# Patient Record
Sex: Female | Born: 1935 | Race: White | Hispanic: No | Smoking: Never smoker
Health system: Southern US, Community
[De-identification: ages and names within clinical notes are randomized; demographics above are authoritative.]

## PROBLEM LIST (undated history)

## (undated) DIAGNOSIS — N6009 Solitary cyst of unspecified breast: Secondary | ICD-10-CM

## (undated) DIAGNOSIS — M797 Fibromyalgia: Secondary | ICD-10-CM

## (undated) DIAGNOSIS — I10 Essential (primary) hypertension: Secondary | ICD-10-CM

## (undated) DIAGNOSIS — C50911 Malignant neoplasm of unspecified site of right female breast: Secondary | ICD-10-CM

## (undated) DIAGNOSIS — E039 Hypothyroidism, unspecified: Secondary | ICD-10-CM

## (undated) DIAGNOSIS — M8580 Other specified disorders of bone density and structure, unspecified site: Secondary | ICD-10-CM

## (undated) DIAGNOSIS — E049 Nontoxic goiter, unspecified: Secondary | ICD-10-CM

## (undated) HISTORY — DX: Fibromyalgia: M79.7

## (undated) HISTORY — PX: BREAST EXCISIONAL BIOPSY: SUR124

## (undated) HISTORY — PX: CATARACT EXTRACTION: SUR2

## (undated) HISTORY — DX: Nontoxic goiter, unspecified: E04.9

## (undated) HISTORY — PX: ROTATOR CUFF REPAIR: SHX139

## (undated) HISTORY — DX: Hypothyroidism, unspecified: E03.9

## (undated) HISTORY — DX: Solitary cyst of unspecified breast: N60.09

## (undated) HISTORY — PX: TUBAL LIGATION: SHX77

## (undated) HISTORY — DX: Other specified disorders of bone density and structure, unspecified site: M85.80

---

## 1998-11-12 ENCOUNTER — Other Ambulatory Visit: Admission: RE | Admit: 1998-11-12 | Discharge: 1998-11-12 | Payer: Self-pay | Admitting: Internal Medicine

## 1999-01-25 ENCOUNTER — Encounter (INDEPENDENT_AMBULATORY_CARE_PROVIDER_SITE_OTHER): Payer: Self-pay | Admitting: *Deleted

## 1999-01-25 ENCOUNTER — Ambulatory Visit (HOSPITAL_COMMUNITY): Admission: RE | Admit: 1999-01-25 | Discharge: 1999-01-25 | Payer: Self-pay | Admitting: Gastroenterology

## 2000-05-11 ENCOUNTER — Other Ambulatory Visit: Admission: RE | Admit: 2000-05-11 | Discharge: 2000-05-11 | Payer: Self-pay | Admitting: Internal Medicine

## 2001-06-11 ENCOUNTER — Other Ambulatory Visit: Admission: RE | Admit: 2001-06-11 | Discharge: 2001-06-11 | Payer: Self-pay | Admitting: Internal Medicine

## 2002-08-06 ENCOUNTER — Other Ambulatory Visit: Admission: RE | Admit: 2002-08-06 | Discharge: 2002-08-06 | Payer: Self-pay | Admitting: Internal Medicine

## 2003-04-04 ENCOUNTER — Emergency Department (HOSPITAL_COMMUNITY): Admission: AD | Admit: 2003-04-04 | Discharge: 2003-04-04 | Payer: Self-pay | Admitting: Family Medicine

## 2004-03-09 ENCOUNTER — Ambulatory Visit: Payer: Self-pay | Admitting: Internal Medicine

## 2004-03-24 ENCOUNTER — Ambulatory Visit: Payer: Self-pay | Admitting: Internal Medicine

## 2004-04-06 ENCOUNTER — Ambulatory Visit: Payer: Self-pay | Admitting: Internal Medicine

## 2004-06-07 ENCOUNTER — Ambulatory Visit (HOSPITAL_COMMUNITY): Admission: RE | Admit: 2004-06-07 | Discharge: 2004-06-07 | Payer: Self-pay | Admitting: Gastroenterology

## 2004-09-26 ENCOUNTER — Ambulatory Visit: Payer: Self-pay | Admitting: Internal Medicine

## 2004-11-30 ENCOUNTER — Ambulatory Visit: Payer: Self-pay | Admitting: Internal Medicine

## 2005-01-11 ENCOUNTER — Ambulatory Visit: Payer: Self-pay | Admitting: Endocrinology

## 2005-01-18 ENCOUNTER — Ambulatory Visit (HOSPITAL_COMMUNITY): Admission: RE | Admit: 2005-01-18 | Discharge: 2005-01-18 | Payer: Self-pay | Admitting: Endocrinology

## 2005-01-25 ENCOUNTER — Ambulatory Visit: Payer: Self-pay | Admitting: Endocrinology

## 2005-02-06 ENCOUNTER — Encounter (INDEPENDENT_AMBULATORY_CARE_PROVIDER_SITE_OTHER): Payer: Self-pay | Admitting: Specialist

## 2005-02-06 ENCOUNTER — Other Ambulatory Visit: Admission: RE | Admit: 2005-02-06 | Discharge: 2005-02-06 | Payer: Self-pay | Admitting: Interventional Radiology

## 2005-02-06 ENCOUNTER — Encounter: Admission: RE | Admit: 2005-02-06 | Discharge: 2005-02-06 | Payer: Self-pay | Admitting: Endocrinology

## 2005-02-20 HISTORY — PX: THYROID SURGERY: SHX805

## 2005-05-01 ENCOUNTER — Encounter (INDEPENDENT_AMBULATORY_CARE_PROVIDER_SITE_OTHER): Payer: Self-pay | Admitting: *Deleted

## 2005-05-01 ENCOUNTER — Ambulatory Visit (HOSPITAL_COMMUNITY): Admission: RE | Admit: 2005-05-01 | Discharge: 2005-05-02 | Payer: Self-pay | Admitting: Surgery

## 2005-05-08 ENCOUNTER — Ambulatory Visit: Payer: Self-pay | Admitting: Endocrinology

## 2005-05-29 ENCOUNTER — Ambulatory Visit: Payer: Self-pay | Admitting: Endocrinology

## 2005-07-20 ENCOUNTER — Ambulatory Visit: Payer: Self-pay | Admitting: Internal Medicine

## 2005-07-27 ENCOUNTER — Ambulatory Visit: Payer: Self-pay | Admitting: Internal Medicine

## 2005-08-11 ENCOUNTER — Encounter: Admission: RE | Admit: 2005-08-11 | Discharge: 2005-08-31 | Payer: Self-pay | Admitting: Internal Medicine

## 2005-10-31 ENCOUNTER — Ambulatory Visit: Payer: Self-pay | Admitting: Internal Medicine

## 2005-11-07 ENCOUNTER — Ambulatory Visit: Payer: Self-pay | Admitting: Internal Medicine

## 2005-11-23 ENCOUNTER — Ambulatory Visit: Payer: Self-pay | Admitting: Internal Medicine

## 2006-01-08 LAB — CONVERTED CEMR LAB: Pap Smear: NORMAL

## 2006-02-20 HISTORY — PX: ENDOMETRIAL BIOPSY: SHX622

## 2006-11-29 ENCOUNTER — Encounter: Payer: Self-pay | Admitting: *Deleted

## 2006-11-29 DIAGNOSIS — M949 Disorder of cartilage, unspecified: Secondary | ICD-10-CM

## 2006-11-29 DIAGNOSIS — M899 Disorder of bone, unspecified: Secondary | ICD-10-CM | POA: Insufficient documentation

## 2006-11-29 DIAGNOSIS — E042 Nontoxic multinodular goiter: Secondary | ICD-10-CM | POA: Insufficient documentation

## 2006-11-29 DIAGNOSIS — M797 Fibromyalgia: Secondary | ICD-10-CM | POA: Insufficient documentation

## 2007-02-12 ENCOUNTER — Emergency Department (HOSPITAL_COMMUNITY): Admission: EM | Admit: 2007-02-12 | Discharge: 2007-02-12 | Payer: Self-pay | Admitting: Family Medicine

## 2007-03-12 ENCOUNTER — Ambulatory Visit: Payer: Self-pay | Admitting: Internal Medicine

## 2007-03-12 DIAGNOSIS — E039 Hypothyroidism, unspecified: Secondary | ICD-10-CM | POA: Insufficient documentation

## 2007-03-12 DIAGNOSIS — S139XXA Sprain of joints and ligaments of unspecified parts of neck, initial encounter: Secondary | ICD-10-CM | POA: Insufficient documentation

## 2007-03-12 DIAGNOSIS — R05 Cough: Secondary | ICD-10-CM

## 2007-03-12 DIAGNOSIS — R059 Cough, unspecified: Secondary | ICD-10-CM | POA: Insufficient documentation

## 2007-03-15 LAB — CONVERTED CEMR LAB
ALT: 21 units/L (ref 0–35)
AST: 20 units/L (ref 0–37)
Albumin: 3.7 g/dL (ref 3.5–5.2)
Alkaline Phosphatase: 55 units/L (ref 39–117)
BUN: 12 mg/dL (ref 6–23)
Basophils Absolute: 0 10*3/uL (ref 0.0–0.1)
Basophils Relative: 0.6 % (ref 0.0–1.0)
Bilirubin Urine: NEGATIVE
Bilirubin, Direct: 0.1 mg/dL (ref 0.0–0.3)
CO2: 31 meq/L (ref 19–32)
Calcium: 9.5 mg/dL (ref 8.4–10.5)
Chloride: 105 meq/L (ref 96–112)
Cholesterol: 161 mg/dL (ref 0–200)
Creatinine, Ser: 0.7 mg/dL (ref 0.4–1.2)
Eosinophils Absolute: 0.2 10*3/uL (ref 0.0–0.6)
Eosinophils Relative: 4.3 % (ref 0.0–5.0)
GFR calc Af Amer: 106 mL/min
GFR calc non Af Amer: 88 mL/min
Glucose, Bld: 97 mg/dL (ref 70–99)
HCT: 40.5 % (ref 36.0–46.0)
HDL: 76.2 mg/dL (ref >39.0)
Hemoglobin, Urine: NEGATIVE
Hemoglobin: 14 g/dL (ref 12.0–15.0)
Ketones, ur: NEGATIVE mg/dL
LDL Cholesterol: 70 mg/dL (ref 0–99)
Leukocytes, UA: NEGATIVE
Lymphocytes Relative: 21.4 % (ref 12.0–46.0)
MCHC: 34.5 g/dL (ref 30.0–36.0)
MCV: 90.4 fL (ref 78.0–100.0)
Monocytes Absolute: 0.2 10*3/uL (ref 0.2–0.7)
Monocytes Relative: 6.8 % (ref 3.0–11.0)
Neutro Abs: 2.4 10*3/uL (ref 1.4–7.7)
Neutrophils Relative %: 66.9 % (ref 43.0–77.0)
Nitrite: NEGATIVE
Platelets: 166 10*3/uL (ref 150–400)
Potassium: 4.3 meq/L (ref 3.5–5.1)
RBC: 4.49 M/uL (ref 3.87–5.11)
RDW: 12.1 % (ref 11.5–14.6)
Sodium: 143 meq/L (ref 135–145)
Specific Gravity, Urine: 1.015 (ref 1.000–1.03)
TSH: 4.42 microintl units/mL (ref 0.35–5.50)
Total Bilirubin: 0.8 mg/dL (ref 0.3–1.2)
Total CHOL/HDL Ratio: 2.1
Total Protein, Urine: NEGATIVE mg/dL
Total Protein: 6.6 g/dL (ref 6.0–8.3)
Triglycerides: 72 mg/dL (ref 0–149)
Urine Glucose: NEGATIVE mg/dL
Urobilinogen, UA: 0.2 (ref 0.0–1.0)
VLDL: 14 mg/dL (ref 0–40)
WBC: 3.5 10*3/uL — ABNORMAL LOW (ref 4.5–10.5)
pH: 7 (ref 5.0–8.0)

## 2007-06-17 ENCOUNTER — Telehealth: Payer: Self-pay | Admitting: Internal Medicine

## 2007-07-03 ENCOUNTER — Encounter: Admission: RE | Admit: 2007-07-03 | Discharge: 2007-09-09 | Payer: Self-pay | Admitting: Internal Medicine

## 2007-07-08 ENCOUNTER — Telehealth: Payer: Self-pay | Admitting: Internal Medicine

## 2008-01-15 ENCOUNTER — Telehealth: Payer: Self-pay | Admitting: Internal Medicine

## 2008-03-26 ENCOUNTER — Ambulatory Visit: Payer: Self-pay | Admitting: Internal Medicine

## 2008-03-31 LAB — CONVERTED CEMR LAB
ALT: 19 units/L (ref 0–35)
AST: 20 units/L (ref 0–37)
Albumin: 3.8 g/dL (ref 3.5–5.2)
Alkaline Phosphatase: 61 units/L (ref 39–117)
BUN: 13 mg/dL (ref 6–23)
Basophils Absolute: 0 10*3/uL (ref 0.0–0.1)
Basophils Relative: 0.4 % (ref 0.0–3.0)
Bilirubin Urine: NEGATIVE
Bilirubin, Direct: 0.1 mg/dL (ref 0.0–0.3)
CO2: 31 meq/L (ref 19–32)
Calcium: 9.4 mg/dL (ref 8.4–10.5)
Chloride: 108 meq/L (ref 96–112)
Cholesterol: 138 mg/dL (ref 0–200)
Creatinine, Ser: 0.6 mg/dL (ref 0.4–1.2)
Eosinophils Absolute: 0.2 10*3/uL (ref 0.0–0.7)
Eosinophils Relative: 5.3 % — ABNORMAL HIGH (ref 0.0–5.0)
GFR calc Af Amer: 127 mL/min
GFR calc non Af Amer: 105 mL/min
Glucose, Bld: 95 mg/dL (ref 70–99)
HCT: 41 % (ref 36.0–46.0)
HDL: 64.4 mg/dL (ref >39.0)
Hemoglobin, Urine: NEGATIVE
Hemoglobin: 13.9 g/dL (ref 12.0–15.0)
Ketones, ur: NEGATIVE mg/dL
LDL Cholesterol: 60 mg/dL (ref 0–99)
Leukocytes, UA: NEGATIVE
Lymphocytes Relative: 25 % (ref 12.0–46.0)
MCHC: 33.9 g/dL (ref 30.0–36.0)
MCV: 91.5 fL (ref 78.0–100.0)
Monocytes Absolute: 0.3 10*3/uL (ref 0.1–1.0)
Monocytes Relative: 7.4 % (ref 3.0–12.0)
Neutro Abs: 2.1 10*3/uL (ref 1.4–7.7)
Neutrophils Relative %: 61.9 % (ref 43.0–77.0)
Nitrite: NEGATIVE
Platelets: 142 10*3/uL — ABNORMAL LOW (ref 150–400)
Potassium: 3.9 meq/L (ref 3.5–5.1)
RBC: 4.48 M/uL (ref 3.87–5.11)
RDW: 12.2 % (ref 11.5–14.6)
Sed Rate: 11 mm/hr (ref 0–22)
Sodium: 144 meq/L (ref 135–145)
Specific Gravity, Urine: 1.01 (ref 1.000–1.03)
TSH: 3.55 microintl units/mL (ref 0.35–5.50)
Total Bilirubin: 0.6 mg/dL (ref 0.3–1.2)
Total CHOL/HDL Ratio: 2.1
Total Protein, Urine: NEGATIVE mg/dL
Total Protein: 6.6 g/dL (ref 6.0–8.3)
Triglycerides: 66 mg/dL (ref 0–149)
Urine Glucose: NEGATIVE mg/dL
Urobilinogen, UA: 0.2 (ref 0.0–1.0)
VLDL: 13 mg/dL (ref 0–40)
WBC: 3.5 10*3/uL — ABNORMAL LOW (ref 4.5–10.5)
pH: 7 (ref 5.0–8.0)

## 2008-04-02 ENCOUNTER — Encounter: Payer: Self-pay | Admitting: Internal Medicine

## 2008-04-02 LAB — CONVERTED CEMR LAB: Vit D, 1,25-Dihydroxy: 23 — ABNORMAL LOW (ref 30–89)

## 2008-04-14 ENCOUNTER — Encounter: Admission: RE | Admit: 2008-04-14 | Discharge: 2008-04-14 | Payer: Self-pay | Admitting: Internal Medicine

## 2008-07-27 ENCOUNTER — Telehealth: Payer: Self-pay | Admitting: Internal Medicine

## 2009-02-15 ENCOUNTER — Telehealth: Payer: Self-pay | Admitting: Internal Medicine

## 2009-04-19 ENCOUNTER — Telehealth: Payer: Self-pay | Admitting: Internal Medicine

## 2009-04-26 ENCOUNTER — Ambulatory Visit: Payer: Self-pay | Admitting: Internal Medicine

## 2009-04-26 DIAGNOSIS — R634 Abnormal weight loss: Secondary | ICD-10-CM | POA: Insufficient documentation

## 2009-04-29 ENCOUNTER — Ambulatory Visit: Payer: Self-pay | Admitting: Internal Medicine

## 2009-08-18 ENCOUNTER — Encounter: Admission: RE | Admit: 2009-08-18 | Discharge: 2009-08-18 | Payer: Self-pay | Admitting: Internal Medicine

## 2009-08-18 LAB — HM MAMMOGRAPHY

## 2010-03-20 LAB — CONVERTED CEMR LAB
ALT: 27 units/L (ref 0–35)
AST: 22 units/L (ref 0–37)
Albumin: 4 g/dL (ref 3.5–5.2)
Alkaline Phosphatase: 62 units/L (ref 39–117)
BUN: 9 mg/dL (ref 6–23)
Basophils Absolute: 0 10*3/uL (ref 0.0–0.1)
Basophils Relative: 0.7 % (ref 0.0–3.0)
Bilirubin Urine: NEGATIVE
Bilirubin, Direct: 0.2 mg/dL (ref 0.0–0.3)
CO2: 32 meq/L (ref 19–32)
Calcium: 9.5 mg/dL (ref 8.4–10.5)
Chloride: 107 meq/L (ref 96–112)
Cholesterol: 170 mg/dL (ref 0–200)
Creatinine, Ser: 0.7 mg/dL (ref 0.4–1.2)
Eosinophils Absolute: 0.1 10*3/uL (ref 0.0–0.7)
Eosinophils Relative: 3.6 % (ref 0.0–5.0)
GFR calc non Af Amer: 87.18 mL/min (ref >60)
Glucose, Bld: 93 mg/dL (ref 70–99)
HCT: 42.2 % (ref 36.0–46.0)
HDL: 90.9 mg/dL (ref >39.00)
Hemoglobin, Urine: NEGATIVE
Hemoglobin: 13.8 g/dL (ref 12.0–15.0)
Ketones, ur: NEGATIVE mg/dL
LDL Cholesterol: 72 mg/dL (ref 0–99)
Leukocytes, UA: NEGATIVE
Lymphocytes Relative: 23.3 % (ref 12.0–46.0)
Lymphs Abs: 0.8 10*3/uL (ref 0.7–4.0)
MCHC: 32.8 g/dL (ref 30.0–36.0)
MCV: 92.5 fL (ref 78.0–100.0)
Monocytes Absolute: 0.3 10*3/uL (ref 0.1–1.0)
Monocytes Relative: 7.5 % (ref 3.0–12.0)
Neutro Abs: 2.2 10*3/uL (ref 1.4–7.7)
Neutrophils Relative %: 64.9 % (ref 43.0–77.0)
Nitrite: NEGATIVE
Platelets: 154 10*3/uL (ref 150.0–400.0)
Potassium: 4.3 meq/L (ref 3.5–5.1)
RBC: 4.56 M/uL (ref 3.87–5.11)
RDW: 12 % (ref 11.5–14.6)
Sodium: 143 meq/L (ref 135–145)
Specific Gravity, Urine: 1.01 (ref 1.000–1.030)
TSH: 3.17 microintl units/mL (ref 0.35–5.50)
Total Bilirubin: 0.7 mg/dL (ref 0.3–1.2)
Total CHOL/HDL Ratio: 2
Total Protein, Urine: NEGATIVE mg/dL
Total Protein: 6.9 g/dL (ref 6.0–8.3)
Triglycerides: 38 mg/dL (ref 0.0–149.0)
Urine Glucose: NEGATIVE mg/dL
Urobilinogen, UA: 0.2 (ref 0.0–1.0)
VLDL: 7.6 mg/dL (ref 0.0–40.0)
WBC: 3.4 10*3/uL — ABNORMAL LOW (ref 4.5–10.5)
pH: 7 (ref 5.0–8.0)

## 2010-03-22 NOTE — Progress Notes (Signed)
Summary: Synthroid  Phone Note Refill Request Message from:  Fax from Pharmacy on April 19, 2009 2:28 PM  Refills Requested: Medication #1:  SYNTHROID 75 MCG  TABS Take 1 tablet by mouth once a day Next Appointment Scheduled: 04-26-09 Initial call taken by: Lucious Groves,  April 19, 2009 2:28 PM    Prescriptions: SYNTHROID 75 MCG  TABS (LEVOTHYROXINE SODIUM) Take 1 tablet by mouth once a day  #30 x 3   Entered by:   Lucious Groves   Authorized by:   Tresa Garter MD   Signed by:   Lucious Groves on 04/19/2009   Method used:   Electronically to        [facility] [id number]       [address]       [place], [place]  [address]       Ph: [phone] or [phone]       Fax: [phone]   RxID:   [id number]

## 2010-03-22 NOTE — Assessment & Plan Note (Signed)
Summary: SHINGLE SHOT/PN/PER PLOT NURSE  Nurse Visit   Allergies: 1)  ! Morphine  Immunizations Administered:  Zostavax # 1:    Vaccine Type: Zostavax    Site: left deltoid    Mfr: Merck    Dose: 0.5 ml    Route: [place]    Given by: Tora Perches    Exp. Date: 01/18/2011    Lot #: [id number]    VIS given: 12/02/04 given April 29, 2009.  Orders Added: 1)  Zoster (Shingles) Vaccine Live [90736] 2)  Admin 1st Vaccine [phone]

## 2010-03-22 NOTE — Assessment & Plan Note (Signed)
Summary: YEARLY FU/ LABS AFTER/ MEDICARE/NWS   Vital Signs:  Patient profile:   76 year old female Height:      62 inches Weight:      125 pounds BMI:     22.95 Temp:     98.5 degrees F oral Pulse rate:   85 / minute BP sitting:   132 / 80  (left arm)  Vitals Entered By: Tora Perches (April 26, 2009 8:41 AM) CC: cpx Is Patient Diabetic? No Comments pt state she  has not had a pap in 3 years,  feels she should have one/vg   CC:  cpx.  History of Present Illness: The patient presents for a wellness examination  Lost wt  after a stomach virus in Dec - all family was sick  Preventive Screening-Counseling & Management  Alcohol-Tobacco     Smoking Status: never  Allergies: 1)  ! 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  The patient denies anorexia, fever, weight gain, vision loss, decreased hearing, hoarseness, chest pain, syncope, dyspnea on exertion, peripheral edema, prolonged cough, headaches, hemoptysis, abdominal pain, melena, hematochezia, severe indigestion/heartburn, hematuria, incontinence, genital sores, muscle weakness, suspicious skin lesions, transient blindness, difficulty walking, depression, unusual weight change, abnormal bleeding, enlarged lymph nodes, angioedema, and breast masses.    Physical Exam  General:  Well-developed,well-nourished,in no acute distress; alert,appropriate and cooperative throughout examination Head:  Normocephalic and atraumatic without obvious  abnormalities. No apparent alopecia or balding. Eyes:  No corneal or conjunctival inflammation noted. EOMI. Perrla. l. Ears:  External ear exam shows no significant lesions or deformities.  Otoscopic examination reveals clear canals, tympanic membranes are intact bilaterally without bulging, retraction, inflammation or discharge. Hearing is grossly normal bilaterally. Nose:  External nasal examination shows no deformity or inflammation. Nasal mucosa are pink and moist without lesions or exudates. Mouth:  Oral mucosa and oropharynx without lesions or exudates.  Teeth in good repair. Lungs:  Normal respiratory effort, chest expands symmetrically. Lungs are clear to auscultation, no crackles or wheezes. Heart:  Normal rate and regular rhythm. S1 and S2 normal without gallop, murmur, click, rub or other extra sounds. Abdomen:  Bowel sounds positive,abdomen soft and non-tender without masses, organomegaly or hernias noted. Msk:  No deformity or scoliosis noted of thoracic or lumbar spine.   Extremities:  No clubbing, cyanosis, edema, or deformity noted with normal full range of motion of all joints.   Neurologic:  No cranial nerve deficits noted. Station and gait are normal. Plantar reflexes are down-going bilaterally. DTRs are symmetrical throughout. Sensory, motor and coordinative functions appear intact. Skin:  Intact without suspicious lesions or rashes Psych:  Cognition and judgment appear intact. Alert and cooperative with normal attention span and concentration. No apparent delusions, illusions, hallucinations   Impression & Recommendations:  Problem # 1:  WELL ADULT EXAM (ICD-V70.0) Assessment New Health and age related issues were discussed. Available screening tests and vaccinations were discussed as well. Healthy life style including good diet and execise was discussed.  Orders: EKG w/  Interpretation (93000) TLB-BMP (Basic Metabolic Panel-BMET) (80048-METABOL) TLB-CBC Platelet -  w/Differential (85025-CBCD) TLB-Hepatic/Liver Function Pnl (80076-HEPATIC) TLB-Lipid Panel (80061-LIPID) TLB-TSH (Thyroid Stimulating Hormone) (84443-TSH) TLB-Udip ONLY (81003-UDIP)  Problem # 2:  WEIGHT LOSS (ICD-783.21) Assessment: New Poss related too an acute GI ilness. Will watch  Problem # 3:  HYPOTHYROIDISM (ICD-244.9) Assessment: Comment Only  Her updated medication list for this problem includes:    Synthroid 75 Mcg Tabs (Levothyroxine sodium) .[name]... Take 1 tablet by mouth once a day  Problem # 4:  FIBROMYALGIA (ICD-729.1)  Complete Medication List: 1)  Synthroid 75 Mcg Tabs (Levothyroxine sodium) .... Take 1 tablet by mouth once a day 2)  Ativan 1 Mg Tabs (Lorazepam) .... Take 1 tablet by mouth two times a day as needed 3)  Vitamin D3 1000 Unit Tabs (Cholecalciferol) .[name].. 1 qd 4)  Zostavax 16109 Unt/0.9ml Solr (Zoster vaccine live) .... As directed, to be administered by pharmacist or at dr's office  Patient Instructions: 1)  Please schedule a follow-up appointment in 3 months. Prescriptions: ATIVAN 1 MG  TABS (LORAZEPAM) Take 1 tablet by mouth two times a day as needed  #60 x 6   Entered and Authorized by:   Tresa Garter MD   Signed by:   Tresa Garter MD on 04/26/2009   Method used:   Print then Give to Patient   RxID:   [id number] SYNTHROID 75 MCG  TABS (LEVOTHYROXINE SODIUM) Take 1 tablet by mouth once a day  #90 x 3   Entered and Authorized by:   Tresa Garter MD   Signed by:   Tresa Garter MD on 04/26/2009   Method used:   Print then Give to Patient   RxID:   [id number]

## 2010-05-10 ENCOUNTER — Other Ambulatory Visit: Payer: Self-pay | Admitting: *Deleted

## 2010-05-10 MED ORDER — LEVOTHYROXINE SODIUM 75 MCG PO TABS: 75.0000 ug | ORAL_TABLET | Freq: Every day | ORAL | Status: DC

## 2010-05-12 ENCOUNTER — Other Ambulatory Visit (INDEPENDENT_AMBULATORY_CARE_PROVIDER_SITE_OTHER): Payer: Medicare Other | Admitting: Internal Medicine

## 2010-05-12 ENCOUNTER — Other Ambulatory Visit (INDEPENDENT_AMBULATORY_CARE_PROVIDER_SITE_OTHER): Payer: Medicare Other

## 2010-05-12 ENCOUNTER — Ambulatory Visit (INDEPENDENT_AMBULATORY_CARE_PROVIDER_SITE_OTHER): Payer: Medicare Other | Admitting: Internal Medicine

## 2010-05-12 ENCOUNTER — Encounter: Payer: Self-pay | Admitting: Internal Medicine

## 2010-05-12 ENCOUNTER — Telehealth: Payer: Self-pay | Admitting: Internal Medicine

## 2010-05-12 DIAGNOSIS — M8580 Other specified disorders of bone density and structure, unspecified site: Secondary | ICD-10-CM

## 2010-05-12 DIAGNOSIS — IMO0001 Reserved for inherently not codable concepts without codable children: Secondary | ICD-10-CM

## 2010-05-12 DIAGNOSIS — M25522 Pain in left elbow: Secondary | ICD-10-CM

## 2010-05-12 DIAGNOSIS — G4700 Insomnia, unspecified: Secondary | ICD-10-CM

## 2010-05-12 DIAGNOSIS — E039 Hypothyroidism, unspecified: Secondary | ICD-10-CM

## 2010-05-12 DIAGNOSIS — M797 Fibromyalgia: Secondary | ICD-10-CM

## 2010-05-12 DIAGNOSIS — Z0000 Encounter for general adult medical examination without abnormal findings: Secondary | ICD-10-CM

## 2010-05-12 DIAGNOSIS — M899 Disorder of bone, unspecified: Secondary | ICD-10-CM

## 2010-05-12 DIAGNOSIS — M25529 Pain in unspecified elbow: Secondary | ICD-10-CM

## 2010-05-12 LAB — CBC WITH DIFFERENTIAL/PLATELET
Basophils Absolute: 0 10*3/uL (ref 0.0–0.1)
Basophils Relative: 0.3 % (ref 0.0–3.0)
Eosinophils Absolute: 0.1 10*3/uL (ref 0.0–0.7)
Eosinophils Relative: 3.2 % (ref 0.0–5.0)
HCT: 40.8 % (ref 36.0–46.0)
Hemoglobin: 14.1 g/dL (ref 12.0–15.0)
Lymphocytes Relative: 24.4 % (ref 12.0–46.0)
Lymphs Abs: 0.9 10*3/uL (ref 0.7–4.0)
MCHC: 34.5 g/dL (ref 30.0–36.0)
MCV: 90.6 fl (ref 78.0–100.0)
Monocytes Absolute: 0.3 10*3/uL (ref 0.1–1.0)
Monocytes Relative: 7.8 % (ref 3.0–12.0)
Neutro Abs: 2.3 10*3/uL (ref 1.4–7.7)
Neutrophils Relative %: 64.3 % (ref 43.0–77.0)
Platelets: 146 10*3/uL — ABNORMAL LOW (ref 150.0–400.0)
RBC: 4.5 Mil/uL (ref 3.87–5.11)
RDW: 13.2 % (ref 11.5–14.6)
WBC: 3.6 10*3/uL — ABNORMAL LOW (ref 4.5–10.5)

## 2010-05-12 LAB — HEPATIC FUNCTION PANEL
ALT: 23 U/L (ref 0–35)
AST: 21 U/L (ref 0–37)
Albumin: 4 g/dL (ref 3.5–5.2)
Alkaline Phosphatase: 59 U/L (ref 39–117)
Bilirubin, Direct: 0.1 mg/dL (ref 0.0–0.3)
Total Bilirubin: 0.7 mg/dL (ref 0.3–1.2)
Total Protein: 6.7 g/dL (ref 6.0–8.3)

## 2010-05-12 LAB — BASIC METABOLIC PANEL
BUN: 14 mg/dL (ref 6–23)
CO2: 31 mEq/L (ref 19–32)
Calcium: 9.3 mg/dL (ref 8.4–10.5)
Chloride: 103 mEq/L (ref 96–112)
Creatinine, Ser: 0.6 mg/dL (ref 0.4–1.2)
GFR: 98.17 mL/min (ref >60.00)
Glucose, Bld: 87 mg/dL (ref 70–99)
Potassium: 4.4 mEq/L (ref 3.5–5.1)
Sodium: 139 mEq/L (ref 135–145)

## 2010-05-12 LAB — TSH: TSH: 2.65 u[IU]/mL (ref 0.35–5.50)

## 2010-05-12 LAB — LIPID PANEL
Cholesterol: 175 mg/dL (ref 0–200)
HDL: 86.3 mg/dL (ref >39.00)
LDL Cholesterol: 76 mg/dL (ref 0–99)
Total CHOL/HDL Ratio: 2
Triglycerides: 62 mg/dL (ref 0.0–149.0)
VLDL: 12.4 mg/dL (ref 0.0–40.0)

## 2010-05-12 LAB — URINALYSIS
Bilirubin Urine: NEGATIVE
Hgb urine dipstick: NEGATIVE
Ketones, ur: NEGATIVE
Leukocytes, UA: NEGATIVE
Nitrite: NEGATIVE
Specific Gravity, Urine: <=1.005 (ref 1.000–1.030)
Total Protein, Urine: NEGATIVE
Urine Glucose: NEGATIVE
Urobilinogen, UA: 0.2 (ref 0.0–1.0)
pH: 7 (ref 5.0–8.0)

## 2010-05-12 MED ORDER — LEVOTHYROXINE SODIUM 75 MCG PO TABS: 75.0000 ug | ORAL_TABLET | Freq: Every day | ORAL | Status: DC

## 2010-05-12 MED ORDER — IBUPROFEN 600 MG PO TABS: ORAL_TABLET | ORAL | Status: AC

## 2010-05-12 MED ORDER — LORAZEPAM 1 MG PO TABS: 1.0000 mg | ORAL_TABLET | Freq: Two times a day (BID) | ORAL | Status: DC | PRN

## 2010-05-12 MED ORDER — IBUPROFEN 600 MG PO TABS: ORAL_TABLET | ORAL | Status: DC

## 2010-05-12 NOTE — Progress Notes (Signed)
Subjective:    Patient ID: Lindsay Maldonado, female    DOB: 1934-02-27, 76 y.o.   MRN: [id number]  HPI  The patient is here for annual Medicare wellness examination and management of other chronic and acute problems.  The risk factors are reflected in the social history. The roster all physicians providing medical care to patient - please, see in the Snapshot section of the chart.  Activities of daily living:  The patient is able 2 prepare food and peds date getting dressed using the toilet morning around from place to place without assistance.     Home safety :    The patient has smoke detectors and wears seatbelts. No firearms at home. No excess sun exposure.      Diet and exercise:  The patient  does exercise.  The need to exercise regularly was discussed. Dietary issues discussed. Healthy diet discussed.      Depression screen :  Over the past 2 weeks, the patient has not felt down, depressed or hopeless. Over the past 2 weeks, the patient hasn't felt little interest or pleasure in doing things.      Past surgical history and problem list:  The following portions of the patient's history were reviewed and updated as appropriate:  allergies, current medications, past family history, past medical history,  past surgical history, past social history  and problem list.  Vision, hearing, body mass index were assessed and reviewed. Cognitive impairment assessment revealed cognition, memory and judgment appear normal.   During the course of the visit the patient was educated and counseled about appropriate screening and preventive services including : fall prevention , diabetes screening, nutrition counseling, vaccination.    C/o L elbow pain x weeks worse with lifting C/o FMS and difficulties sleeping  Review of Systems  Constitutional: Negative.  Negative for fever, chills, diaphoresis, activity change, appetite change, fatigue and unexpected weight change.  HENT: Negative  for hearing loss, ear pain, nosebleeds, congestion, sore throat, facial swelling, rhinorrhea, sneezing, mouth sores, trouble swallowing, neck pain, neck stiffness, postnasal drip, sinus pressure and tinnitus.   Eyes: Negative for pain, discharge, redness, itching and visual disturbance.  Respiratory: Negative for cough, chest tightness, shortness of breath, wheezing and stridor.   Cardiovascular: Negative for chest pain, palpitations and leg swelling.  Gastrointestinal: Negative for nausea, diarrhea, constipation, blood in stool, abdominal distention, anal bleeding and rectal pain.  Genitourinary: Negative for dysuria, urgency, frequency, hematuria, flank pain, vaginal bleeding, vaginal discharge, difficulty urinating, genital sores and pelvic pain.       Menopausal  Musculoskeletal: Positive for myalgias. Negative for back pain, joint swelling, arthralgias and gait problem.  Skin: Negative.  Negative for rash.  Neurological: Negative for dizziness, tremors, seizures, syncope, speech difficulty, weakness, numbness and headaches.  Hematological: Negative for adenopathy. Does not bruise/bleed easily.  Psychiatric/Behavioral: Negative for suicidal ideas, behavioral problems, sleep disturbance, dysphoric mood and decreased concentration. The patient is not nervous/anxious.        Objective:   Physical Exam  Constitutional: She appears well-developed and well-nourished. No distress.  HENT:  Head: Normocephalic.  Right Ear: External ear normal.  Left Ear: External ear normal.  Nose: Nose normal.  Mouth/Throat: Oropharynx is clear and moist.  Eyes: Conjunctivae are normal. Pupils are equal, round, and reactive to light. Right eye exhibits no discharge. Left eye exhibits no discharge.  Neck: Normal range of motion. Neck supple. No JVD present. No tracheal deviation present. No thyromegaly present.  Cardiovascular: Normal rate, regular rhythm  and normal heart sounds.   Pulmonary/Chest: No stridor.  No respiratory distress. She has no wheezes.  Abdominal: Soft. Bowel sounds are normal. She exhibits no distension and no mass. There is no tenderness. There is no rebound and no guarding.  Musculoskeletal: She exhibits tenderness. She exhibits no edema.       L lat epicondyl is tender  Lymphadenopathy:    She has no cervical adenopathy.  Neurological: She displays normal reflexes. No cranial nerve deficit. She exhibits normal muscle tone. Coordination normal.  Skin: No rash noted. No erythema.  Psychiatric: She has a normal mood and affect. Her behavior is normal. Judgment and thought content normal.          Assessment & Plan:    Elbow pain, left Tennis elbow - new. Treatment options discussed Ibuprofen - see Rx. Will inject if not better. Hypothyroidism On meds  Fibromyalgia She refused to try Flexeril or Norrtriptyline at night due to" hangover" type of side effects in the past  Medicare annual wellness visit, subsequent We have discussed and reviewed health maintenance relevant issues

## 2010-05-12 NOTE — Assessment & Plan Note (Signed)
We can try Flexeril or Norrtriptyline at night

## 2010-05-12 NOTE — Assessment & Plan Note (Signed)
We have discussed and reviewed health maintenance relevant issues

## 2010-05-12 NOTE — Telephone Encounter (Deleted)
A for an

## 2010-05-12 NOTE — Assessment & Plan Note (Signed)
Tennis elbow - new. Treatment options discussed

## 2010-05-12 NOTE — Assessment & Plan Note (Signed)
On meds

## 2010-05-12 NOTE — Telephone Encounter (Signed)
Labs reviewed. See note

## 2010-05-13 ENCOUNTER — Telehealth: Payer: Self-pay | Admitting: Internal Medicine

## 2010-05-13 LAB — VITAMIN D 25 HYDROXY (VIT D DEFICIENCY, FRACTURES): Vit D, 25-Hydroxy: 50 ng/mL (ref 30–89)

## 2010-05-13 NOTE — Telephone Encounter (Signed)
The lab work looks good please female the patient a copy of her lab work                              thank you

## 2010-05-13 NOTE — Telephone Encounter (Signed)
Please, let her know labs were nl

## 2010-05-16 NOTE — Telephone Encounter (Signed)
I meant "mail", sorry.Marland KitchenMarland Kitchen

## 2010-05-17 NOTE — Telephone Encounter (Signed)
Copies mailed

## 2010-07-08 NOTE — Op Note (Signed)
[place]. [facility]  Patient:    Lindsay Maldonado                     MRN: [id number] Proc. Date: 01/25/99 Adm. Date:  [id number] Attending:  Nelda Marseille CC:         Petra Kuba, M.D.             Lenon Curt Chilton Si, MD                           Operative Report  PROCEDURE:  Colonoscopy.  INDICATIONS:  Chronic constipation due for colonic screening.  Consent was signed after risks, benefits, medicines and options thoroughly discussed in the office.  MEDICINES USED:  Demerol 40, versed 5.  PROCEDURE:  Rectal inspection is pertinent for small external hemorrhoids. Digital exam was negative.  Pediatric video colonoscope was inserted and fairly easily advanced around the colon to the cecum.  This did require some abdominal pressure but no position changes.  The cecum was identified by the appendiceal orifice in the ileocecal valve.  On slow withdrawal through the colon no abnormalities were seen.  The colon was slightly tortuous particularly in the sigmoid but no polypoid lesions, masses, diverticula or other abnormalities were seen.  Once back in the rectum the scope was retroflexed and pertinent for some internal hemorrhoids.  Scope was straightened and the ______ withdrawn and the scope removed.  The patient tolerated the procedure well.  There was no obvious immediate complication.  ENDOSCOPIC DIAGNOSES: 1. Small internal and external hemorrhoids. 2. Otherwise normal exam to the cecum.  PLAN:  Daily rectals and guaiacs per Dr. Chilton Si.  Continue workup with an esophagogastroduodenoscopy.  Repeat screening in 5-10 years unless symptoms or recommendations change. DD:  01/25/99 TD:  01/26/99 Job: [id number]

## 2010-07-08 NOTE — Op Note (Signed)
NAMECHARLCIE, Lindsay Maldonado             ACCOUNT NO.:  [id number]   MEDICAL RECORD NO.:  [id number]          PATIENT TYPE:  OIB   LOCATION:  1005                         FACILITY[facility]  [facility]   PHYSICIAN:  Lindsay Maldonado, M.D.DATE OF BIRTH:  Sep 16, 1934   DATE OF PROCEDURE:  05/01/2005  DATE OF DISCHARGE:  05/02/2005                                 OPERATIVE REPORT   OFFICE MEDICAL RECORD NUMBER:  [id number].   PREOPERATIVE DIAGNOSIS:  Bilateral thyroid nodules, both showing follicular  lesions on FNA.   OPERATIONS:  Total thyroidectomy.   SURGEON:  Dr. Jamey Ripa   ASSISTANT:  Dr. Abbey Chatters   ANESTHESIA:  General endotracheal.   CLINICAL HISTORY:  Lindsay Maldonado has multiple thyroid nodules, some of which  have calcified, and biopsies have shown follicular lesions.  It was elected  to proceed to total thyroidectomy after a lengthy discussion with the  patient and consultations with her endocrinologist, Dr. Everardo Maldonado.   The patient confirmed the plans for surgery.   She was taken to the operating room and after satisfactory general  endotracheal anesthesia had been obtained, the neck was prepped and draped  as a sterile field.  A transverse incision was outlined using 2-0 silk.  The  cervical incision was made, deepened through the platysma.  Sub-platysma  flaps were raised in the usual fashion.  A self-retaining retractor was  placed.   Prethyroid fascia was opened in the midline.  The right lobe of the thyroid  was exposed with some blunt dissection.  The middle vein was identified,  clipped, and divided.  A couple of tiny vessels coming into the inferior  pole were divided using clips.   I then mobilized the superior pole.  I was able to get just around it right  as the vessels were coming in, divided that staying very close so we did not  catch any extra tissue here.  This was Maldonado ligated in continuity 2-0 silk  and clipped.   With the thyroid rotated medially, I was able to dissect  along the capsule.  I identified what appeared to be both parathyroids and retracted them  laterally.  I stayed right on the capsule and divided small vessels with  clips.  As we were able to dissect in the area of the recurrent laryngeal  nerve, I identified it, and we traced it coming in in its normal location.  It was carefully avoided and appeared intact once we had finished this  portion of the thyroidectomy.   Once it was identified, I was able to divide the remaining small attachments  to the thyroid gland, again using clips to control bleeding.  We then used  the cautery to divide the thyroid off the trachea until I got across the  isthmus.   I placed a little pack in that area of the neck and approached the left  side.  The nodules on the left side were larger, but the thyroidectomy was  approached in the same fashion as on the right.  Again, the vessels of the  superior pole were ligated individually with clips here.  The  nerve was  identified and preserved.  I was not completely clear that I saw 2  parathyroids on this side, but we did see at least what we thought was the  inferior and retracted it laterally.  There is no evidence of any  parathyroid tissue on any of the thyroid that we removed.   Once this was done, we irrigated and made sure everything was dry.  I left a  little FloSeal and Surgicel, both right around the area of the nerve, as  there was some oozing on both sides, but we had gotten that stopped before  applying the FloSeal, but I felt this would be just extra security.   Once everything appeared to be dry, the incision was closed using some 4-0  Vicryl to close the midline fascia, 4-0 Vicryl on Scarpa's, and some staples  and Steri-Strips on the skin.   The patient tolerated the procedure well.  There no operative complications.  Maldonado counts were correct.      Lindsay Maldonado, M.D.  Electronically Signed     CJS/MEDQ  D:  05/01/2005  T:   05/02/2005  Job:  [id number]   cc:   Lindsay Maldonado, M.D. [facility]  [address]  [place]  [place] [address]   Lindsay Maldonado, M.D. [facility]  [address]  [place]  [place] [address]

## 2010-07-08 NOTE — Op Note (Signed)
Lindsay Maldonado, Lindsay Maldonado             ACCOUNT NO.:  [id number]   MEDICAL RECORD NO.:  [id number]          PATIENT TYPE:  AMB   LOCATION:  ENDO                         FACILITY:  [facility]   PHYSICIAN:  Petra Kuba, M.D.    DATE OF BIRTH:  14-Sep-1934   DATE OF PROCEDURE:  06/07/2004  DATE OF DISCHARGE:                                 OPERATIVE REPORT   PROCEDURE PERFORMED:  Colonoscopy.   INDICATIONS:  Screening. Consent was signed after risks, benefits, methods,  options thoroughly discussed in the office in the past and with my nurse.   MEDICINES USED:  Demerol 60, Versed 6.   PROCEDURE:  Rectal inspection is pertinent for external hemorrhoids, small.  Digital exam was negative. Video pediatric adjustable colonoscope was  inserted and fairly easily advanced around the colon to the cecum. This did  require some abdominal pressure but no position changes.  Other than a rare  early left-sided diverticula, no abnormalities were seen.  The cecum was  identified by the appendiceal orifice and ileocecal valve.  If fact, the  scope was inserted a short ways into the terminal ileum which was normal.  Photodocumentation was obtained. Scope was slowly withdrawn. Prep was  adequate. There is some liquid stool that required washing and suction.  On  slow withdrawal through the colon other than the rare early left-sided  diverticula seen on insertion no other abnormalities were seen, specifically  no polyps, tumors or masses. Once back in the rectum, anorectal pull-through  and retroflexion confirmed some small hemorrhoids. Scope was straightened  readvanced a short ways up the left side of the colon. Air was suctioned,  scope removed. The patient tolerated the procedure well. There was no  obvious immediate complication.   ENDOSCOPIC DIAGNOSIS:  1.  Internal-external hemorrhoids.  2.  Rare early left-sided diverticula.  3.  Otherwise within normal limits to the terminal ileum.   PLAN:  Will  recheck colon screening in five to 10 years. Happy to see back  p.r.n.  Otherwise return care to Dr. Posey Rea for the customary health care  maintenance to include yearly rectals and guaiacs.      MEM/MEDQ  D:  06/07/2004  T:  06/07/2004  Job:  [id number]   cc:   Georgina Quint. Plotnikov, M.D. [facility]

## 2010-07-08 NOTE — Procedures (Signed)
[place]. [facility]  Patient:    Lindsay Maldonado                     MRN: [id number] Proc. Date: 01/25/99 Adm. Date:  [id number] Attending:  Nelda Marseille Dictator:   Petra Kuba, M.D. CC:         Lenon Curt. Chilton Si, MD             Doris Cheadle. Lyman Bishop, M.D.                           Procedure Report  PROCEDURE:  Esophagogastroduodenoscopy with biopsy.  INDICATIONS:  The patient with atypical cough rule out reflux as an etiology.  The consent was signed after risks, benefits, methods no acute distress options  were discussed before any premeds given.  No additional medications for this procedure were given since it followed her colonoscopy.  PROCEDURE:  The video endoscope was inserted by direct vision.  The esophagus was fairly unremarkable.  Possibly there was some very minimal reflux change and the distal esophagus was widely patent GE junction.  A small hiatal hernia was noted. The scope was passed through the stomach and then through normal antrum, normal  pylorus and normal duodenal bulb and around the second portion of the duodenum.  The scope was slowly withdrawn back to the bulb and a good look down ruled out ny abnormalities.  The scope was withdrawn back to the stomach and retroflexed. High in the cardia, the hiatal hernia was confirmed.  The fundus, angularis, lesser nd greater curve were evaluated on retroflexed and then straight visualization without additional findings.  The air was suctioned.  The scope was slowly withdrawn back to 20 cm.  Again essentially normal esophagus with maybe some mild reflux change was obtained.  Some distal esophageal biopsies were obtained to rule out any microscopic reflux.  The scope was then slowly withdrawn.  The patient tolerated the procedure well.  There was no immediate complications.  ENDOSCOPIC DIAGNOSIS: 1. Small hiatal hernia. 2. Normal esophagus except for some mild reflux  changes status post    biopsy. 3. Otherwise normal esophagogastroduodenoscopy.  PLAN: Trial of Prilosec 40 mg.  We will see her back in six weeks to see if that is helpful.  Possibly even add Reglan or increase back to her regimen.  Possibly will need 24-hour pH to confirm the diagnosis.  Have her call me sooner or p.r.n. DD:  01/25/99 TD:  01/26/99 Job: [id number]

## 2010-07-08 NOTE — Assessment & Plan Note (Signed)
[facility]                             PRIMARY CARE OFFICE NOTE   NAME:Lindsay Maldonado, Josten                    MRN:          [id number]  DATE:11/07/2005                            DOB:          Nov 20, 1934    HISTORY OF PRESENT ILLNESS:  The patient is a 76 year old female who  presents for wellness examination.   Past medical history, family history, social history as per August 06, 2002,  note.  She is status post thyroidectomy in March 2007.   Current medications reviewed.    Allergies reviewed.   REVIEW OF SYSTEMS:  Last Pap smear in 2006.  No weight loss or shortness of  breath.  No chest pain.  No weight gain.  The rest is negative.  Occasional  abdominal cramps.   PHYSICAL EXAMINATION:  VITAL SIGNS:  Blood pressure 121/75, pulse 66,  temperature 98.8, weight 130 pounds.  GENERAL:  No acute distress.  Looks well.  HEENT:  Moist mucosa.  NECK:  Supple.  No thyromegaly or bruits.  LUNGS:  Clear.  No wheezes or rales.  HEART:  S1, S2.  No murmur or gallops.  ABDOMEN:  Soft, nontender, no organomegaly, no masses felt.  EXTREMITIES:  Lower extremities without edema.  NEUROLOGICAL:  She is alert, oriented and cooperative.  Denies being  depressed.  SKIN:  Clear.   LABORATORY DATA:  On October 31, 2005, CBC normal, cholesterol 140, LDL  69, C-met normal, TSH/free T4 normal, urinalysis normal.   ASSESSMENT/PLAN:  1. Normal wellness examination.  Age/health related issues, discussed.      Health lifestyle discussed.  She has had colonoscopy in 2006.  She had      an EKG chest x-ray prior to her recent thyroid surgery.  Mammogram is a      year late.  She is to take calcium and vitamin D, repeat exam in 12      months.  2. Hypothyroidism, postop, on replacement.  3. Osteopenia, on OsCal.  Will check bone density scan.  4. Occasional abdominal discomfort.  Will get pelvic ultrasound.  5. Zostavax vaccine discussed.      ______________________________  Georgina Quint Plotnikov, MD      AVP/MedQ  DD:  11/16/2005  DT:  11/18/2005  Job #:  [id number]

## 2010-08-18 ENCOUNTER — Other Ambulatory Visit: Payer: Self-pay | Admitting: Internal Medicine

## 2010-08-18 DIAGNOSIS — Z1231 Encounter for screening mammogram for malignant neoplasm of breast: Secondary | ICD-10-CM

## 2010-08-30 ENCOUNTER — Ambulatory Visit
Admission: RE | Admit: 2010-08-30 | Discharge: 2010-08-30 | Disposition: A | Discharge status: Home / Self Care | Payer: Medicare Other | Source: Ambulatory Visit | Attending: Internal Medicine | Admitting: Internal Medicine

## 2010-08-30 DIAGNOSIS — Z1231 Encounter for screening mammogram for malignant neoplasm of breast: Secondary | ICD-10-CM

## 2010-11-07 ENCOUNTER — Other Ambulatory Visit: Payer: Self-pay | Admitting: *Deleted

## 2010-11-07 DIAGNOSIS — E039 Hypothyroidism, unspecified: Secondary | ICD-10-CM

## 2010-11-07 MED ORDER — LEVOTHYROXINE SODIUM 75 MCG PO TABS: 75.0000 ug | ORAL_TABLET | Freq: Every day | ORAL | Status: DC

## 2011-07-31 ENCOUNTER — Other Ambulatory Visit: Payer: Self-pay | Admitting: Internal Medicine

## 2011-07-31 DIAGNOSIS — Z1231 Encounter for screening mammogram for malignant neoplasm of breast: Secondary | ICD-10-CM

## 2011-08-31 ENCOUNTER — Ambulatory Visit
Admission: RE | Admit: 2011-08-31 | Discharge: 2011-08-31 | Disposition: A | Discharge status: Home / Self Care | Payer: Medicare Other | Source: Ambulatory Visit | Attending: Internal Medicine | Admitting: Internal Medicine

## 2011-08-31 DIAGNOSIS — Z1231 Encounter for screening mammogram for malignant neoplasm of breast: Secondary | ICD-10-CM

## 2011-09-04 ENCOUNTER — Encounter: Payer: Self-pay | Admitting: Internal Medicine

## 2011-09-04 ENCOUNTER — Ambulatory Visit (INDEPENDENT_AMBULATORY_CARE_PROVIDER_SITE_OTHER): Payer: Medicare Other | Admitting: Internal Medicine

## 2011-09-04 ENCOUNTER — Other Ambulatory Visit (INDEPENDENT_AMBULATORY_CARE_PROVIDER_SITE_OTHER): Payer: Medicare Other

## 2011-09-04 VITALS — BP 148/90 | HR 68 | Temp 97.8°F | Resp 16 | Ht 63.0 in | Wt 129.0 lb

## 2011-09-04 DIAGNOSIS — G4700 Insomnia, unspecified: Secondary | ICD-10-CM

## 2011-09-04 DIAGNOSIS — IMO0001 Reserved for inherently not codable concepts without codable children: Secondary | ICD-10-CM

## 2011-09-04 DIAGNOSIS — Z0000 Encounter for general adult medical examination without abnormal findings: Secondary | ICD-10-CM

## 2011-09-04 DIAGNOSIS — R030 Elevated blood-pressure reading, without diagnosis of hypertension: Secondary | ICD-10-CM

## 2011-09-04 DIAGNOSIS — Z136 Encounter for screening for cardiovascular disorders: Secondary | ICD-10-CM

## 2011-09-04 DIAGNOSIS — I10 Essential (primary) hypertension: Secondary | ICD-10-CM | POA: Insufficient documentation

## 2011-09-04 DIAGNOSIS — R634 Abnormal weight loss: Secondary | ICD-10-CM

## 2011-09-04 DIAGNOSIS — E039 Hypothyroidism, unspecified: Secondary | ICD-10-CM

## 2011-09-04 LAB — BASIC METABOLIC PANEL
Chloride: 106 mEq/L (ref 96–112)
Creatinine, Ser: 0.6 mg/dL (ref 0.4–1.2)
GFR: 97.82 mL/min (ref >60.00)
Potassium: 4.3 mEq/L (ref 3.5–5.1)

## 2011-09-04 LAB — CBC WITH DIFFERENTIAL/PLATELET
Basophils Absolute: 0 10*3/uL (ref 0.0–0.1)
Eosinophils Absolute: 0.2 10*3/uL (ref 0.0–0.7)
Lymphocytes Relative: 23.8 % (ref 12.0–46.0)
MCHC: 33.2 g/dL (ref 30.0–36.0)
Monocytes Absolute: 0.3 10*3/uL (ref 0.1–1.0)
Neutro Abs: 2.5 10*3/uL (ref 1.4–7.7)
Neutrophils Relative %: 64.6 % (ref 43.0–77.0)
RDW: 13.1 % (ref 11.5–14.6)

## 2011-09-04 LAB — LIPID PANEL
Cholesterol: 166 mg/dL (ref 0–200)
LDL Cholesterol: 69 mg/dL (ref 0–99)
Triglycerides: 44 mg/dL (ref 0.0–149.0)

## 2011-09-04 LAB — URINALYSIS
Bilirubin Urine: NEGATIVE
Hgb urine dipstick: NEGATIVE
Ketones, ur: NEGATIVE
Total Protein, Urine: NEGATIVE
Urine Glucose: NEGATIVE
Urobilinogen, UA: 0.2 (ref 0.0–1.0)

## 2011-09-04 LAB — HEPATIC FUNCTION PANEL
ALT: 24 U/L (ref 0–35)
AST: 22 U/L (ref 0–37)
Albumin: 3.9 g/dL (ref 3.5–5.2)
Alkaline Phosphatase: 61 U/L (ref 39–117)
Total Protein: 6.9 g/dL (ref 6.0–8.3)

## 2011-09-04 MED ORDER — LEVOTHYROXINE SODIUM 75 MCG PO TABS: 75.0000 ug | ORAL_TABLET | Freq: Every day | ORAL | Status: DC

## 2011-09-04 MED ORDER — LORAZEPAM 1 MG PO TABS: 1.0000 mg | ORAL_TABLET | Freq: Two times a day (BID) | ORAL | Status: DC | PRN

## 2011-09-04 NOTE — Assessment & Plan Note (Signed)
Mild, nl BP at home

## 2011-09-04 NOTE — Assessment & Plan Note (Signed)
Wt Readings from Last 3 Encounters:  09/04/11 129 lb (58.514 kg)  05/12/10 126 lb (57.153 kg)  04/26/09 125 lb (56.7 kg)

## 2011-09-04 NOTE — Assessment & Plan Note (Signed)
Continue with current prescription therapy as reflected on the Med list.  

## 2011-09-04 NOTE — Progress Notes (Signed)
Subjective:    Patient ID: Lindsay Maldonado, female    DOB: [birth year], 77 y.o.   MRN: [id number]  HPI  The patient is here for annual Medicare wellness examination and management of other chronic and acute problems.  The risk factors are reflected in the social history. The roster all physicians providing medical care to patient - please, see in the Snapshot section of the chart.  Activities of daily living:  The patient is able 2 prepare food and peds date getting dressed using the toilet morning around from place to place without assistance.     Home safety :    The patient has smoke detectors and wears seatbelts. No firearms at home. No excess sun exposure.      Diet and exercise:  The patient  does exercise.  The need to exercise regularly was discussed. Dietary issues discussed. Healthy diet discussed.      Depression screen :  Over the past 2 weeks, the patient has not felt down, depressed or hopeless. Over the past 2 weeks, the patient hasn't felt little interest or pleasure in doing things.      Past surgical history and problem list:  The following portions of the patient's history were reviewed and updated as appropriate:  allergies, current medications, past family history, past medical history,  past surgical history, past social history  and problem list.  Vision, hearing, body mass index were assessed and reviewed. Cognitive impairment assessment revealed cognition, memory and judgment appear normal.   During the course of the visit the patient was educated and counseled about appropriate screening and preventive services including : fall prevention , diabetes screening, nutrition counseling, vaccination.    C/o L shoulder pain x mo worse at night 4/10 or less C/o FMS and difficulties sleeping  BP Readings from Last 3 Encounters:  09/04/11 148/90  05/12/10 140/90  04/26/09 132/80   Wt Readings from Last 3 Encounters:  09/04/11 129 lb (58.514 kg)    05/12/10 126 lb (57.153 kg)  04/26/09 125 lb (56.7 kg)      Review of Systems  Constitutional: Negative.  Negative for fever, chills, diaphoresis, activity change, appetite change, fatigue and unexpected weight change.  HENT: Negative for hearing loss, ear pain, nosebleeds, congestion, sore throat, facial swelling, rhinorrhea, sneezing, mouth sores, trouble swallowing, neck pain, neck stiffness, postnasal drip, sinus pressure and tinnitus.   Eyes: Negative for pain, discharge, redness, itching and visual disturbance.  Respiratory: Negative for cough, chest tightness, shortness of breath, wheezing and stridor.   Cardiovascular: Negative for chest pain, palpitations and leg swelling.  Gastrointestinal: Negative for nausea, diarrhea, constipation, blood in stool, abdominal distention, anal bleeding and rectal pain.  Genitourinary: Negative for dysuria, urgency, frequency, hematuria, flank pain, vaginal bleeding, vaginal discharge, difficulty urinating, genital sores and pelvic pain.       Menopausal  Musculoskeletal: Positive for myalgias. Negative for back pain, joint swelling, arthralgias and gait problem.  Skin: Negative.  Negative for rash.  Neurological: Negative for dizziness, tremors, seizures, syncope, speech difficulty, weakness, numbness and headaches.  Hematological: Negative for adenopathy. Does not bruise/bleed easily.  Psychiatric/Behavioral: Negative for suicidal ideas, behavioral problems, disturbed wake/sleep cycle, dysphoric mood and decreased concentration. The patient is not nervous/anxious.        Objective:   Physical Exam  Constitutional: She appears well-developed and well-nourished. No distress.  HENT:  Head: Normocephalic.  Right Ear: External ear normal.  Left Ear: External ear normal.  Nose: Nose normal.  Mouth/Throat:  Oropharynx is clear and moist.  Eyes: Conjunctivae are normal. Pupils are equal, round, and reactive to light. Right eye exhibits no  discharge. Left eye exhibits no discharge.  Neck: Normal range of motion. Neck supple. No JVD present. No tracheal deviation present. No thyromegaly present.  Cardiovascular: Normal rate, regular rhythm and normal heart sounds.   Pulmonary/Chest: No stridor. No respiratory distress. She has no wheezes.  Abdominal: Soft. Bowel sounds are normal. She exhibits no distension and no mass. There is no tenderness. There is no rebound and no guarding.  Musculoskeletal: She exhibits tenderness. She exhibits no edema.       L lat epicondyl is tender  Lymphadenopathy:    She has no cervical adenopathy.  Neurological: She displays normal reflexes. No cranial nerve deficit. She exhibits normal muscle tone. Coordination normal.  Skin: No rash noted. No erythema.  Psychiatric: She has a normal mood and affect. Her behavior is normal. Judgment and thought content normal.       Lab Results  Component Value Date   WBC 3.6* 05/12/2010   HGB 14.1 05/12/2010   HCT 40.8 05/12/2010   PLT 146.0* 05/12/2010   GLUCOSE 87 05/12/2010   CHOL 175 05/12/2010   TRIG 62.0 05/12/2010   HDL 86.30 05/12/2010   LDLCALC 76 05/12/2010   ALT 23 05/12/2010   AST 21 05/12/2010   NA 139 05/12/2010   K 4.4 05/12/2010   CL 103 05/12/2010   CREATININE 0.6 05/12/2010   BUN 14 05/12/2010   CO2 31 05/12/2010   TSH 2.65 05/12/2010      Assessment & Plan:

## 2011-09-04 NOTE — Assessment & Plan Note (Signed)

## 2012-07-25 ENCOUNTER — Other Ambulatory Visit: Payer: Self-pay

## 2012-07-25 DIAGNOSIS — Z1231 Encounter for screening mammogram for malignant neoplasm of breast: Secondary | ICD-10-CM

## 2012-08-01 ENCOUNTER — Telehealth: Payer: Self-pay | Admitting: *Deleted

## 2012-08-01 DIAGNOSIS — Z0000 Encounter for general adult medical examination without abnormal findings: Secondary | ICD-10-CM

## 2012-08-01 NOTE — Telephone Encounter (Signed)
CPE labs entered.  

## 2012-08-01 NOTE — Telephone Encounter (Signed)
Message copied by Merrilyn Puma on Thu Aug 01, 2012  1:52 PM ------      Message from: Etheleen Sia      Created: Thu Jul 25, 2012  1:04 PM      Regarding: LABS       PHYSICAL LABS IN July - SHE WANTS TO ADD A VIT D LAB ------

## 2012-09-05 ENCOUNTER — Ambulatory Visit
Admission: RE | Admit: 2012-09-05 | Discharge: 2012-09-05 | Disposition: A | Discharge status: Home / Self Care | Payer: Medicare Other | Source: Ambulatory Visit

## 2012-09-05 ENCOUNTER — Encounter: Payer: Self-pay | Admitting: Internal Medicine

## 2012-09-05 ENCOUNTER — Ambulatory Visit (INDEPENDENT_AMBULATORY_CARE_PROVIDER_SITE_OTHER): Payer: Medicare Other | Admitting: Internal Medicine

## 2012-09-05 ENCOUNTER — Ambulatory Visit (INDEPENDENT_AMBULATORY_CARE_PROVIDER_SITE_OTHER)
Admission: RE | Admit: 2012-09-05 | Discharge: 2012-09-05 | Disposition: A | Discharge status: Home / Self Care | Payer: Medicare Other | Source: Ambulatory Visit | Attending: Internal Medicine | Admitting: Internal Medicine

## 2012-09-05 ENCOUNTER — Other Ambulatory Visit (INDEPENDENT_AMBULATORY_CARE_PROVIDER_SITE_OTHER): Payer: Medicare Other

## 2012-09-05 VITALS — BP 140/80 | HR 72 | Temp 98.1°F | Resp 16 | Ht 62.0 in | Wt 126.0 lb

## 2012-09-05 DIAGNOSIS — IMO0001 Reserved for inherently not codable concepts without codable children: Secondary | ICD-10-CM

## 2012-09-05 DIAGNOSIS — R030 Elevated blood-pressure reading, without diagnosis of hypertension: Secondary | ICD-10-CM

## 2012-09-05 DIAGNOSIS — L570 Actinic keratosis: Secondary | ICD-10-CM

## 2012-09-05 DIAGNOSIS — Z0000 Encounter for general adult medical examination without abnormal findings: Secondary | ICD-10-CM

## 2012-09-05 DIAGNOSIS — R059 Cough, unspecified: Secondary | ICD-10-CM

## 2012-09-05 DIAGNOSIS — E785 Hyperlipidemia, unspecified: Secondary | ICD-10-CM

## 2012-09-05 DIAGNOSIS — E559 Vitamin D deficiency, unspecified: Secondary | ICD-10-CM

## 2012-09-05 DIAGNOSIS — R071 Chest pain on breathing: Secondary | ICD-10-CM

## 2012-09-05 DIAGNOSIS — R05 Cough: Secondary | ICD-10-CM

## 2012-09-05 DIAGNOSIS — E039 Hypothyroidism, unspecified: Secondary | ICD-10-CM

## 2012-09-05 DIAGNOSIS — G4700 Insomnia, unspecified: Secondary | ICD-10-CM

## 2012-09-05 DIAGNOSIS — R0789 Other chest pain: Secondary | ICD-10-CM | POA: Insufficient documentation

## 2012-09-05 DIAGNOSIS — Z1231 Encounter for screening mammogram for malignant neoplasm of breast: Secondary | ICD-10-CM

## 2012-09-05 LAB — BASIC METABOLIC PANEL
Chloride: 104 mEq/L (ref 96–112)
Creatinine, Ser: 0.7 mg/dL (ref 0.4–1.2)
Potassium: 4.1 mEq/L (ref 3.5–5.1)

## 2012-09-05 LAB — CBC WITH DIFFERENTIAL/PLATELET
Basophils Relative: 0.3 % (ref 0.0–3.0)
Eosinophils Relative: 2.5 % (ref 0.0–5.0)
MCV: 90.1 fl (ref 78.0–100.0)
Monocytes Absolute: 0.3 10*3/uL (ref 0.1–1.0)
Monocytes Relative: 7.3 % (ref 3.0–12.0)
Neutrophils Relative %: 67.3 % (ref 43.0–77.0)
RBC: 4.78 Mil/uL (ref 3.87–5.11)
WBC: 4.4 10*3/uL — ABNORMAL LOW (ref 4.5–10.5)

## 2012-09-05 LAB — HEPATIC FUNCTION PANEL
ALT: 18 U/L (ref 0–35)
AST: 17 U/L (ref 0–37)
Alkaline Phosphatase: 60 U/L (ref 39–117)
Bilirubin, Direct: 0.1 mg/dL (ref 0.0–0.3)
Total Protein: 6.8 g/dL (ref 6.0–8.3)

## 2012-09-05 LAB — URINALYSIS
Bilirubin Urine: NEGATIVE
Ketones, ur: NEGATIVE
Specific Gravity, Urine: 1.015 (ref 1.000–1.030)
Urine Glucose: NEGATIVE
Urobilinogen, UA: 0.2 (ref 0.0–1.0)

## 2012-09-05 LAB — TSH: TSH: 1.88 u[IU]/mL (ref 0.35–5.50)

## 2012-09-05 MED ORDER — LEVOTHYROXINE SODIUM 75 MCG PO TABS: 75.0000 ug | ORAL_TABLET | Freq: Every day | ORAL | Status: DC

## 2012-09-05 MED ORDER — LORAZEPAM 1 MG PO TABS: 1.0000 mg | ORAL_TABLET | Freq: Two times a day (BID) | ORAL | Status: DC | PRN

## 2012-09-05 NOTE — Assessment & Plan Note (Signed)

## 2012-09-05 NOTE — Patient Instructions (Signed)
   Postprocedure instructions :     Keep the wounds clean. You can wash them with liquid soap and water. Pat dry with gauze or a Kleenex tissue  Before applying antibiotic ointment and a Band-Aid.   You need to report immediately  if  any signs of infection develop.    

## 2012-09-05 NOTE — Assessment & Plan Note (Signed)
Prn meds, stretching

## 2012-09-05 NOTE — Assessment & Plan Note (Signed)
EKG, CXR, mammo Breast exam was ok

## 2012-09-05 NOTE — Progress Notes (Signed)
Subjective:    HPI  The patient is here for annual Medicare wellness examination and management of other chronic and acute problems.  The risk factors are reflected in the social history. The roster all physicians providing medical care to patient - please, see in the Snapshot section of the chart.  Activities of daily living:  The patient is able 2 prepare food and peds date getting dressed using the toilet morning around from place to place without assistance.     Home safety :    The patient has smoke detectors and wears seatbelts. No firearms at home. No excess sun exposure.      Diet and exercise:  The patient  does exercise.  The need to exercise regularly was discussed. Dietary issues discussed. Healthy diet discussed.      Depression screen :  Over the past 2 weeks, the patient has not felt down, depressed or hopeless. Over the past 2 weeks, the patient hasn't felt little interest or pleasure in doing things.      Past surgical history and problem list:  The following portions of the patient's history were reviewed and updated as appropriate:  allergies, current medications, past family history, past medical history,  past surgical history, past social history  and problem list.  Vision, hearing, body mass index were assessed and reviewed. Cognitive impairment assessment revealed cognition, memory and judgment appear normal.   During the course of the visit the patient was educated and counseled about appropriate screening and preventive services including : fall prevention , diabetes screening, nutrition counseling, vaccination.    C/o L shoulder pain x mo worse at night 4/10 or less. Not better. C/o L CP at times It is not worse w/activity  C/o FMS and difficulties sleeping  C/o skin lesions C/o cough sometimes   BP Readings from Last 3 Encounters:  09/05/12 140/80  09/04/11 148/90  05/12/10 140/90   Wt Readings from Last 3 Encounters:  09/05/12 126  lb (57.153 kg)  09/04/11 129 lb (58.514 kg)  05/12/10 126 lb (57.153 kg)      Review of Systems  Constitutional: Negative.  Negative for fever, chills, diaphoresis, activity change, appetite change, fatigue and unexpected weight change.  HENT: Negative for hearing loss, ear pain, nosebleeds, congestion, sore throat, facial swelling, rhinorrhea, sneezing, mouth sores, trouble swallowing, neck pain, neck stiffness, postnasal drip, sinus pressure and tinnitus.   Eyes: Negative for pain, discharge, redness, itching and visual disturbance.  Respiratory: Negative for cough, chest tightness, shortness of breath, wheezing and stridor.   Cardiovascular: Negative for chest pain, palpitations and leg swelling.  Gastrointestinal: Negative for nausea, diarrhea, constipation, blood in stool, abdominal distention, anal bleeding and rectal pain.  Genitourinary: Negative for dysuria, urgency, frequency, hematuria, flank pain, vaginal bleeding, vaginal discharge, difficulty urinating, genital sores and pelvic pain.       Menopausal  Musculoskeletal: Positive for myalgias. Negative for back pain, joint swelling, arthralgias and gait problem.  Skin: Negative.  Negative for rash.  Neurological: Negative for dizziness, tremors, seizures, syncope, speech difficulty, weakness, numbness and headaches.  Hematological: Negative for adenopathy. Does not bruise/bleed easily.  Psychiatric/Behavioral: Negative for suicidal ideas, behavioral problems, sleep disturbance, dysphoric mood and decreased concentration. The patient is not nervous/anxious.        Objective:   Physical Exam  Constitutional: She appears well-developed and well-nourished. No distress.  HENT:  Head: Normocephalic.  Right Ear: External ear normal.  Left Ear: External ear normal.  Nose: Nose normal.  Mouth/Throat: Oropharynx is clear and moist.  Eyes: Conjunctivae are normal. Pupils are equal, round, and reactive to light. Right eye exhibits no  discharge. Left eye exhibits no discharge.  Neck: Normal range of motion. Neck supple. No JVD present. No tracheal deviation present. No thyromegaly present.  Cardiovascular: Normal rate, regular rhythm and normal heart sounds.   Pulmonary/Chest: No stridor. No respiratory distress. She has no wheezes.  Abdominal: Soft. Bowel sounds are normal. She exhibits no distension and no mass. There is no tenderness. There is no rebound and no guarding.  Musculoskeletal: She exhibits tenderness. She exhibits no edema.  L lat epicondyl is tender  Lymphadenopathy:    She has no cervical adenopathy.  Neurological: She displays normal reflexes. No cranial nerve deficit. She exhibits normal muscle tone. Coordination normal.  Skin: No rash noted. No erythema.  Psychiatric: She has a normal mood and affect. Her behavior is normal. Judgment and thought content normal.   B breasts are NT w/mild fibrocystic changes  AKs x 2 on L forearm and 1 on face    Lab Results  Component Value Date   WBC 3.9* 09/04/2011   HGB 13.8 09/04/2011   HCT 41.5 09/04/2011   PLT 152.0 09/04/2011   GLUCOSE 94 09/04/2011   CHOL 166 09/04/2011   TRIG 44.0 09/04/2011   HDL 88.10 09/04/2011   LDLCALC 69 09/04/2011   ALT 24 09/04/2011   AST 22 09/04/2011   NA 142 09/04/2011   K 4.3 09/04/2011   CL 106 09/04/2011   CREATININE 0.6 09/04/2011   BUN 14 09/04/2011   CO2 30 09/04/2011   TSH 3.40 09/04/2011    Procedure Note :     Procedure : Cryosurgery   Indication:Actinic keratosis(es)   Risks including unsuccessful procedure , bleeding, infection, bruising, scar, a need for a repeat  procedure and others were explained to the patient in detail as well as the benefits. Informed consent was obtained verbally.   x 2 on L forearm and 1 on face was/were treated with liquid nitrogen on a Q-tip in a usual fasion . Band-Aid was applied and antibiotic ointment was given for a later use.   Tolerated well. Complications none.        Assessment & Plan:

## 2012-09-05 NOTE — Assessment & Plan Note (Signed)
cryo 

## 2012-09-05 NOTE — Assessment & Plan Note (Signed)
CXR

## 2012-09-05 NOTE — Assessment & Plan Note (Signed)
Mild, nl BP at home  BP Readings from Last 3 Encounters:  09/05/12 140/80  09/04/11 148/90  05/12/10 140/90

## 2012-09-07 ENCOUNTER — Encounter: Payer: Self-pay | Admitting: Internal Medicine

## 2012-09-10 ENCOUNTER — Other Ambulatory Visit: Payer: Self-pay | Admitting: *Deleted

## 2012-09-10 ENCOUNTER — Other Ambulatory Visit (INDEPENDENT_AMBULATORY_CARE_PROVIDER_SITE_OTHER): Payer: Medicare Other

## 2012-09-10 DIAGNOSIS — E559 Vitamin D deficiency, unspecified: Secondary | ICD-10-CM

## 2012-09-10 DIAGNOSIS — E785 Hyperlipidemia, unspecified: Secondary | ICD-10-CM

## 2012-09-10 LAB — LIPID PANEL
LDL Cholesterol: 82 mg/dL (ref 0–99)
Total CHOL/HDL Ratio: 2
Triglycerides: 96 mg/dL (ref 0.0–149.0)

## 2012-09-11 ENCOUNTER — Other Ambulatory Visit: Payer: Self-pay | Admitting: Internal Medicine

## 2012-12-26 ENCOUNTER — Other Ambulatory Visit: Payer: Self-pay

## 2013-08-06 ENCOUNTER — Other Ambulatory Visit: Payer: Self-pay

## 2013-08-06 DIAGNOSIS — Z1231 Encounter for screening mammogram for malignant neoplasm of breast: Secondary | ICD-10-CM

## 2013-09-25 ENCOUNTER — Other Ambulatory Visit (INDEPENDENT_AMBULATORY_CARE_PROVIDER_SITE_OTHER): Payer: Medicare Other

## 2013-09-25 ENCOUNTER — Ambulatory Visit
Admission: RE | Admit: 2013-09-25 | Discharge: 2013-09-25 | Disposition: A | Discharge status: Home / Self Care | Payer: Medicare Other | Source: Ambulatory Visit

## 2013-09-25 ENCOUNTER — Encounter: Payer: Self-pay | Admitting: Internal Medicine

## 2013-09-25 ENCOUNTER — Ambulatory Visit (INDEPENDENT_AMBULATORY_CARE_PROVIDER_SITE_OTHER): Payer: Medicare Other | Admitting: Internal Medicine

## 2013-09-25 VITALS — BP 137/80 | HR 74 | Temp 98.0°F | Ht 63.0 in | Wt 129.0 lb

## 2013-09-25 DIAGNOSIS — IMO0001 Reserved for inherently not codable concepts without codable children: Secondary | ICD-10-CM

## 2013-09-25 DIAGNOSIS — R634 Abnormal weight loss: Secondary | ICD-10-CM

## 2013-09-25 DIAGNOSIS — E785 Hyperlipidemia, unspecified: Secondary | ICD-10-CM

## 2013-09-25 DIAGNOSIS — Z0000 Encounter for general adult medical examination without abnormal findings: Secondary | ICD-10-CM

## 2013-09-25 DIAGNOSIS — E039 Hypothyroidism, unspecified: Secondary | ICD-10-CM

## 2013-09-25 DIAGNOSIS — Z1231 Encounter for screening mammogram for malignant neoplasm of breast: Secondary | ICD-10-CM

## 2013-09-25 DIAGNOSIS — Z23 Encounter for immunization: Secondary | ICD-10-CM

## 2013-09-25 LAB — TSH: TSH: 3.01 u[IU]/mL (ref 0.35–4.50)

## 2013-09-25 LAB — URINALYSIS
Bilirubin Urine: NEGATIVE
HGB URINE DIPSTICK: NEGATIVE
Ketones, ur: NEGATIVE
LEUKOCYTES UA: NEGATIVE
NITRITE: NEGATIVE
Specific Gravity, Urine: <=1.005 — AB (ref 1.000–1.030)
Total Protein, Urine: NEGATIVE
Urine Glucose: NEGATIVE
Urobilinogen, UA: 0.2 (ref 0.0–1.0)
pH: 7.5 (ref 5.0–8.0)

## 2013-09-25 LAB — CBC WITH DIFFERENTIAL/PLATELET
Basophils Absolute: 0 10*3/uL (ref 0.0–0.1)
Basophils Relative: 0.3 % (ref 0.0–3.0)
EOS ABS: 0.1 10*3/uL (ref 0.0–0.7)
EOS PCT: 3.4 % (ref 0.0–5.0)
HEMATOCRIT: 41.3 % (ref 36.0–46.0)
Hemoglobin: 14 g/dL (ref 12.0–15.0)
LYMPHS ABS: 1 10*3/uL (ref 0.7–4.0)
Lymphocytes Relative: 24 % (ref 12.0–46.0)
MCHC: 34 g/dL (ref 30.0–36.0)
MCV: 87.7 fl (ref 78.0–100.0)
Monocytes Absolute: 0.3 10*3/uL (ref 0.1–1.0)
Monocytes Relative: 7 % (ref 3.0–12.0)
Neutro Abs: 2.7 10*3/uL (ref 1.4–7.7)
Neutrophils Relative %: 65.3 % (ref 43.0–77.0)
PLATELETS: 165 10*3/uL (ref 150.0–400.0)
RBC: 4.71 Mil/uL (ref 3.87–5.11)
RDW: 13 % (ref 11.5–15.5)
WBC: 4.2 10*3/uL (ref 4.0–10.5)

## 2013-09-25 LAB — LIPID PANEL
Cholesterol: 177 mg/dL (ref 0–200)
HDL: 75.2 mg/dL (ref >39.00)
LDL Cholesterol: 90 mg/dL (ref 0–99)
NonHDL: 101.8
TRIGLYCERIDES: 58 mg/dL (ref 0.0–149.0)
Total CHOL/HDL Ratio: 2
VLDL: 11.6 mg/dL (ref 0.0–40.0)

## 2013-09-25 LAB — BASIC METABOLIC PANEL
BUN: 11 mg/dL (ref 6–23)
CHLORIDE: 104 meq/L (ref 96–112)
CO2: 28 meq/L (ref 19–32)
CREATININE: 0.7 mg/dL (ref 0.4–1.2)
Calcium: 9.5 mg/dL (ref 8.4–10.5)
GFR: 92.2 mL/min (ref >60.00)
GLUCOSE: 86 mg/dL (ref 70–99)
Potassium: 3.9 mEq/L (ref 3.5–5.1)
Sodium: 140 mEq/L (ref 135–145)

## 2013-09-25 LAB — HEPATIC FUNCTION PANEL
ALT: 19 U/L (ref 0–35)
AST: 20 U/L (ref 0–37)
Albumin: 4 g/dL (ref 3.5–5.2)
Alkaline Phosphatase: 60 U/L (ref 39–117)
Bilirubin, Direct: 0.1 mg/dL (ref 0.0–0.3)
TOTAL PROTEIN: 6.9 g/dL (ref 6.0–8.3)
Total Bilirubin: 0.5 mg/dL (ref 0.2–1.2)

## 2013-09-25 LAB — SEDIMENTATION RATE: Sed Rate: 10 mm/hr (ref 0–22)

## 2013-09-25 MED ORDER — LEVOTHYROXINE SODIUM 75 MCG PO TABS: 75.0000 ug | ORAL_TABLET | Freq: Every day | ORAL | Status: DC

## 2013-09-25 NOTE — Addendum Note (Signed)
Addended by: Cresenciano Lick on: 09/25/2013 03:03 PM   Modules accepted: Orders

## 2013-09-25 NOTE — Progress Notes (Signed)
Pre visit review using our clinic review tool, if applicable. No additional management support is needed unless otherwise documented below in the visit note. 

## 2013-09-25 NOTE — Assessment & Plan Note (Signed)
ESR

## 2013-09-25 NOTE — Assessment & Plan Note (Signed)
Here for medicare wellness/physical  Diet: heart healthy  Physical activity: not sedentary  Depression/mood screen: negative  Hearing: intact to whispered voice  Visual acuity: grossly normal, performs annual eye exam  ADLs: capable  Fall risk: none  Home safety: good  Cognitive evaluation: intact to orientation, naming, recall and repetition  EOL planning: adv directives, full code/ I agree  I have personally reviewed and have noted  1. The patient's medical and social history  2. Their use of alcohol, tobacco or illicit drugs  3. Their current medications and supplements  4. The patient's functional ability including ADL's, fall risks, home safety risks and hearing or visual impairment.  5. Diet and physical activities  6. Evidence for depression or mood disorders    Today patient counseled on age appropriate routine health concerns for screening and prevention, each reviewed and up to date or declined. Immunizations reviewed and up to date or declined. Labs ordered and reviewed. Risk factors for depression reviewed and negative. Hearing function and visual acuity are intact. ADLs screened and addressed as needed. Functional ability and level of safety reviewed and appropriate. Education, counseling and referrals performed based on assessed risks today. Patient provided with a copy of personalized plan for preventive services.  Cologuard discussed

## 2013-09-25 NOTE — Progress Notes (Signed)
Subjective:    HPI  The patient is here for annual Medicare wellness examination and management of other chronic and acute problems.   The risk factors are reflected in the social history. The roster all physicians providing medical care to patient - please, see in the Snapshot section of the chart.  Activities of daily living:  The patient is able 2 prepare food and peds date getting dressed using the toilet morning around from place to place without assistance.     Home safety :    The patient has smoke detectors and wears seatbelts. No firearms at home. No excess sun exposure.      Diet and exercise:  The patient  does exercise.  The need to exercise regularly was discussed. Dietary issues discussed. Healthy diet discussed.      Depression screen :  Over the past 2 weeks, the patient has not felt down, depressed or hopeless. Over the past 2 weeks, the patient hasn't felt little interest or pleasure in doing things.      Past surgical history and problem list:  The following portions of the patient's history were reviewed and updated as appropriate:  allergies, current medications, past family history, past medical history,  past surgical history, past social history  and problem list.  Vision, hearing, body mass index were assessed and reviewed. Cognitive impairment assessment revealed cognition, memory and judgment appear normal.   During the course of the visit the patient was educated and counseled about appropriate screening and preventive services including : fall prevention , diabetes screening, nutrition counseling, vaccination.   F/u FMS and difficulties sleeping  C/o skin lesions - chronic    BP Readings from Last 3 Encounters:  09/25/13 137/80  09/05/12 140/80  09/04/11 148/90   Wt Readings from Last 3 Encounters:  09/25/13 129 lb (58.514 kg)  09/05/12 126 lb (57.153 kg)  09/04/11 129 lb (58.514 kg)      Review of Systems  Constitutional:  Negative.  Negative for fever, chills, diaphoresis, activity change, appetite change, fatigue and unexpected weight change.  HENT: Negative for congestion, ear pain, facial swelling, hearing loss, mouth sores, nosebleeds, postnasal drip, rhinorrhea, sinus pressure, sneezing, sore throat, tinnitus and trouble swallowing.   Eyes: Negative for pain, discharge, redness, itching and visual disturbance.  Respiratory: Negative for cough, chest tightness, shortness of breath, wheezing and stridor.   Cardiovascular: Negative for chest pain, palpitations and leg swelling.  Gastrointestinal: Negative for nausea, diarrhea, constipation, blood in stool, abdominal distention, anal bleeding and rectal pain.  Genitourinary: Negative for dysuria, urgency, frequency, hematuria, flank pain, vaginal bleeding, vaginal discharge, difficulty urinating, genital sores and pelvic pain.       Menopausal  Musculoskeletal: Positive for myalgias. Negative for arthralgias, back pain, gait problem, joint swelling, neck pain and neck stiffness.  Skin: Negative.  Negative for rash.  Neurological: Negative for dizziness, tremors, seizures, syncope, speech difficulty, weakness, numbness and headaches.  Hematological: Negative for adenopathy. Does not bruise/bleed easily.  Psychiatric/Behavioral: Negative for suicidal ideas, behavioral problems, sleep disturbance, dysphoric mood and decreased concentration. The patient is not nervous/anxious.        Objective:   Physical Exam  Constitutional: She appears well-developed and well-nourished. No distress.  HENT:  Head: Normocephalic.  Right Ear: External ear normal.  Left Ear: External ear normal.  Nose: Nose normal.  Mouth/Throat: Oropharynx is clear and moist.  Eyes: Conjunctivae are normal. Pupils are equal, round, and reactive to light. Right eye exhibits no discharge. Left  eye exhibits no discharge.  Neck: Normal range of motion. Neck supple. No JVD present. No tracheal  deviation present. No thyromegaly present.  Cardiovascular: Normal rate, regular rhythm and normal heart sounds.   Pulmonary/Chest: No stridor. No respiratory distress. She has no wheezes.  Abdominal: Soft. Bowel sounds are normal. She exhibits no distension and no mass. There is no tenderness. There is no rebound and no guarding.  Musculoskeletal: She exhibits tenderness. She exhibits no edema.  L lat epicondyl is tender  Lymphadenopathy:    She has no cervical adenopathy.  Neurological: She displays normal reflexes. No cranial nerve deficit. She exhibits normal muscle tone. Coordination normal.  Skin: No rash noted. No erythema.  Psychiatric: She has a normal mood and affect. Her behavior is normal. Judgment and thought content normal.  SKs B breasts are NT w/mild fibrocystic changes      Lab Results  Component Value Date   WBC 4.4* 09/05/2012   HGB 14.4 09/05/2012   HCT 43.1 09/05/2012   PLT 163.0 09/05/2012   GLUCOSE 88 09/05/2012   CHOL 175 09/10/2012   TRIG 96.0 09/10/2012   HDL 73.50 09/10/2012   LDLCALC 82 09/10/2012   ALT 18 09/05/2012   AST 17 09/05/2012   NA 141 09/05/2012   K 4.1 09/05/2012   CL 104 09/05/2012   CREATININE 0.7 09/05/2012   BUN 12 09/05/2012   CO2 30 09/05/2012   TSH 1.88 09/05/2012          Assessment & Plan:

## 2013-09-25 NOTE — Patient Instructions (Signed)
Preventive Care for Adults A healthy lifestyle and preventive care can promote health and wellness. Preventive health guidelines for women include the following key practices.  A routine yearly physical is a good way to check with your health care provider about your health and preventive screening. It is a chance to share any concerns and updates on your health and to receive a thorough exam.  Visit your dentist for a routine exam and preventive care every 6 months. Brush your teeth twice a day and floss once a day. Good oral hygiene prevents tooth decay and gum disease.  The frequency of eye exams is based on your age, health, family medical history, use of contact lenses, and other factors. Follow your health care provider's recommendations for frequency of eye exams.  Eat a healthy diet. Foods like vegetables, fruits, whole grains, low-fat dairy products, and lean protein foods contain the nutrients you need without too many calories. Decrease your intake of foods high in solid fats, added sugars, and salt. Eat the right amount of calories for you.Get information about a proper diet from your health care provider, if necessary.  Regular physical exercise is one of the most important things you can do for your health. Most adults should get at least 150 minutes of moderate-intensity exercise (any activity that increases your heart rate and causes you to sweat) each week. In addition, most adults need muscle-strengthening exercises on 2 or more days a week.  Maintain a healthy weight. The body mass index (BMI) is a screening tool to identify possible weight problems. It provides an estimate of body fat based on height and weight. Your health care provider can find your BMI and can help you achieve or maintain a healthy weight.For adults 20 years and older:  A BMI below 18.5 is considered underweight.  A BMI of 18.5 to 24.9 is normal.  A BMI of 25 to 29.9 is considered overweight.  A BMI of  30 and above is considered obese.  Maintain normal blood lipids and cholesterol levels by exercising and minimizing your intake of saturated fat. Eat a balanced diet with plenty of fruit and vegetables. Blood tests for lipids and cholesterol should begin at age 64 and be repeated every 5 years. If your lipid or cholesterol levels are high, you are over 50, or you are at high risk for heart disease, you may need your cholesterol levels checked more frequently.Ongoing high lipid and cholesterol levels should be treated with medicines if diet and exercise are not working.  If you smoke, find out from your health care provider how to quit. If you do not use tobacco, do not start.  Lung cancer screening is recommended for adults aged 53-80 years who are at high risk for developing lung cancer because of a history of smoking. A yearly low-dose CT scan of the lungs is recommended for people who have at least a 30-pack-year history of smoking and are a current smoker or have quit within the past 15 years. A pack year of smoking is smoking an average of 1 pack of cigarettes a day for 1 year (for example: 1 pack a day for 30 years or 2 packs a day for 15 years). Yearly screening should continue until the smoker has stopped smoking for at least 15 years. Yearly screening should be stopped for people who develop a health problem that would prevent them from having lung cancer treatment.  If you are pregnant, do not drink alcohol. If you are breastfeeding,  be very cautious about drinking alcohol. If you are not pregnant and choose to drink alcohol, do not have more than 1 drink per day. One drink is considered to be 12 ounces (355 mL) of beer, 5 ounces (148 mL) of wine, or 1.5 ounces (44 mL) of liquor.  Avoid use of street drugs. Do not share needles with anyone. Ask for help if you need support or instructions about stopping the use of drugs.  High blood pressure causes heart disease and increases the risk of  stroke. Your blood pressure should be checked at least every 1 to 2 years. Ongoing high blood pressure should be treated with medicines if weight loss and exercise do not work.  If you are 75-52 years old, ask your health care provider if you should take aspirin to prevent strokes.  Diabetes screening involves taking a blood sample to check your fasting blood sugar level. This should be done once every 3 years, after age 15, if you are within normal weight and without risk factors for diabetes. Testing should be considered at a younger age or be carried out more frequently if you are overweight and have at least 1 risk factor for diabetes.  Breast cancer screening is essential preventive care for women. You should practice "breast self-awareness." This means understanding the normal appearance and feel of your breasts and may include breast self-examination. Any changes detected, no matter how small, should be reported to a health care provider. Women in their 58s and 30s should have a clinical breast exam (CBE) by a health care provider as part of a regular health exam every 1 to 3 years. After age 16, women should have a CBE every year. Starting at age 53, women should consider having a mammogram (breast X-ray test) every year. Women who have a family history of breast cancer should talk to their health care provider about genetic screening. Women at a high risk of breast cancer should talk to their health care providers about having an MRI and a mammogram every year.  Breast cancer gene (BRCA)-related cancer risk assessment is recommended for women who have family members with BRCA-related cancers. BRCA-related cancers include breast, ovarian, tubal, and peritoneal cancers. Having family members with these cancers may be associated with an increased risk for harmful changes (mutations) in the breast cancer genes BRCA1 and BRCA2. Results of the assessment will determine the need for genetic counseling and  BRCA1 and BRCA2 testing.  Routine pelvic exams to screen for cancer are no longer recommended for nonpregnant women who are considered low risk for cancer of the pelvic organs (ovaries, uterus, and vagina) and who do not have symptoms. Ask your health care provider if a screening pelvic exam is right for you.  If you have had past treatment for cervical cancer or a condition that could lead to cancer, you need Pap tests and screening for cancer for at least 20 years after your treatment. If Pap tests have been discontinued, your risk factors (such as having a new sexual partner) need to be reassessed to determine if screening should be resumed. Some women have medical problems that increase the chance of getting cervical cancer. In these cases, your health care provider may recommend more frequent screening and Pap tests.  The HPV test is an additional test that may be used for cervical cancer screening. The HPV test looks for the virus that can cause the cell changes on the cervix. The cells collected during the Pap test can be  tested for HPV. The HPV test could be used to screen women aged 12 years and older, and should be used in women of any age who have unclear Pap test results. After the age of 53, women should have HPV testing at the same frequency as a Pap test.  Colorectal cancer can be detected and often prevented. Most routine colorectal cancer screening begins at the age of 4 years and continues through age 29 years. However, your health care provider may recommend screening at an earlier age if you have risk factors for colon cancer. On a yearly basis, your health care provider may provide home test kits to check for hidden blood in the stool. Use of a small camera at the end of a tube, to directly examine the colon (sigmoidoscopy or colonoscopy), can detect the earliest forms of colorectal cancer. Talk to your health care provider about this at age 12, when routine screening begins. Direct  exam of the colon should be repeated every 5-10 years through age 16 years, unless early forms of pre-cancerous polyps or small growths are found.  People who are at an increased risk for hepatitis B should be screened for this virus. You are considered at high risk for hepatitis B if:  You were born in a country where hepatitis B occurs often. Talk with your health care provider about which countries are considered high risk.  Your parents were born in a high-risk country and you have not received a shot to protect against hepatitis B (hepatitis B vaccine).  You have HIV or AIDS.  You use needles to inject street drugs.  You live with, or have sex with, someone who has hepatitis B.  You get hemodialysis treatment.  You take certain medicines for conditions like cancer, organ transplantation, and autoimmune conditions.  Hepatitis C blood testing is recommended for all people born from [birth year] through 1965 and any individual with known risks for hepatitis C.  Practice safe sex. Use condoms and avoid high-risk sexual practices to reduce the spread of sexually transmitted infections (STIs). STIs include gonorrhea, chlamydia, syphilis, trichomonas, herpes, HPV, and human immunodeficiency virus (HIV). Herpes, HIV, and HPV are viral illnesses that have no cure. They can result in disability, cancer, and death.  You should be screened for sexually transmitted illnesses (STIs) including gonorrhea and chlamydia if:  You are sexually active and are younger than 24 years.  You are older than 24 years and your health care provider tells you that you are at risk for this type of infection.  Your sexual activity has changed since you were last screened and you are at an increased risk for chlamydia or gonorrhea. Ask your health care provider if you are at risk.  If you are at risk of being infected with HIV, it is recommended that you take a prescription medicine daily to prevent HIV infection. This is  called preexposure prophylaxis (PrEP). You are considered at risk if:  You are a heterosexual woman, are sexually active, and are at increased risk for HIV infection.  You take drugs by injection.  You are sexually active with a partner who has HIV.  Talk with your health care provider about whether you are at high risk of being infected with HIV. If you choose to begin PrEP, you should first be tested for HIV. You should then be tested every 3 months for as long as you are taking PrEP.  Osteoporosis is a disease in which the bones lose minerals and strength  with aging. This can result in serious bone fractures or breaks. The risk of osteoporosis can be identified using a bone density scan. Women ages 68 years and over and women at risk for fractures or osteoporosis should discuss screening with their health care providers. Ask your health care provider whether you should take a calcium supplement or vitamin D to reduce the rate of osteoporosis.  Menopause can be associated with physical symptoms and risks. Hormone replacement therapy is available to decrease symptoms and risks. You should talk to your health care provider about whether hormone replacement therapy is right for you.  Use sunscreen. Apply sunscreen liberally and repeatedly throughout the day. You should seek shade when your shadow is shorter than you. Protect yourself by wearing long sleeves, pants, a wide-brimmed hat, and sunglasses year round, whenever you are outdoors.  Once a month, do a whole body skin exam, using a mirror to look at the skin on your back. Tell your health care provider of new moles, moles that have irregular borders, moles that are larger than a pencil eraser, or moles that have changed in shape or color.  Stay current with required vaccines (immunizations).  Influenza vaccine. All adults should be immunized every year.  Tetanus, diphtheria, and acellular pertussis (Td, Tdap) vaccine. Pregnant women should  receive 1 dose of Tdap vaccine during each pregnancy. The dose should be obtained regardless of the length of time since the last dose. Immunization is preferred during the 27th-36th week of gestation. An adult who has not previously received Tdap or who does not know her vaccine status should receive 1 dose of Tdap. This initial dose should be followed by tetanus and diphtheria toxoids (Td) booster doses every 10 years. Adults with an unknown or incomplete history of completing a 3-dose immunization series with Td-containing vaccines should begin or complete a primary immunization series including a Tdap dose. Adults should receive a Td booster every 10 years.  Varicella vaccine. An adult without evidence of immunity to varicella should receive 2 doses or a second dose if she has previously received 1 dose. Pregnant females who do not have evidence of immunity should receive the first dose after pregnancy. This first dose should be obtained before leaving the health care facility. The second dose should be obtained 4-8 weeks after the first dose.  Human papillomavirus (HPV) vaccine. Females aged 13-26 years who have not received the vaccine previously should obtain the 3-dose series. The vaccine is not recommended for use in pregnant females. However, pregnancy testing is not needed before receiving a dose. If a female is found to be pregnant after receiving a dose, no treatment is needed. In that case, the remaining doses should be delayed until after the pregnancy. Immunization is recommended for any person with an immunocompromised condition through the age of 43 years if she did not get any or all doses earlier. During the 3-dose series, the second dose should be obtained 4-8 weeks after the first dose. The third dose should be obtained 24 weeks after the first dose and 16 weeks after the second dose.  Zoster vaccine. One dose is recommended for adults aged 32 years or older unless certain conditions are  present.  Measles, mumps, and rubella (MMR) vaccine. Adults born before [birth year] generally are considered immune to measles and mumps. Adults born in [birth year] or later should have 1 or more doses of MMR vaccine unless there is a contraindication to the vaccine or there is laboratory evidence of immunity to  each of the three diseases. A routine second dose of MMR vaccine should be obtained at least 28 days after the first dose for students attending postsecondary schools, health care workers, or international travelers. People who received inactivated measles vaccine or an unknown type of measles vaccine during 1963-1967 should receive 2 doses of MMR vaccine. People who received inactivated mumps vaccine or an unknown type of mumps vaccine before 1979 and are at high risk for mumps infection should consider immunization with 2 doses of MMR vaccine. For females of childbearing age, rubella immunity should be determined. If there is no evidence of immunity, females who are not pregnant should be vaccinated. If there is no evidence of immunity, females who are pregnant should delay immunization until after pregnancy. Unvaccinated health care workers born before [birth year] who lack laboratory evidence of measles, mumps, or rubella immunity or laboratory confirmation of disease should consider measles and mumps immunization with 2 doses of MMR vaccine or rubella immunization with 1 dose of MMR vaccine.  Pneumococcal 13-valent conjugate (PCV13) vaccine. When indicated, a person who is uncertain of her immunization history and has no record of immunization should receive the PCV13 vaccine. An adult aged 79 years or older who has certain medical conditions and has not been previously immunized should receive 1 dose of PCV13 vaccine. This PCV13 should be followed with a dose of pneumococcal polysaccharide (PPSV23) vaccine. The PPSV23 vaccine dose should be obtained at least 8 weeks after the dose of PCV13 vaccine. An adult aged 42  years or older who has certain medical conditions and previously received 1 or more doses of PPSV23 vaccine should receive 1 dose of PCV13. The PCV13 vaccine dose should be obtained 1 or more years after the last PPSV23 vaccine dose.  Pneumococcal polysaccharide (PPSV23) vaccine. When PCV13 is also indicated, PCV13 should be obtained first. All adults aged 11 years and older should be immunized. An adult younger than age 83 years who has certain medical conditions should be immunized. Any person who resides in a nursing home or long-term care facility should be immunized. An adult smoker should be immunized. People with an immunocompromised condition and certain other conditions should receive both PCV13 and PPSV23 vaccines. People with human immunodeficiency virus (HIV) infection should be immunized as soon as possible after diagnosis. Immunization during chemotherapy or radiation therapy should be avoided. Routine use of PPSV23 vaccine is not recommended for American Indians, [place] Natives, or people younger than 65 years unless there are medical conditions that require PPSV23 vaccine. When indicated, people who have unknown immunization and have no record of immunization should receive PPSV23 vaccine. One-time revaccination 5 years after the first dose of PPSV23 is recommended for people aged 19-64 years who have chronic kidney failure, nephrotic syndrome, asplenia, or immunocompromised conditions. People who received 1-2 doses of PPSV23 before age 36 years should receive another dose of PPSV23 vaccine at age 14 years or later if at least 5 years have passed since the previous dose. Doses of PPSV23 are not needed for people immunized with PPSV23 at or after age 11 years.  Meningococcal vaccine. Adults with asplenia or persistent complement component deficiencies should receive 2 doses of quadrivalent meningococcal conjugate (MenACWY-D) vaccine. The doses should be obtained at least 2 months apart.  Microbiologists working with certain meningococcal bacteria, [employer] recruits, people at risk during an outbreak, and people who travel to or live in countries with a high rate of meningitis should be immunized. A first-year college student up through age  21 years who is living in a residence hall should receive a dose if she did not receive a dose on or after her 16th birthday. Adults who have certain high-risk conditions should receive one or more doses of vaccine.  Hepatitis A vaccine. Adults who wish to be protected from this disease, have certain high-risk conditions, work with hepatitis A-infected animals, work in hepatitis A research labs, or travel to or work in countries with a high rate of hepatitis A should be immunized. Adults who were previously unvaccinated and who anticipate close contact with an international adoptee during the first 60 days after arrival in the [place] from a country with a high rate of hepatitis A should be immunized.  Hepatitis B vaccine. Adults who wish to be protected from this disease, have certain high-risk conditions, may be exposed to blood or other infectious body fluids, are household contacts or sex partners of hepatitis B positive people, are clients or workers in certain care facilities, or travel to or work in countries with a high rate of hepatitis B should be immunized.  Haemophilus influenzae type b (Hib) vaccine. A previously unvaccinated person with asplenia or sickle cell disease or having a scheduled splenectomy should receive 1 dose of Hib vaccine. Regardless of previous immunization, a recipient of a hematopoietic stem cell transplant should receive a 3-dose series 6-12 months after her successful transplant. Hib vaccine is not recommended for adults with HIV infection. Preventive Services / Frequency Ages 68 to 60 years  Blood pressure check.** / Every 1 to 2 years.  Lipid and cholesterol check.** / Every 5 years beginning at age  38.  Clinical breast exam.** / Every 3 years for women in their 24s and 42s.  BRCA-related cancer risk assessment.** / For women who have family members with a BRCA-related cancer (breast, ovarian, tubal, or peritoneal cancers).  Pap test.** / Every 2 years from ages 24 through 74. Every 3 years starting at age 70 through age 67 or 51 with a history of 3 consecutive normal Pap tests.  HPV screening.** / Every 3 years from ages 68 through ages 45 to 53 with a history of 3 consecutive normal Pap tests.  Hepatitis C blood test.** / For any individual with known risks for hepatitis C.  Skin self-exam. / Monthly.  Influenza vaccine. / Every year.  Tetanus, diphtheria, and acellular pertussis (Tdap, Td) vaccine.** / Consult your health care provider. Pregnant women should receive 1 dose of Tdap vaccine during each pregnancy. 1 dose of Td every 10 years.  Varicella vaccine.** / Consult your health care provider. Pregnant females who do not have evidence of immunity should receive the first dose after pregnancy.  HPV vaccine. / 3 doses over 6 months, if 31 and younger. The vaccine is not recommended for use in pregnant females. However, pregnancy testing is not needed before receiving a dose.  Measles, mumps, rubella (MMR) vaccine.** / You need at least 1 dose of MMR if you were born in 1957 or later. You may also need a 2nd dose. For females of childbearing age, rubella immunity should be determined. If there is no evidence of immunity, females who are not pregnant should be vaccinated. If there is no evidence of immunity, females who are pregnant should delay immunization until after pregnancy.  Pneumococcal 13-valent conjugate (PCV13) vaccine.** / Consult your health care provider.  Pneumococcal polysaccharide (PPSV23) vaccine.** / 1 to 2 doses if you smoke cigarettes or if you have certain conditions.  Meningococcal vaccine.** /  1 dose if you are age 19 to 21 years and a first-year college  student living in a residence hall, or have one of several medical conditions, you need to get vaccinated against meningococcal disease. You may also need additional booster doses.  Hepatitis A vaccine.** / Consult your health care provider.  Hepatitis B vaccine.** / Consult your health care provider.  Haemophilus influenzae type b (Hib) vaccine.** / Consult your health care provider. Ages 40 to 64 years  Blood pressure check.** / Every 1 to 2 years.  Lipid and cholesterol check.** / Every 5 years beginning at age 20 years.  Lung cancer screening. / Every year if you are aged 55-80 years and have a 30-pack-year history of smoking and currently smoke or have quit within the past 15 years. Yearly screening is stopped once you have quit smoking for at least 15 years or develop a health problem that would prevent you from having lung cancer treatment.  Clinical breast exam.** / Every year after age 40 years.  BRCA-related cancer risk assessment.** / For women who have family members with a BRCA-related cancer (breast, ovarian, tubal, or peritoneal cancers).  Mammogram.** / Every year beginning at age 40 years and continuing for as long as you are in good health. Consult with your health care provider.  Pap test.** / Every 3 years starting at age 30 years through age 65 or 70 years with a history of 3 consecutive normal Pap tests.  HPV screening.** / Every 3 years from ages 30 years through ages 65 to 70 years with a history of 3 consecutive normal Pap tests.  Fecal occult blood test (FOBT) of stool. / Every year beginning at age 50 years and continuing until age 75 years. You may not need to do this test if you get a colonoscopy every 10 years.  Flexible sigmoidoscopy or colonoscopy.** / Every 5 years for a flexible sigmoidoscopy or every 10 years for a colonoscopy beginning at age 50 years and continuing until age 75 years.  Hepatitis C blood test.** / For all people born from 1945 through  1965 and any individual with known risks for hepatitis C.  Skin self-exam. / Monthly.  Influenza vaccine. / Every year.  Tetanus, diphtheria, and acellular pertussis (Tdap/Td) vaccine.** / Consult your health care provider. Pregnant women should receive 1 dose of Tdap vaccine during each pregnancy. 1 dose of Td every 10 years.  Varicella vaccine.** / Consult your health care provider. Pregnant females who do not have evidence of immunity should receive the first dose after pregnancy.  Zoster vaccine.** / 1 dose for adults aged 60 years or older.  Measles, mumps, rubella (MMR) vaccine.** / You need at least 1 dose of MMR if you were born in 1957 or later. You may also need a 2nd dose. For females of childbearing age, rubella immunity should be determined. If there is no evidence of immunity, females who are not pregnant should be vaccinated. If there is no evidence of immunity, females who are pregnant should delay immunization until after pregnancy.  Pneumococcal 13-valent conjugate (PCV13) vaccine.** / Consult your health care provider.  Pneumococcal polysaccharide (PPSV23) vaccine.** / 1 to 2 doses if you smoke cigarettes or if you have certain conditions.  Meningococcal vaccine.** / Consult your health care provider.  Hepatitis A vaccine.** / Consult your health care provider.  Hepatitis B vaccine.** / Consult your health care provider.  Haemophilus influenzae type b (Hib) vaccine.** / Consult your health care provider. Ages 65   years and over  Blood pressure check.** / Every 1 to 2 years.  Lipid and cholesterol check.** / Every 5 years beginning at age 62 years.  Lung cancer screening. / Every year if you are aged 21-80 years and have a 30-pack-year history of smoking and currently smoke or have quit within the past 15 years. Yearly screening is stopped once you have quit smoking for at least 15 years or develop a health problem that would prevent you from having lung cancer  treatment.  Clinical breast exam.** / Every year after age 56 years.  BRCA-related cancer risk assessment.** / For women who have family members with a BRCA-related cancer (breast, ovarian, tubal, or peritoneal cancers).  Mammogram.** / Every year beginning at age 27 years and continuing for as long as you are in good health. Consult with your health care provider.  Pap test.** / Every 3 years starting at age 51 years through age 48 or 54 years with 3 consecutive normal Pap tests. Testing can be stopped between 65 and 70 years with 3 consecutive normal Pap tests and no abnormal Pap or HPV tests in the past 10 years.  HPV screening.** / Every 3 years from ages 61 years through ages 58 or 106 years with a history of 3 consecutive normal Pap tests. Testing can be stopped between 65 and 70 years with 3 consecutive normal Pap tests and no abnormal Pap or HPV tests in the past 10 years.  Fecal occult blood test (FOBT) of stool. / Every year beginning at age 31 years and continuing until age 59 years. You may not need to do this test if you get a colonoscopy every 10 years.  Flexible sigmoidoscopy or colonoscopy.** / Every 5 years for a flexible sigmoidoscopy or every 10 years for a colonoscopy beginning at age 65 years and continuing until age 65 years.  Hepatitis C blood test.** / For all people born from [birth year] through 1963 and any individual with known risks for hepatitis C.  Osteoporosis screening.** / A one-time screening for women ages 55 years and over and women at risk for fractures or osteoporosis.  Skin self-exam. / Monthly.  Influenza vaccine. / Every year.  Tetanus, diphtheria, and acellular pertussis (Tdap/Td) vaccine.** / 1 dose of Td every 10 years.  Varicella vaccine.** / Consult your health care provider.  Zoster vaccine.** / 1 dose for adults aged 59 years or older.  Pneumococcal 13-valent conjugate (PCV13) vaccine.** / Consult your health care provider.  Pneumococcal  polysaccharide (PPSV23) vaccine.** / 1 dose for all adults aged 73 years and older.  Meningococcal vaccine.** / Consult your health care provider.  Hepatitis A vaccine.** / Consult your health care provider.  Hepatitis B vaccine.** / Consult your health care provider.  Haemophilus influenzae type b (Hib) vaccine.** / Consult your health care provider. ** Family history and personal history of risk and conditions may change your health care provider's recommendations. Document Released: 04/04/2001 Document Revised: 06/23/2013 Document Reviewed: 07/04/2010 [facility] Patient Information 2015 [place], [place]. This information is not intended to replace advice given to you by your health care provider. Make sure you discuss any questions you have with your health care provider.

## 2013-09-25 NOTE — Assessment & Plan Note (Signed)
Labs

## 2013-09-25 NOTE — Assessment & Plan Note (Signed)
Wt Readings from Last 3 Encounters:  09/25/13 129 lb (58.514 kg)  09/05/12 126 lb (57.153 kg)  09/04/11 129 lb (58.514 kg)

## 2013-12-05 ENCOUNTER — Other Ambulatory Visit: Payer: Self-pay

## 2014-05-07 ENCOUNTER — Telehealth: Payer: Self-pay

## 2014-05-07 NOTE — Telephone Encounter (Signed)
Pt declines flu vaccine

## 2014-06-30 IMAGING — CR DG CHEST 2V
2 series · 2 of 2 positions shown · non-contrast
Comparison: [DATE]

CLINICAL DATA: Chest wall pain

CHEST - 2 VIEW

[view not recorded (1 of 2)]
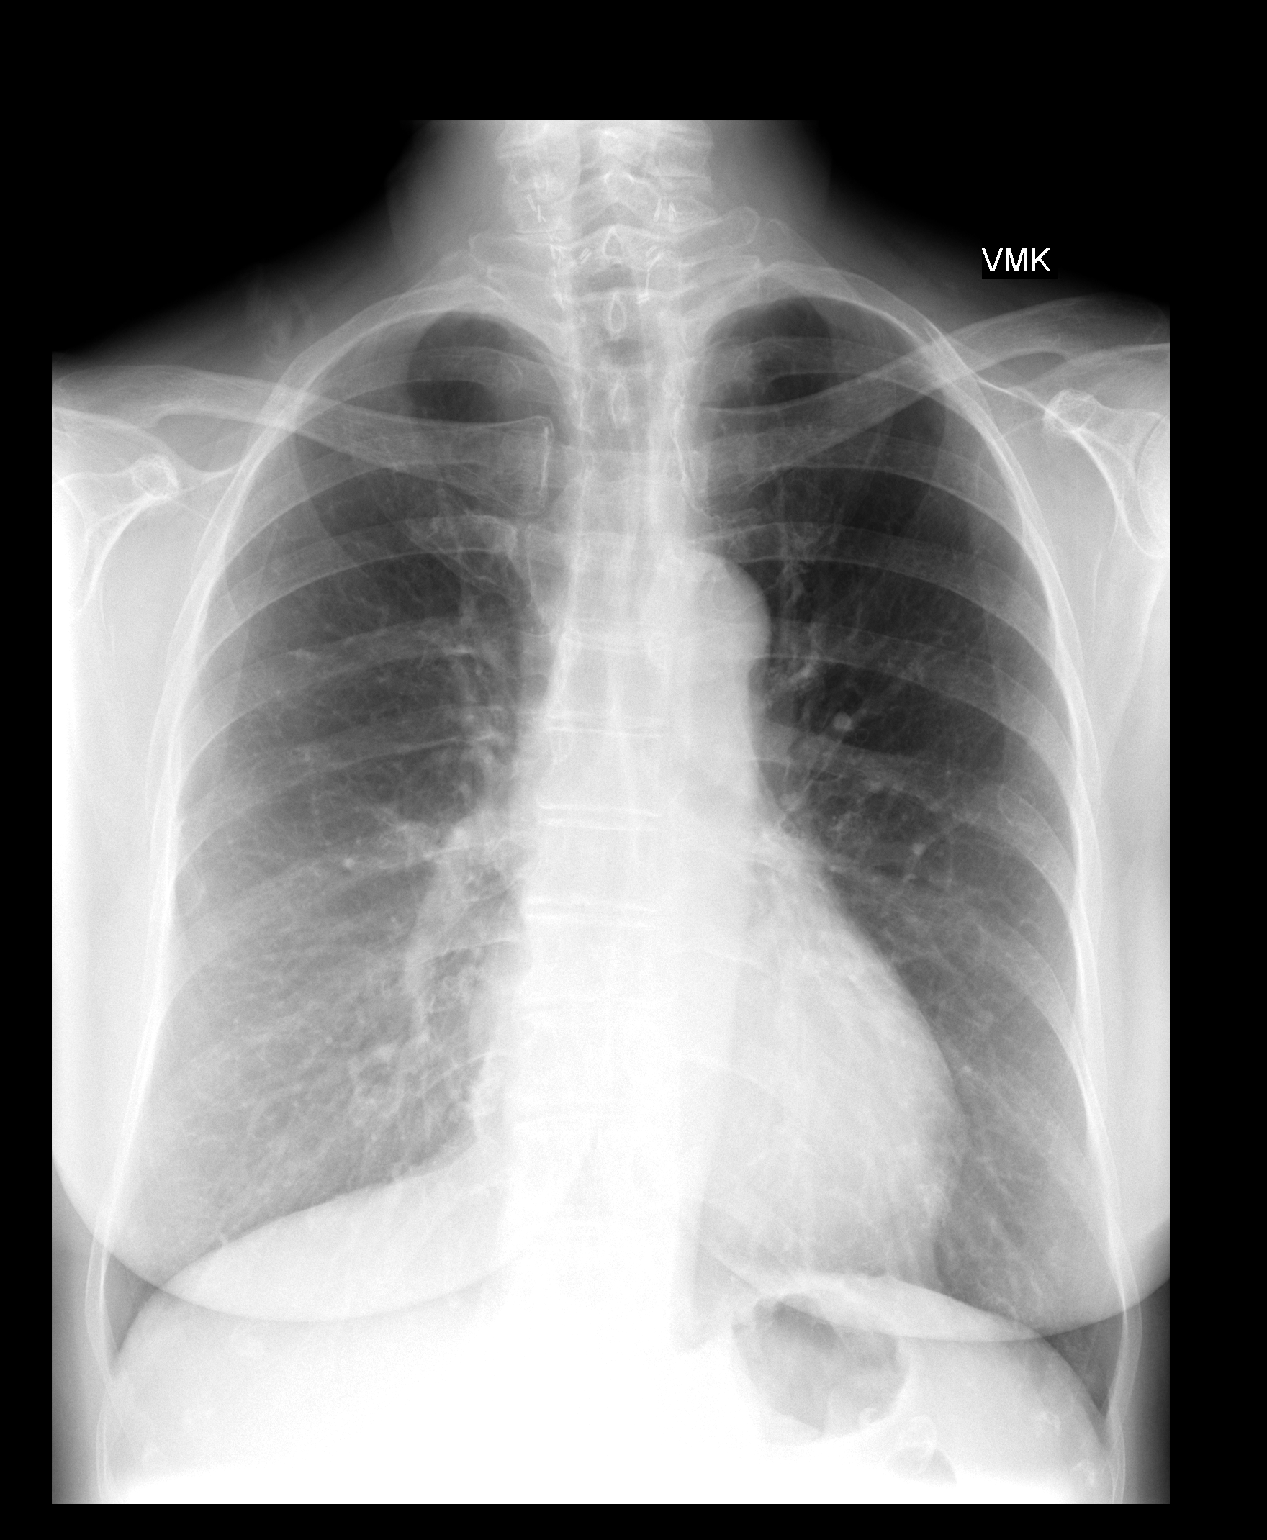

[view not recorded (2 of 2)]
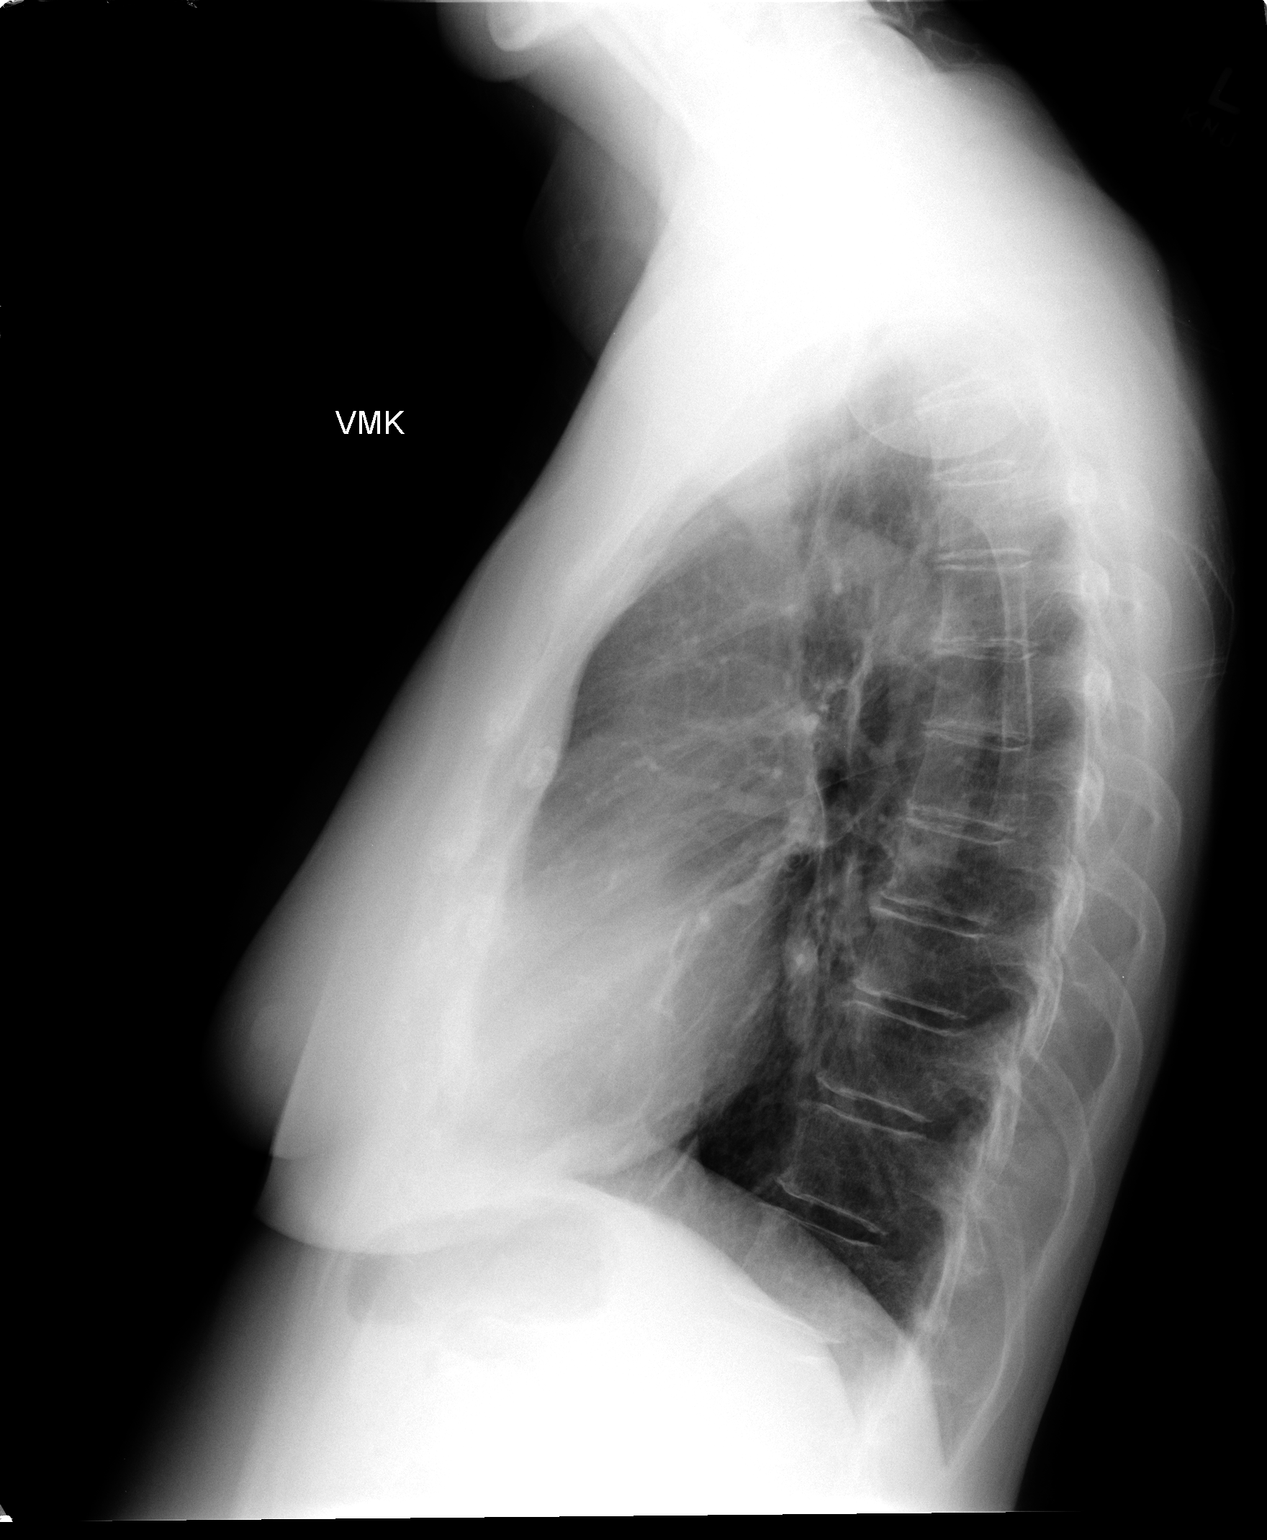

[2 of 2 positions shown; findings below may reference images not displayed]

FINDINGS: Cardiomediastinal silhouette is stable.  No acute
infiltrate or pleural effusion.  No pulmonary edema.  Bony thorax
is unremarkable.
IMPRESSION: No active disease.  No significant change.

## 2014-09-14 ENCOUNTER — Other Ambulatory Visit: Payer: Self-pay | Admitting: Internal Medicine

## 2014-09-29 ENCOUNTER — Telehealth: Payer: Self-pay | Admitting: *Deleted

## 2014-09-29 ENCOUNTER — Encounter: Payer: Self-pay | Admitting: Internal Medicine

## 2014-09-29 ENCOUNTER — Other Ambulatory Visit (INDEPENDENT_AMBULATORY_CARE_PROVIDER_SITE_OTHER): Payer: Medicare Other

## 2014-09-29 ENCOUNTER — Ambulatory Visit (INDEPENDENT_AMBULATORY_CARE_PROVIDER_SITE_OTHER): Payer: Medicare Other | Admitting: Internal Medicine

## 2014-09-29 VITALS — BP 138/90 | HR 73 | Ht 62.0 in | Wt 116.0 lb

## 2014-09-29 DIAGNOSIS — E038 Other specified hypothyroidism: Secondary | ICD-10-CM

## 2014-09-29 DIAGNOSIS — Z0000 Encounter for general adult medical examination without abnormal findings: Secondary | ICD-10-CM

## 2014-09-29 DIAGNOSIS — E785 Hyperlipidemia, unspecified: Secondary | ICD-10-CM | POA: Diagnosis not present

## 2014-09-29 DIAGNOSIS — E034 Atrophy of thyroid (acquired): Secondary | ICD-10-CM

## 2014-09-29 DIAGNOSIS — M545 Low back pain, unspecified: Secondary | ICD-10-CM

## 2014-09-29 LAB — BASIC METABOLIC PANEL
BUN: 13 mg/dL (ref 6–23)
CALCIUM: 9.7 mg/dL (ref 8.4–10.5)
CO2: 32 mEq/L (ref 19–32)
Chloride: 103 mEq/L (ref 96–112)
Creatinine, Ser: 0.65 mg/dL (ref 0.40–1.20)
GFR: 93.59 mL/min (ref >60.00)
Glucose, Bld: 101 mg/dL — ABNORMAL HIGH (ref 70–99)
POTASSIUM: 4.5 meq/L (ref 3.5–5.1)
Sodium: 139 mEq/L (ref 135–145)

## 2014-09-29 LAB — CBC WITH DIFFERENTIAL/PLATELET
BASOS ABS: 0 10*3/uL (ref 0.0–0.1)
BASOS PCT: 0.3 % (ref 0.0–3.0)
Eosinophils Absolute: 0.1 10*3/uL (ref 0.0–0.7)
Eosinophils Relative: 2.3 % (ref 0.0–5.0)
HCT: 42.1 % (ref 36.0–46.0)
Hemoglobin: 14 g/dL (ref 12.0–15.0)
Lymphocytes Relative: 16.8 % (ref 12.0–46.0)
Lymphs Abs: 0.9 10*3/uL (ref 0.7–4.0)
MCHC: 33.3 g/dL (ref 30.0–36.0)
MCV: 89.3 fl (ref 78.0–100.0)
MONOS PCT: 5.9 % (ref 3.0–12.0)
Monocytes Absolute: 0.3 10*3/uL (ref 0.1–1.0)
NEUTROS PCT: 74.7 % (ref 43.0–77.0)
Neutro Abs: 4 10*3/uL (ref 1.4–7.7)
Platelets: 191 10*3/uL (ref 150.0–400.0)
RBC: 4.71 Mil/uL (ref 3.87–5.11)
RDW: 13.9 % (ref 11.5–15.5)
WBC: 5.4 10*3/uL (ref 4.0–10.5)

## 2014-09-29 LAB — URINALYSIS
BILIRUBIN URINE: NEGATIVE
HGB URINE DIPSTICK: NEGATIVE
KETONES UR: NEGATIVE
Leukocytes, UA: NEGATIVE
Nitrite: NEGATIVE
Specific Gravity, Urine: 1.01 (ref 1.000–1.030)
Total Protein, Urine: NEGATIVE
URINE GLUCOSE: NEGATIVE
UROBILINOGEN UA: 0.2 (ref 0.0–1.0)
pH: 8.5 — AB (ref 5.0–8.0)

## 2014-09-29 LAB — LIPID PANEL
CHOL/HDL RATIO: 2
CHOLESTEROL: 183 mg/dL (ref 0–200)
HDL: 95.5 mg/dL (ref >39.00)
LDL Cholesterol: 78 mg/dL (ref 0–99)
NONHDL: 87.95
Triglycerides: 51 mg/dL (ref 0.0–149.0)
VLDL: 10.2 mg/dL (ref 0.0–40.0)

## 2014-09-29 LAB — HEPATIC FUNCTION PANEL
ALT: 17 U/L (ref 0–35)
AST: 16 U/L (ref 0–37)
Albumin: 4.2 g/dL (ref 3.5–5.2)
Alkaline Phosphatase: 57 U/L (ref 39–117)
Bilirubin, Direct: 0.1 mg/dL (ref 0.0–0.3)
TOTAL PROTEIN: 6.9 g/dL (ref 6.0–8.3)
Total Bilirubin: 0.5 mg/dL (ref 0.2–1.2)

## 2014-09-29 LAB — TSH: TSH: 1.75 u[IU]/mL (ref 0.35–4.50)

## 2014-09-29 MED ORDER — SYNTHROID 75 MCG PO TABS: 75.0000 ug | ORAL_TABLET | Freq: Every day | ORAL | Status: DC

## 2014-09-29 MED ORDER — HYDROCODONE-IBUPROFEN 5-200 MG PO TABS: 1.0000 | ORAL_TABLET | Freq: Three times a day (TID) | ORAL | Status: DC | PRN

## 2014-09-29 MED ORDER — ACYCLOVIR 800 MG PO TABS: 800.0000 mg | ORAL_TABLET | Freq: Every day | ORAL | Status: DC

## 2014-09-29 NOTE — Progress Notes (Signed)
Subjective:  Patient ID: Lindsay Maldonado, female    DOB: 1935/02/14  Age: 80 y.o. MRN: [id number]  CC: No chief complaint on file.   HPI Lindsay Maldonado presents for a MWV. Mr Folds died in 2014/04/10 C/o R LBP x 1 d  Outpatient Prescriptions Prior to Visit  Medication Sig Dispense Refill  . Cholecalciferol (VITAMIN D3) 1000 UNITS tablet Take 1,000 Units by mouth daily.      [name] LORazepam (ATIVAN) 1 MG tablet TAKE 1 TABLET BY MOUTH 2 TIMES DAILY AS NEEDED 60 tablet 2  . SYNTHROID 75 MCG tablet TAKE 1 TABLET BY MOUTH DAILY. 90 tablet 0   No facility-administered medications prior to visit.    ROS Review of Systems  Constitutional: Negative for chills, activity change, appetite change, fatigue and unexpected weight change.  HENT: Negative for congestion, mouth sores and sinus pressure.   Eyes: Negative for visual disturbance.  Respiratory: Negative for cough and chest tightness.   Gastrointestinal: Negative for nausea and abdominal pain.  Genitourinary: Negative for frequency, difficulty urinating and vaginal pain.  Musculoskeletal: Negative for back pain and gait problem.  Skin: Negative for pallor and rash.  Neurological: Negative for dizziness, tremors, weakness, numbness and headaches.  Psychiatric/Behavioral: Negative for suicidal ideas, confusion and sleep disturbance.    R post chest rash  Objective:  BP 138/90 mmHg  Pulse 73  Ht 5\' 2"  (1.575 m)  Wt 116 lb (52.617 kg)  BMI 21.21 kg/m2  SpO2 97%  BP Readings from Last 3 Encounters:  09/29/14 138/90  09/25/13 137/80  09/05/12 140/80    Wt Readings from Last 3 Encounters:  09/29/14 116 lb (52.617 kg)  09/25/13 129 lb (58.514 kg)  09/05/12 126 lb (57.153 kg)    Physical Exam  Skin: Rash noted.    Lab Results  Component Value Date   WBC 5.4 09/29/2014   HGB 14.0 09/29/2014   HCT 42.1 09/29/2014   PLT 191.0 09/29/2014   GLUCOSE 101* 09/29/2014   CHOL 183 09/29/2014   TRIG 51.0 09/29/2014   HDL  95.50 09/29/2014   LDLCALC 78 09/29/2014   ALT 17 09/29/2014   AST 16 09/29/2014   NA 139 09/29/2014   K 4.5 09/29/2014   CL 103 09/29/2014   CREATININE 0.65 09/29/2014   BUN 13 09/29/2014   CO2 32 09/29/2014   TSH 1.75 09/29/2014    Mm Screening Breast Tomo Bilateral  09/29/2013   CLINICAL DATA:  Screening.  EXAM: DIGITAL SCREENING BILATERAL MAMMOGRAM WITH 3D TOMO WITH CAD  DIGITAL BREAST TOMOSYNTHESIS  Digital breast tomosynthesis images are acquired in two projections. These images are reviewed in combination with the digital mammogram, confirming the findings below.  COMPARISON:  Previous exam(s).  ACR Breast Density Category b: There are scattered areas of fibroglandular density.  FINDINGS: There are no findings suspicious for malignancy. Images were processed with CAD.  IMPRESSION: No mammographic evidence of malignancy. A result letter of this screening mammogram will be mailed directly to the patient.  RECOMMENDATION: Screening mammogram in one year. (Code:[id number])  BI-RADS CATEGORY  1: Negative.   Electronically Signed   By: Luberta Robertson M.D.   On: 09/29/2013 12:52    Assessment & Plan:   Diagnoses and all orders for this visit:  Medicare annual wellness visit, subsequent -     SYNTHROID 75 MCG tablet; Take 1 tablet (75 mcg total) by mouth daily. -     Hepatic function panel; Future -  Basic metabolic panel; Future -     CBC with Differential/Platelet; Future -     Lipid panel; Future -     Urinalysis; Future -     TSH; Future  Right-sided low back pain without sciatica -     SYNTHROID 75 MCG tablet; Take 1 tablet (75 mcg total) by mouth daily. -     Hepatic function panel; Future -     Basic metabolic panel; Future -     CBC with Differential/Platelet; Future -     Lipid panel; Future -     Urinalysis; Future -     TSH; Future  Dyslipidemia -     SYNTHROID 75 MCG tablet; Take 1 tablet (75 mcg total) by mouth daily. -     Hepatic function panel; Future -      Basic metabolic panel; Future -     CBC with Differential/Platelet; Future -     Lipid panel; Future -     Urinalysis; Future -     TSH; Future  Hypothyroidism due to acquired atrophy of thyroid -     SYNTHROID 75 MCG tablet; Take 1 tablet (75 mcg total) by mouth daily. -     Hepatic function panel; Future -     Basic metabolic panel; Future -     CBC with Differential/Platelet; Future -     Lipid panel; Future -     Urinalysis; Future -     TSH; Future  Well adult exam  Other orders -     acyclovir (ZOVIRAX) 800 MG tablet; Take 1 tablet (800 mg total) by mouth 5 (five) times daily. -     Discontinue: hydrocodone-ibuprofen (VICOPROFEN) 5-200 MG per tablet; Take 1 tablet by mouth every 8 (eight) hours as needed for pain.  I have changed Ms. Slocumb's SYNTHROID. I am also having her start on acyclovir. Additionally, I am having her maintain her cholecalciferol and LORazepam.  Meds ordered this encounter  Medications  . SYNTHROID 75 MCG tablet    Sig: Take 1 tablet (75 mcg total) by mouth daily.    Dispense:  90 tablet    Refill:  3  . acyclovir (ZOVIRAX) 800 MG tablet    Sig: Take 1 tablet (800 mg total) by mouth 5 (five) times daily.    Dispense:  50 tablet    Refill:  0  . DISCONTD: hydrocodone-ibuprofen (VICOPROFEN) 5-200 MG per tablet    Sig: Take 1 tablet by mouth every 8 (eight) hours as needed for pain.    Dispense:  60 tablet    Refill:  0     Follow-up: Return in about 6 months (around 04/01/2015) for a follow-up visit.  Walker Kehr, MD

## 2014-09-29 NOTE — Patient Instructions (Addendum)
Try Valerian root for insomnia  Preventive Care for Adults A healthy lifestyle and preventive care can promote health and wellness. Preventive health guidelines for women include the following key practices.  A routine yearly physical is a good way to check with your health care provider about your health and preventive screening. It is a chance to share any concerns and updates on your health and to receive a thorough exam.  Visit your dentist for a routine exam and preventive care every 6 months. Brush your teeth twice a day and floss once a day. Good oral hygiene prevents tooth decay and gum disease.  The frequency of eye exams is based on your age, health, family medical history, use of contact lenses, and other factors. Follow your health care provider's recommendations for frequency of eye exams.  Eat a healthy diet. Foods like vegetables, fruits, whole grains, low-fat dairy products, and lean protein foods contain the nutrients you need without too many calories. Decrease your intake of foods high in solid fats, added sugars, and salt. Eat the right amount of calories for you.Get information about a proper diet from your health care provider, if necessary.  Regular physical exercise is one of the most important things you can do for your health. Most adults should get at least 150 minutes of moderate-intensity exercise (any activity that increases your heart rate and causes you to sweat) each week. In addition, most adults need muscle-strengthening exercises on 2 or more days a week.  Maintain a healthy weight. The body mass index (BMI) is a screening tool to identify possible weight problems. It provides an estimate of body fat based on height and weight. Your health care provider can find your BMI and can help you achieve or maintain a healthy weight.For adults 20 years and older:  A BMI below 18.5 is considered underweight.  A BMI of 18.5 to 24.9 is normal.  A BMI of 25 to 29.9 is  considered overweight.  A BMI of 30 and above is considered obese.  Maintain normal blood lipids and cholesterol levels by exercising and minimizing your intake of saturated fat. Eat a balanced diet with plenty of fruit and vegetables. Blood tests for lipids and cholesterol should begin at age 59 and be repeated every 5 years. If your lipid or cholesterol levels are high, you are over 50, or you are at high risk for heart disease, you may need your cholesterol levels checked more frequently.Ongoing high lipid and cholesterol levels should be treated with medicines if diet and exercise are not working.  If you smoke, find out from your health care provider how to quit. If you do not use tobacco, do not start.  Lung cancer screening is recommended for adults aged 35-80 years who are at high risk for developing lung cancer because of a history of smoking. A yearly low-dose CT scan of the lungs is recommended for people who have at least a 30-pack-year history of smoking and are a current smoker or have quit within the past 15 years. A pack year of smoking is smoking an average of 1 pack of cigarettes a day for 1 year (for example: 1 pack a day for 30 years or 2 packs a day for 15 years). Yearly screening should continue until the smoker has stopped smoking for at least 15 years. Yearly screening should be stopped for people who develop a health problem that would prevent them from having lung cancer treatment.  If you are pregnant, do not  drink alcohol. If you are breastfeeding, be very cautious about drinking alcohol. If you are not pregnant and choose to drink alcohol, do not have more than 1 drink per day. One drink is considered to be 12 ounces (355 mL) of beer, 5 ounces (148 mL) of wine, or 1.5 ounces (44 mL) of liquor.  Avoid use of street drugs. Do not share needles with anyone. Ask for help if you need support or instructions about stopping the use of drugs.  High blood pressure causes heart  disease and increases the risk of stroke. Your blood pressure should be checked at least every 1 to 2 years. Ongoing high blood pressure should be treated with medicines if weight loss and exercise do not work.  If you are 85-68 years old, ask your health care provider if you should take aspirin to prevent strokes.  Diabetes screening involves taking a blood sample to check your fasting blood sugar level. This should be done once every 3 years, after age 64, if you are within normal weight and without risk factors for diabetes. Testing should be considered at a younger age or be carried out more frequently if you are overweight and have at least 1 risk factor for diabetes.  Breast cancer screening is essential preventive care for women. You should practice "breast self-awareness." This means understanding the normal appearance and feel of your breasts and may include breast self-examination. Any changes detected, no matter how small, should be reported to a health care provider. Women in their 17s and 30s should have a clinical breast exam (CBE) by a health care provider as part of a regular health exam every 1 to 3 years. After age 79, women should have a CBE every year. Starting at age 66, women should consider having a mammogram (breast X-ray test) every year. Women who have a family history of breast cancer should talk to their health care provider about genetic screening. Women at a high risk of breast cancer should talk to their health care providers about having an MRI and a mammogram every year.  Breast cancer gene (BRCA)-related cancer risk assessment is recommended for women who have family members with BRCA-related cancers. BRCA-related cancers include breast, ovarian, tubal, and peritoneal cancers. Having family members with these cancers may be associated with an increased risk for harmful changes (mutations) in the breast cancer genes BRCA1 and BRCA2. Results of the assessment will determine  the need for genetic counseling and BRCA1 and BRCA2 testing.  Routine pelvic exams to screen for cancer are no longer recommended for nonpregnant women who are considered low risk for cancer of the pelvic organs (ovaries, uterus, and vagina) and who do not have symptoms. Ask your health care provider if a screening pelvic exam is right for you.  If you have had past treatment for cervical cancer or a condition that could lead to cancer, you need Pap tests and screening for cancer for at least 20 years after your treatment. If Pap tests have been discontinued, your risk factors (such as having a new sexual partner) need to be reassessed to determine if screening should be resumed. Some women have medical problems that increase the chance of getting cervical cancer. In these cases, your health care provider may recommend more frequent screening and Pap tests.  The HPV test is an additional test that may be used for cervical cancer screening. The HPV test looks for the virus that can cause the cell changes on the cervix. The cells collected  during the Pap test can be tested for HPV. The HPV test could be used to screen women aged 69 years and older, and should be used in women of any age who have unclear Pap test results. After the age of 66, women should have HPV testing at the same frequency as a Pap test.  Colorectal cancer can be detected and often prevented. Most routine colorectal cancer screening begins at the age of 52 years and continues through age 65 years. However, your health care provider may recommend screening at an earlier age if you have risk factors for colon cancer. On a yearly basis, your health care provider may provide home test kits to check for hidden blood in the stool. Use of a small camera at the end of a tube, to directly examine the colon (sigmoidoscopy or colonoscopy), can detect the earliest forms of colorectal cancer. Talk to your health care provider about this at age 54, when  routine screening begins. Direct exam of the colon should be repeated every 5-10 years through age 59 years, unless early forms of pre-cancerous polyps or small growths are found.  People who are at an increased risk for hepatitis B should be screened for this virus. You are considered at high risk for hepatitis B if:  You were born in a country where hepatitis B occurs often. Talk with your health care provider about which countries are considered high risk.  Your parents were born in a high-risk country and you have not received a shot to protect against hepatitis B (hepatitis B vaccine).  You have HIV or AIDS.  You use needles to inject street drugs.  You live with, or have sex with, someone who has hepatitis B.  You get hemodialysis treatment.  You take certain medicines for conditions like cancer, organ transplantation, and autoimmune conditions.  Hepatitis C blood testing is recommended for all people born from [birth year] through 1965 and any individual with known risks for hepatitis C.  Practice safe sex. Use condoms and avoid high-risk sexual practices to reduce the spread of sexually transmitted infections (STIs). STIs include gonorrhea, chlamydia, syphilis, trichomonas, herpes, HPV, and human immunodeficiency virus (HIV). Herpes, HIV, and HPV are viral illnesses that have no cure. They can result in disability, cancer, and death.  You should be screened for sexually transmitted illnesses (STIs) including gonorrhea and chlamydia if:  You are sexually active and are younger than 24 years.  You are older than 24 years and your health care provider tells you that you are at risk for this type of infection.  Your sexual activity has changed since you were last screened and you are at an increased risk for chlamydia or gonorrhea. Ask your health care provider if you are at risk.  If you are at risk of being infected with HIV, it is recommended that you take a prescription medicine daily  to prevent HIV infection. This is called preexposure prophylaxis (PrEP). You are considered at risk if:  You are a heterosexual woman, are sexually active, and are at increased risk for HIV infection.  You take drugs by injection.  You are sexually active with a partner who has HIV.  Talk with your health care provider about whether you are at high risk of being infected with HIV. If you choose to begin PrEP, you should first be tested for HIV. You should then be tested every 3 months for as long as you are taking PrEP.  Osteoporosis is a disease in which  the bones lose minerals and strength with aging. This can result in serious bone fractures or breaks. The risk of osteoporosis can be identified using a bone density scan. Women ages 60 years and over and women at risk for fractures or osteoporosis should discuss screening with their health care providers. Ask your health care provider whether you should take a calcium supplement or vitamin D to reduce the rate of osteoporosis.  Menopause can be associated with physical symptoms and risks. Hormone replacement therapy is available to decrease symptoms and risks. You should talk to your health care provider about whether hormone replacement therapy is right for you.  Use sunscreen. Apply sunscreen liberally and repeatedly throughout the day. You should seek shade when your shadow is shorter than you. Protect yourself by wearing long sleeves, pants, a wide-brimmed hat, and sunglasses year round, whenever you are outdoors.  Once a month, do a whole body skin exam, using a mirror to look at the skin on your back. Tell your health care provider of new moles, moles that have irregular borders, moles that are larger than a pencil eraser, or moles that have changed in shape or color.  Stay current with required vaccines (immunizations).  Influenza vaccine. All adults should be immunized every year.  Tetanus, diphtheria, and acellular pertussis (Td,  Tdap) vaccine. Pregnant women should receive 1 dose of Tdap vaccine during each pregnancy. The dose should be obtained regardless of the length of time since the last dose. Immunization is preferred during the 27th-36th week of gestation. An adult who has not previously received Tdap or who does not know her vaccine status should receive 1 dose of Tdap. This initial dose should be followed by tetanus and diphtheria toxoids (Td) booster doses every 10 years. Adults with an unknown or incomplete history of completing a 3-dose immunization series with Td-containing vaccines should begin or complete a primary immunization series including a Tdap dose. Adults should receive a Td booster every 10 years.  Varicella vaccine. An adult without evidence of immunity to varicella should receive 2 doses or a second dose if she has previously received 1 dose. Pregnant females who do not have evidence of immunity should receive the first dose after pregnancy. This first dose should be obtained before leaving the health care facility. The second dose should be obtained 4-8 weeks after the first dose.  Human papillomavirus (HPV) vaccine. Females aged 13-26 years who have not received the vaccine previously should obtain the 3-dose series. The vaccine is not recommended for use in pregnant females. However, pregnancy testing is not needed before receiving a dose. If a female is found to be pregnant after receiving a dose, no treatment is needed. In that case, the remaining doses should be delayed until after the pregnancy. Immunization is recommended for any person with an immunocompromised condition through the age of 51 years if she did not get any or all doses earlier. During the 3-dose series, the second dose should be obtained 4-8 weeks after the first dose. The third dose should be obtained 24 weeks after the first dose and 16 weeks after the second dose.  Zoster vaccine. One dose is recommended for adults aged 78 years or  older unless certain conditions are present.  Measles, mumps, and rubella (MMR) vaccine. Adults born before [birth year] generally are considered immune to measles and mumps. Adults born in [birth year] or later should have 1 or more doses of MMR vaccine unless there is a contraindication to the vaccine or there  is laboratory evidence of immunity to each of the three diseases. A routine second dose of MMR vaccine should be obtained at least 28 days after the first dose for students attending postsecondary schools, health care workers, or international travelers. People who received inactivated measles vaccine or an unknown type of measles vaccine during 1963-1967 should receive 2 doses of MMR vaccine. People who received inactivated mumps vaccine or an unknown type of mumps vaccine before 1979 and are at high risk for mumps infection should consider immunization with 2 doses of MMR vaccine. For females of childbearing age, rubella immunity should be determined. If there is no evidence of immunity, females who are not pregnant should be vaccinated. If there is no evidence of immunity, females who are pregnant should delay immunization until after pregnancy. Unvaccinated health care workers born before [birth year] who lack laboratory evidence of measles, mumps, or rubella immunity or laboratory confirmation of disease should consider measles and mumps immunization with 2 doses of MMR vaccine or rubella immunization with 1 dose of MMR vaccine.  Pneumococcal 13-valent conjugate (PCV13) vaccine. When indicated, a person who is uncertain of her immunization history and has no record of immunization should receive the PCV13 vaccine. An adult aged 93 years or older who has certain medical conditions and has not been previously immunized should receive 1 dose of PCV13 vaccine. This PCV13 should be followed with a dose of pneumococcal polysaccharide (PPSV23) vaccine. The PPSV23 vaccine dose should be obtained at least 8 weeks after the dose of  PCV13 vaccine. An adult aged 62 years or older who has certain medical conditions and previously received 1 or more doses of PPSV23 vaccine should receive 1 dose of PCV13. The PCV13 vaccine dose should be obtained 1 or more years after the last PPSV23 vaccine dose.  Pneumococcal polysaccharide (PPSV23) vaccine. When PCV13 is also indicated, PCV13 should be obtained first. All adults aged 58 years and older should be immunized. An adult younger than age 62 years who has certain medical conditions should be immunized. Any person who resides in a nursing home or long-term care facility should be immunized. An adult smoker should be immunized. People with an immunocompromised condition and certain other conditions should receive both PCV13 and PPSV23 vaccines. People with human immunodeficiency virus (HIV) infection should be immunized as soon as possible after diagnosis. Immunization during chemotherapy or radiation therapy should be avoided. Routine use of PPSV23 vaccine is not recommended for American Indians, [place] Natives, or people younger than 65 years unless there are medical conditions that require PPSV23 vaccine. When indicated, people who have unknown immunization and have no record of immunization should receive PPSV23 vaccine. One-time revaccination 5 years after the first dose of PPSV23 is recommended for people aged 19-64 years who have chronic kidney failure, nephrotic syndrome, asplenia, or immunocompromised conditions. People who received 1-2 doses of PPSV23 before age 29 years should receive another dose of PPSV23 vaccine at age 13 years or later if at least 5 years have passed since the previous dose. Doses of PPSV23 are not needed for people immunized with PPSV23 at or after age 38 years.  Meningococcal vaccine. Adults with asplenia or persistent complement component deficiencies should receive 2 doses of quadrivalent meningococcal conjugate (MenACWY-D) vaccine. The doses should be obtained at  least 2 months apart. Microbiologists working with certain meningococcal bacteria, [employer] recruits, people at risk during an outbreak, and people who travel to or live in countries with a high rate of meningitis should be immunized. A  first-year college student up through age 54 years who is living in a residence hall should receive a dose if she did not receive a dose on or after her 16th birthday. Adults who have certain high-risk conditions should receive one or more doses of vaccine.  Hepatitis A vaccine. Adults who wish to be protected from this disease, have certain high-risk conditions, work with hepatitis A-infected animals, work in hepatitis A research labs, or travel to or work in countries with a high rate of hepatitis A should be immunized. Adults who were previously unvaccinated and who anticipate close contact with an international adoptee during the first 60 days after arrival in the [place] from a country with a high rate of hepatitis A should be immunized.  Hepatitis B vaccine. Adults who wish to be protected from this disease, have certain high-risk conditions, may be exposed to blood or other infectious body fluids, are household contacts or sex partners of hepatitis B positive people, are clients or workers in certain care facilities, or travel to or work in countries with a high rate of hepatitis B should be immunized.  Haemophilus influenzae type b (Hib) vaccine. A previously unvaccinated person with asplenia or sickle cell disease or having a scheduled splenectomy should receive 1 dose of Hib vaccine. Regardless of previous immunization, a recipient of a hematopoietic stem cell transplant should receive a 3-dose series 6-12 months after her successful transplant. Hib vaccine is not recommended for adults with HIV infection. Preventive Services / Frequency Ages 66 to 25 years  Blood pressure check.** / Every 1 to 2 years.  Lipid and cholesterol check.** / Every 5 years  beginning at age 20.  Clinical breast exam.** / Every 3 years for women in their 75s and 47s.  BRCA-related cancer risk assessment.** / For women who have family members with a BRCA-related cancer (breast, ovarian, tubal, or peritoneal cancers).  Pap test.** / Every 2 years from ages 33 through 28. Every 3 years starting at age 50 through age 6 or 64 with a history of 3 consecutive normal Pap tests.  HPV screening.** / Every 3 years from ages 63 through ages 42 to 28 with a history of 3 consecutive normal Pap tests.  Hepatitis C blood test.** / For any individual with known risks for hepatitis C.  Skin self-exam. / Monthly.  Influenza vaccine. / Every year.  Tetanus, diphtheria, and acellular pertussis (Tdap, Td) vaccine.** / Consult your health care provider. Pregnant women should receive 1 dose of Tdap vaccine during each pregnancy. 1 dose of Td every 10 years.  Varicella vaccine.** / Consult your health care provider. Pregnant females who do not have evidence of immunity should receive the first dose after pregnancy.  HPV vaccine. / 3 doses over 6 months, if 42 and younger. The vaccine is not recommended for use in pregnant females. However, pregnancy testing is not needed before receiving a dose.  Measles, mumps, rubella (MMR) vaccine.** / You need at least 1 dose of MMR if you were born in 1957 or later. You may also need a 2nd dose. For females of childbearing age, rubella immunity should be determined. If there is no evidence of immunity, females who are not pregnant should be vaccinated. If there is no evidence of immunity, females who are pregnant should delay immunization until after pregnancy.  Pneumococcal 13-valent conjugate (PCV13) vaccine.** / Consult your health care provider.  Pneumococcal polysaccharide (PPSV23) vaccine.** / 1 to 2 doses if you smoke cigarettes or if you  have certain conditions.  Meningococcal vaccine.** / 1 dose if you are age 40 to 1 years and a  Market researcher living in a residence hall, or have one of several medical conditions, you need to get vaccinated against meningococcal disease. You may also need additional booster doses.  Hepatitis A vaccine.** / Consult your health care provider.  Hepatitis B vaccine.** / Consult your health care provider.  Haemophilus influenzae type b (Hib) vaccine.** / Consult your health care provider. Ages 67 to 54 years  Blood pressure check.** / Every 1 to 2 years.  Lipid and cholesterol check.** / Every 5 years beginning at age 61 years.  Lung cancer screening. / Every year if you are aged 36-80 years and have a 30-pack-year history of smoking and currently smoke or have quit within the past 15 years. Yearly screening is stopped once you have quit smoking for at least 15 years or develop a health problem that would prevent you from having lung cancer treatment.  Clinical breast exam.** / Every year after age 42 years.  BRCA-related cancer risk assessment.** / For women who have family members with a BRCA-related cancer (breast, ovarian, tubal, or peritoneal cancers).  Mammogram.** / Every year beginning at age 85 years and continuing for as long as you are in good health. Consult with your health care provider.  Pap test.** / Every 3 years starting at age 13 years through age 11 or 75 years with a history of 3 consecutive normal Pap tests.  HPV screening.** / Every 3 years from ages 49 years through ages 41 to 65 years with a history of 3 consecutive normal Pap tests.  Fecal occult blood test (FOBT) of stool. / Every year beginning at age 56 years and continuing until age 52 years. You may not need to do this test if you get a colonoscopy every 10 years.  Flexible sigmoidoscopy or colonoscopy.** / Every 5 years for a flexible sigmoidoscopy or every 10 years for a colonoscopy beginning at age 18 years and continuing until age 27 years.  Hepatitis C blood test.** / For all people  born from [birth year] through 1965 and any individual with known risks for hepatitis C.  Skin self-exam. / Monthly.  Influenza vaccine. / Every year.  Tetanus, diphtheria, and acellular pertussis (Tdap/Td) vaccine.** / Consult your health care provider. Pregnant women should receive 1 dose of Tdap vaccine during each pregnancy. 1 dose of Td every 10 years.  Varicella vaccine.** / Consult your health care provider. Pregnant females who do not have evidence of immunity should receive the first dose after pregnancy.  Zoster vaccine.** / 1 dose for adults aged 41 years or older.  Measles, mumps, rubella (MMR) vaccine.** / You need at least 1 dose of MMR if you were born in 1957 or later. You may also need a 2nd dose. For females of childbearing age, rubella immunity should be determined. If there is no evidence of immunity, females who are not pregnant should be vaccinated. If there is no evidence of immunity, females who are pregnant should delay immunization until after pregnancy.  Pneumococcal 13-valent conjugate (PCV13) vaccine.** / Consult your health care provider.  Pneumococcal polysaccharide (PPSV23) vaccine.** / 1 to 2 doses if you smoke cigarettes or if you have certain conditions.  Meningococcal vaccine.** / Consult your health care provider.  Hepatitis A vaccine.** / Consult your health care provider.  Hepatitis B vaccine.** / Consult your health care provider.  Haemophilus influenzae type b (Hib) vaccine.** /  Consult your health care provider. Ages 71 years and over  Blood pressure check.** / Every 1 to 2 years.  Lipid and cholesterol check.** / Every 5 years beginning at age 27 years.  Lung cancer screening. / Every year if you are aged 101-80 years and have a 30-pack-year history of smoking and currently smoke or have quit within the past 15 years. Yearly screening is stopped once you have quit smoking for at least 15 years or develop a health problem that would prevent you from  having lung cancer treatment.  Clinical breast exam.** / Every year after age 27 years.  BRCA-related cancer risk assessment.** / For women who have family members with a BRCA-related cancer (breast, ovarian, tubal, or peritoneal cancers).  Mammogram.** / Every year beginning at age 39 years and continuing for as long as you are in good health. Consult with your health care provider.  Pap test.** / Every 3 years starting at age 1 years through age 71 or 21 years with 3 consecutive normal Pap tests. Testing can be stopped between 65 and 70 years with 3 consecutive normal Pap tests and no abnormal Pap or HPV tests in the past 10 years.  HPV screening.** / Every 3 years from ages 98 years through ages 23 or 70 years with a history of 3 consecutive normal Pap tests. Testing can be stopped between 65 and 70 years with 3 consecutive normal Pap tests and no abnormal Pap or HPV tests in the past 10 years.  Fecal occult blood test (FOBT) of stool. / Every year beginning at age 54 years and continuing until age 74 years. You may not need to do this test if you get a colonoscopy every 10 years.  Flexible sigmoidoscopy or colonoscopy.** / Every 5 years for a flexible sigmoidoscopy or every 10 years for a colonoscopy beginning at age 67 years and continuing until age 8 years.  Hepatitis C blood test.** / For all people born from [birth year] through 1963 and any individual with known risks for hepatitis C.  Osteoporosis screening.** / A one-time screening for women ages 32 years and over and women at risk for fractures or osteoporosis.  Skin self-exam. / Monthly.  Influenza vaccine. / Every year.  Tetanus, diphtheria, and acellular pertussis (Tdap/Td) vaccine.** / 1 dose of Td every 10 years.  Varicella vaccine.** / Consult your health care provider.  Zoster vaccine.** / 1 dose for adults aged 84 years or older.  Pneumococcal 13-valent conjugate (PCV13) vaccine.** / Consult your health care  provider.  Pneumococcal polysaccharide (PPSV23) vaccine.** / 1 dose for all adults aged 75 years and older.  Meningococcal vaccine.** / Consult your health care provider.  Hepatitis A vaccine.** / Consult your health care provider.  Hepatitis B vaccine.** / Consult your health care provider.  Haemophilus influenzae type b (Hib) vaccine.** / Consult your health care provider. ** Family history and personal history of risk and conditions may change your health care provider's recommendations. Document Released: 04/04/2001 Document Revised: 06/23/2013 Document Reviewed: 07/04/2010 [facility] Patient Information 2015 [place], [place]. This information is not intended to replace advice given to you by your health care provider. Make sure you discuss any questions you have with your health care provider.

## 2014-09-29 NOTE — Assessment & Plan Note (Signed)
8/16 acute - [facility]

## 2014-09-29 NOTE — Assessment & Plan Note (Signed)
Here for medicare wellness/physical  Diet: heart healthy  Physical activity: not sedentary  Depression/mood screen: negative per pt; grieving. Lindsay Maldonado died in Apr 07, 2014 Hearing: intact to whispered voice  Visual acuity: grossly normal, performs annual eye exam  ADLs: capable  Fall risk: none  Home safety: good  Cognitive evaluation: intact to orientation, naming, recall and repetition  EOL planning: adv directives, full code/ I agree  I have personally reviewed and have noted  1. The patient's medical and social history  2. Their use of alcohol, tobacco or illicit drugs  3. Their current medications and supplements  4. The patient's functional ability including ADL's, fall risks, home safety risks and hearing or visual impairment.  5. Diet and physical activities  6. Evidence for depression or mood disorders 7. Providers roster reviewed    Today patient counseled on age appropriate routine health concerns for screening and prevention, each reviewed and up to date or declined. Immunizations reviewed and up to date or declined. Labs ordered and reviewed. Risk factors for depression reviewed and negative. Hearing function and visual acuity are intact. ADLs screened and addressed as needed. Functional ability and level of safety reviewed and appropriate. Education, counseling and referrals performed based on assessed risks today. Patient provided with a copy of personalized plan for preventive services.

## 2014-09-29 NOTE — Telephone Encounter (Signed)
Received call from pharmacist stating that they do not carry the vicoprofen 5/200 wanting to see if they can change strength 7.5/200 mg...Lindsay Maldonado

## 2014-09-29 NOTE — Telephone Encounter (Signed)
Ok Pls change instructions to 1/2-1 po tid prn Thx

## 2014-09-29 NOTE — Progress Notes (Signed)
Pre visit review using our clinic review tool, if applicable. No additional management support is needed unless otherwise documented below in the visit note. 

## 2014-09-30 NOTE — Telephone Encounter (Signed)
Notified pharmacy spoke with John/pharmacist gave him md response. Updated epic...Lindsay Maldonado

## 2015-03-31 ENCOUNTER — Ambulatory Visit (INDEPENDENT_AMBULATORY_CARE_PROVIDER_SITE_OTHER): Payer: Medicare Other | Admitting: Internal Medicine

## 2015-03-31 ENCOUNTER — Encounter: Payer: Self-pay | Admitting: Internal Medicine

## 2015-03-31 VITALS — BP 118/72 | HR 86 | Wt 116.0 lb

## 2015-03-31 DIAGNOSIS — E034 Atrophy of thyroid (acquired): Secondary | ICD-10-CM

## 2015-03-31 DIAGNOSIS — M545 Low back pain, unspecified: Secondary | ICD-10-CM

## 2015-03-31 DIAGNOSIS — E039 Hypothyroidism, unspecified: Secondary | ICD-10-CM

## 2015-03-31 DIAGNOSIS — Z0000 Encounter for general adult medical examination without abnormal findings: Secondary | ICD-10-CM | POA: Diagnosis not present

## 2015-03-31 DIAGNOSIS — E785 Hyperlipidemia, unspecified: Secondary | ICD-10-CM

## 2015-03-31 MED ORDER — SYNTHROID 75 MCG PO TABS: 75.0000 ug | ORAL_TABLET | Freq: Every day | ORAL | Status: DC

## 2015-03-31 MED ORDER — LORAZEPAM 1 MG PO TABS: 1.0000 mg | ORAL_TABLET | Freq: Two times a day (BID) | ORAL | Status: DC | PRN

## 2015-03-31 NOTE — Assessment & Plan Note (Signed)
Chronic  Levothroid

## 2015-03-31 NOTE — Progress Notes (Signed)
Subjective:  Patient ID: Lindsay Maldonado, female    DOB: May 31, 1934  Age: 81 y.o. MRN: [id number]  CC: No chief complaint on file.   HPI MELANYE HILLIGOSS presents for hypothyroidism, anxiety, insomnia f/u  Outpatient Prescriptions Prior to Visit  Medication Sig Dispense Refill  . Cholecalciferol (VITAMIN D3) 1000 UNITS tablet Take 1,000 Units by mouth daily.      [name] LORazepam (ATIVAN) 1 MG tablet TAKE 1 TABLET BY MOUTH 2 TIMES DAILY AS NEEDED 60 tablet 2  . SYNTHROID 75 MCG tablet Take 1 tablet (75 mcg total) by mouth daily. 90 tablet 3  . acyclovir (ZOVIRAX) 800 MG tablet Take 1 tablet (800 mg total) by mouth 5 (five) times daily. (Patient not taking: Reported on 03/31/2015) 50 tablet 0  . HYDROcodone-ibuprofen (VICOPROFEN) 7.5-200 MG per tablet Take by mouth 3 (three) times daily as needed for moderate pain. Reported on 03/31/2015     No facility-administered medications prior to visit.    ROS Review of Systems  Constitutional: Negative for chills, activity change, appetite change, fatigue and unexpected weight change.  HENT: Negative for congestion, mouth sores and sinus pressure.   Eyes: Negative for visual disturbance.  Respiratory: Negative for cough and chest tightness.   Gastrointestinal: Negative for nausea and abdominal pain.  Genitourinary: Negative for frequency, difficulty urinating and vaginal pain.  Musculoskeletal: Negative for back pain and gait problem.  Skin: Negative for pallor and rash.  Neurological: Negative for dizziness, tremors, weakness, numbness and headaches.  Psychiatric/Behavioral: Positive for sleep disturbance. Negative for suicidal ideas and confusion. The patient is not nervous/anxious.     Objective:  BP 118/72 mmHg  Pulse 103  Wt 116 lb (52.617 kg)  SpO2 96%  BP Readings from Last 3 Encounters:  03/31/15 118/72  09/29/14 138/90  09/25/13 137/80    Wt Readings from Last 3 Encounters:  03/31/15 116 lb (52.617 kg)  09/29/14 116 lb  (52.617 kg)  09/25/13 129 lb (58.514 kg)    Physical Exam  Constitutional: She appears well-developed. No distress.  HENT:  Head: Normocephalic.  Right Ear: External ear normal.  Left Ear: External ear normal.  Nose: Nose normal.  Mouth/Throat: Oropharynx is clear and moist.  Eyes: Conjunctivae are normal. Pupils are equal, round, and reactive to light. Right eye exhibits no discharge. Left eye exhibits no discharge.  Neck: Normal range of motion. Neck supple. No JVD present. No tracheal deviation present. No thyromegaly present.  Cardiovascular: Normal rate, regular rhythm and normal heart sounds.   Pulmonary/Chest: No stridor. No respiratory distress. She has no wheezes.  Abdominal: Soft. Bowel sounds are normal. She exhibits no distension and no mass. There is no tenderness. There is no rebound and no guarding.  Musculoskeletal: She exhibits no edema or tenderness.  Lymphadenopathy:    She has no cervical adenopathy.  Neurological: She displays normal reflexes. No cranial nerve deficit. She exhibits normal muscle tone. Coordination normal.  Skin: No rash noted. No erythema.  Psychiatric: She has a normal mood and affect. Her behavior is normal. Judgment and thought content normal.    Lab Results  Component Value Date   WBC 5.4 09/29/2014   HGB 14.0 09/29/2014   HCT 42.1 09/29/2014   PLT 191.0 09/29/2014   GLUCOSE 101* 09/29/2014   CHOL 183 09/29/2014   TRIG 51.0 09/29/2014   HDL 95.50 09/29/2014   LDLCALC 78 09/29/2014   ALT 17 09/29/2014   AST 16 09/29/2014   NA 139 09/29/2014   K  4.5 09/29/2014   CL 103 09/29/2014   CREATININE 0.65 09/29/2014   BUN 13 09/29/2014   CO2 32 09/29/2014   TSH 1.75 09/29/2014    Mm Screening Breast Tomo Bilateral  09/29/2013  CLINICAL DATA:  Screening. EXAM: DIGITAL SCREENING BILATERAL MAMMOGRAM WITH 3D TOMO WITH CAD DIGITAL BREAST TOMOSYNTHESIS Digital breast tomosynthesis images are acquired in two projections. These images are  reviewed in combination with the digital mammogram, confirming the findings below. COMPARISON:  Previous exam(s). ACR Breast Density Category b: There are scattered areas of fibroglandular density. FINDINGS: There are no findings suspicious for malignancy. Images were processed with CAD. IMPRESSION: No mammographic evidence of malignancy. A result letter of this screening mammogram will be mailed directly to the patient. RECOMMENDATION: Screening mammogram in one year. (Code:SM-B-01Y) BI-RADS CATEGORY  1: Negative. Electronically Signed   By: Luberta Robertson M.D.   On: 09/29/2013 12:52    Assessment & Plan:   Diagnoses and all orders for this visit:  Medicare annual wellness visit, subsequent -     SYNTHROID 75 MCG tablet; Take 1 tablet (75 mcg total) by mouth daily.  Right-sided low back pain without sciatica -     SYNTHROID 75 MCG tablet; Take 1 tablet (75 mcg total) by mouth daily.  Dyslipidemia -     SYNTHROID 75 MCG tablet; Take 1 tablet (75 mcg total) by mouth daily.  Hypothyroidism due to acquired atrophy of thyroid -     SYNTHROID 75 MCG tablet; Take 1 tablet (75 mcg total) by mouth daily.  Other orders -     LORazepam (ATIVAN) 1 MG tablet; Take 1 tablet (1 mg total) by mouth 2 (two) times daily as needed for anxiety.   I have discontinued Ms. Hartl's acyclovir and HYDROcodone-ibuprofen. I am also having her maintain her cholecalciferol, LORazepam, and SYNTHROID.  No orders of the defined types were placed in this encounter.     Follow-up: No Follow-up on file.  Walker Kehr, MD

## 2015-03-31 NOTE — Progress Notes (Signed)
Pre visit review using our clinic review tool, if applicable. No additional management support is needed unless otherwise documented below in the visit note. 

## 2015-04-01 ENCOUNTER — Ambulatory Visit: Payer: Medicare Other | Admitting: Internal Medicine

## 2015-08-04 ENCOUNTER — Other Ambulatory Visit: Payer: Self-pay | Admitting: Internal Medicine

## 2015-08-04 DIAGNOSIS — Z1231 Encounter for screening mammogram for malignant neoplasm of breast: Secondary | ICD-10-CM

## 2015-09-07 ENCOUNTER — Ambulatory Visit: Payer: Medicare Other

## 2015-09-08 ENCOUNTER — Ambulatory Visit
Admission: RE | Admit: 2015-09-08 | Discharge: 2015-09-08 | Disposition: A | Discharge status: Home / Self Care | Payer: Medicare Other | Source: Ambulatory Visit | Attending: Internal Medicine | Admitting: Internal Medicine

## 2015-09-08 DIAGNOSIS — Z1231 Encounter for screening mammogram for malignant neoplasm of breast: Secondary | ICD-10-CM

## 2015-09-23 ENCOUNTER — Other Ambulatory Visit (INDEPENDENT_AMBULATORY_CARE_PROVIDER_SITE_OTHER): Payer: Medicare Other

## 2015-09-23 DIAGNOSIS — E038 Other specified hypothyroidism: Secondary | ICD-10-CM

## 2015-09-23 DIAGNOSIS — Z0000 Encounter for general adult medical examination without abnormal findings: Secondary | ICD-10-CM

## 2015-09-23 DIAGNOSIS — E034 Atrophy of thyroid (acquired): Secondary | ICD-10-CM

## 2015-09-23 DIAGNOSIS — E785 Hyperlipidemia, unspecified: Secondary | ICD-10-CM | POA: Diagnosis not present

## 2015-09-23 DIAGNOSIS — M545 Low back pain, unspecified: Secondary | ICD-10-CM

## 2015-09-23 LAB — HEPATIC FUNCTION PANEL
ALK PHOS: 58 U/L (ref 39–117)
ALT: 21 U/L (ref 0–35)
AST: 21 U/L (ref 0–37)
Albumin: 4.2 g/dL (ref 3.5–5.2)
BILIRUBIN DIRECT: 0.1 mg/dL (ref 0.0–0.3)
TOTAL PROTEIN: 6.9 g/dL (ref 6.0–8.3)
Total Bilirubin: 0.6 mg/dL (ref 0.2–1.2)

## 2015-09-23 LAB — CBC WITH DIFFERENTIAL/PLATELET
BASOS PCT: 0.3 % (ref 0.0–3.0)
Basophils Absolute: 0 10*3/uL (ref 0.0–0.1)
EOS PCT: 3.9 % (ref 0.0–5.0)
Eosinophils Absolute: 0.2 10*3/uL (ref 0.0–0.7)
HEMATOCRIT: 41.5 % (ref 36.0–46.0)
HEMOGLOBIN: 13.9 g/dL (ref 12.0–15.0)
Lymphocytes Relative: 22.2 % (ref 12.0–46.0)
Lymphs Abs: 0.9 10*3/uL (ref 0.7–4.0)
MCHC: 33.5 g/dL (ref 30.0–36.0)
MCV: 88.1 fl (ref 78.0–100.0)
MONO ABS: 0.4 10*3/uL (ref 0.1–1.0)
MONOS PCT: 9.4 % (ref 3.0–12.0)
Neutro Abs: 2.7 10*3/uL (ref 1.4–7.7)
Neutrophils Relative %: 64.2 % (ref 43.0–77.0)
Platelets: 171 10*3/uL (ref 150.0–400.0)
RBC: 4.72 Mil/uL (ref 3.87–5.11)
RDW: 13.7 % (ref 11.5–15.5)
WBC: 4.2 10*3/uL (ref 4.0–10.5)

## 2015-09-23 LAB — URINALYSIS
Bilirubin Urine: NEGATIVE
Hgb urine dipstick: NEGATIVE
KETONES UR: NEGATIVE
Leukocytes, UA: NEGATIVE
Nitrite: NEGATIVE
SPECIFIC GRAVITY, URINE: 1.01 (ref 1.000–1.030)
Total Protein, Urine: NEGATIVE
URINE GLUCOSE: NEGATIVE
UROBILINOGEN UA: 0.2 (ref 0.0–1.0)
pH: 7 (ref 5.0–8.0)

## 2015-09-23 LAB — BASIC METABOLIC PANEL
BUN: 10 mg/dL (ref 6–23)
CO2: 30 mEq/L (ref 19–32)
Calcium: 9.7 mg/dL (ref 8.4–10.5)
Chloride: 104 mEq/L (ref 96–112)
Creatinine, Ser: 0.66 mg/dL (ref 0.40–1.20)
GFR: 91.72 mL/min (ref >60.00)
Glucose, Bld: 95 mg/dL (ref 70–99)
Potassium: 4.7 mEq/L (ref 3.5–5.1)
SODIUM: 139 meq/L (ref 135–145)

## 2015-09-23 LAB — LIPID PANEL
CHOL/HDL RATIO: 2
Cholesterol: 176 mg/dL (ref 0–200)
HDL: 88.3 mg/dL (ref >39.00)
LDL Cholesterol: 78 mg/dL (ref 0–99)
NONHDL: 87.94
Triglycerides: 51 mg/dL (ref 0.0–149.0)
VLDL: 10.2 mg/dL (ref 0.0–40.0)

## 2015-09-23 LAB — TSH: TSH: 3.74 u[IU]/mL (ref 0.35–4.50)

## 2015-09-29 ENCOUNTER — Encounter: Payer: Medicare Other | Admitting: Internal Medicine

## 2015-09-29 ENCOUNTER — Encounter: Payer: Self-pay | Admitting: Internal Medicine

## 2015-09-29 NOTE — Progress Notes (Signed)
Pre visit review using our clinic review tool, if applicable. No additional management support is needed unless otherwise documented below in the visit note. 

## 2015-10-11 ENCOUNTER — Encounter: Payer: Medicare Other | Admitting: Internal Medicine

## 2015-10-12 ENCOUNTER — Ambulatory Visit (INDEPENDENT_AMBULATORY_CARE_PROVIDER_SITE_OTHER): Payer: Medicare Other | Admitting: Internal Medicine

## 2015-10-12 ENCOUNTER — Encounter: Payer: Self-pay | Admitting: Internal Medicine

## 2015-10-12 VITALS — BP 130/80 | HR 73 | Ht 63.0 in | Wt 117.0 lb

## 2015-10-12 DIAGNOSIS — L570 Actinic keratosis: Secondary | ICD-10-CM | POA: Diagnosis not present

## 2015-10-12 DIAGNOSIS — M545 Low back pain, unspecified: Secondary | ICD-10-CM

## 2015-10-12 DIAGNOSIS — E038 Other specified hypothyroidism: Secondary | ICD-10-CM

## 2015-10-12 DIAGNOSIS — Z1211 Encounter for screening for malignant neoplasm of colon: Secondary | ICD-10-CM

## 2015-10-12 DIAGNOSIS — E042 Nontoxic multinodular goiter: Secondary | ICD-10-CM

## 2015-10-12 DIAGNOSIS — M797 Fibromyalgia: Secondary | ICD-10-CM

## 2015-10-12 DIAGNOSIS — E785 Hyperlipidemia, unspecified: Secondary | ICD-10-CM

## 2015-10-12 DIAGNOSIS — E030 Congenital hypothyroidism with diffuse goiter: Secondary | ICD-10-CM | POA: Diagnosis not present

## 2015-10-12 DIAGNOSIS — Z0000 Encounter for general adult medical examination without abnormal findings: Secondary | ICD-10-CM

## 2015-10-12 DIAGNOSIS — E034 Atrophy of thyroid (acquired): Secondary | ICD-10-CM

## 2015-10-12 DIAGNOSIS — Z23 Encounter for immunization: Secondary | ICD-10-CM | POA: Diagnosis not present

## 2015-10-12 DIAGNOSIS — Z0001 Encounter for general adult medical examination with abnormal findings: Secondary | ICD-10-CM | POA: Diagnosis not present

## 2015-10-12 DIAGNOSIS — N951 Menopausal and female climacteric states: Secondary | ICD-10-CM

## 2015-10-12 MED ORDER — LORAZEPAM 1 MG PO TABS: 1.0000 mg | ORAL_TABLET | Freq: Two times a day (BID) | ORAL | 4 refills | Status: DC | PRN

## 2015-10-12 MED ORDER — SYNTHROID 75 MCG PO TABS: 75.0000 ug | ORAL_TABLET | Freq: Every day | ORAL | 3 refills | Status: DC

## 2015-10-12 MED ORDER — LORATADINE 10 MG PO TABS: 10.0000 mg | ORAL_TABLET | Freq: Every day | ORAL | 3 refills | Status: DC

## 2015-10-12 NOTE — Assessment & Plan Note (Signed)
Cryo x 3

## 2015-10-12 NOTE — Assessment & Plan Note (Signed)
S/p remote thyroidectomy

## 2015-10-12 NOTE — Progress Notes (Signed)
Subjective:  Patient ID: Lindsay Maldonado, female    DOB: 21-Dec-1934  Age: 81 y.o. MRN: [id number]  CC: No chief complaint on file.   HPI SUDE BYERS presents for a well exam  Outpatient Medications Prior to Visit  Medication Sig Dispense Refill  . Cholecalciferol (VITAMIN D3) 1000 UNITS tablet Take 1,000 Units by mouth daily.      [name] LORazepam (ATIVAN) 1 MG tablet Take 1 tablet (1 mg total) by mouth 2 (two) times daily as needed for anxiety. 60 tablet 4  . SYNTHROID 75 MCG tablet Take 1 tablet (75 mcg total) by mouth daily. 90 tablet 3   No facility-administered medications prior to visit.     ROS Review of Systems  Constitutional: Negative for activity change, appetite change, chills, fatigue and unexpected weight change.  HENT: Negative for congestion, mouth sores and sinus pressure.   Eyes: Negative for visual disturbance.  Respiratory: Negative for cough and chest tightness.   Gastrointestinal: Negative for abdominal pain and nausea.  Genitourinary: Negative for difficulty urinating, frequency and vaginal pain.  Musculoskeletal: Negative for back pain and gait problem.  Skin: Negative for pallor and rash.  Neurological: Negative for dizziness, tremors, weakness, numbness and headaches.  Psychiatric/Behavioral: Negative for confusion, sleep disturbance and suicidal ideas. The patient is nervous/anxious.     Objective:  BP 130/80   Pulse 73   Ht 5\' 3"  (1.6 m)   Wt 117 lb (53.1 kg)   SpO2 96%   BMI 20.73 kg/m   BP Readings from Last 3 Encounters:  10/12/15 130/80  03/31/15 118/72  09/29/14 138/90    Wt Readings from Last 3 Encounters:  10/12/15 117 lb (53.1 kg)  03/31/15 116 lb (52.6 kg)  09/29/14 116 lb (52.6 kg)    Physical Exam  Constitutional: She appears well-developed. No distress.  HENT:  Head: Normocephalic.  Right Ear: External ear normal.  Left Ear: External ear normal.  Nose: Nose normal.  Mouth/Throat: Oropharynx is clear and moist.    Eyes: Conjunctivae are normal. Pupils are equal, round, and reactive to light. Right eye exhibits no discharge. Left eye exhibits no discharge.  Neck: Normal range of motion. Neck supple. No JVD present. No tracheal deviation present. No thyromegaly present.  Cardiovascular: Normal rate, regular rhythm and normal heart sounds.   Pulmonary/Chest: No stridor. No respiratory distress. She has no wheezes.  Abdominal: Soft. Bowel sounds are normal. She exhibits no distension and no mass. There is no tenderness. There is no rebound and no guarding.  Musculoskeletal: She exhibits no edema or tenderness.  Lymphadenopathy:    She has no cervical adenopathy.  Neurological: She displays normal reflexes. No cranial nerve deficit. She exhibits normal muscle tone. Coordination normal.  Skin: No rash noted. No erythema.  Psychiatric: She has a normal mood and affect. Her behavior is normal. Judgment and thought content normal.  thyroidect scar Face AKs   Procedure Note :     Procedure : Cryosurgery   Indication:  Actinic keratosis(es) x 3 on face   Risks including unsuccessful procedure , bleeding, infection, bruising, scar, a need for a repeat  procedure and others were explained to the patient in detail as well as the benefits. Informed consent was obtained verbally.    3 lesion(s)  on  forehead was/were treated with liquid nitrogen on a Q-tip in a usual fasion . Band-Aid was applied and antibiotic ointment was given for a later use.   Tolerated well. Complications none.  Lab Results  Component Value Date   WBC 4.2 09/23/2015   HGB 13.9 09/23/2015   HCT 41.5 09/23/2015   PLT 171.0 09/23/2015   GLUCOSE 95 09/23/2015   CHOL 176 09/23/2015   TRIG 51.0 09/23/2015   HDL 88.30 09/23/2015   LDLCALC 78 09/23/2015   ALT 21 09/23/2015   AST 21 09/23/2015   NA 139 09/23/2015   K 4.7 09/23/2015   CL 104 09/23/2015   CREATININE 0.66 09/23/2015   BUN 10 09/23/2015   CO2 30 09/23/2015    TSH 3.74 09/23/2015    Mm Screening Breast Tomo Bilateral  Result Date: 09/09/2015 CLINICAL DATA:  Screening. EXAM: 2D DIGITAL SCREENING BILATERAL MAMMOGRAM WITH CAD AND ADJUNCT TOMO COMPARISON:  Previous exam(s). ACR Breast Density Category b: There are scattered areas of fibroglandular density. FINDINGS: There are no findings suspicious for malignancy. Images were processed with CAD. IMPRESSION: No mammographic evidence of malignancy. A result letter of this screening mammogram will be mailed directly to the patient. RECOMMENDATION: Screening mammogram in one year. (Code:SM-B-01Y) BI-RADS CATEGORY  1: Negative. Electronically Signed   By: Pamelia Hoit M.D.   On: 09/09/2015 08:15    Assessment & Plan:   There are no diagnoses linked to this encounter. I am having Ms. Noble maintain her cholecalciferol, LORazepam, and SYNTHROID.  No orders of the defined types were placed in this encounter.    Follow-up: No Follow-up on file.  Walker Kehr, MD

## 2015-10-12 NOTE — Assessment & Plan Note (Signed)
Mild Starting yoga

## 2015-10-12 NOTE — Progress Notes (Signed)
Subjective:   Lindsay Maldonado is a 81 y.o. female who presents for Medicare Annual (Subsequent) preventive examination.  HRA assessment completed during this visit with Lindsay Maldonado  The Patient was informed that the wellness visit is to identify future health risk and educate and initiate measures that can reduce risk for increased disease through the lifespan.     Psychosocial: (mother had cancer (breast and mets to liver); Father had Parkinson)  Had breast cyst;  Osteopenia: not taking the medication/ Vit D qod; Discussed strength building;  Lost spouse x 20 yo from massive MI 5 children; 53 grands; 8 are married 11 great grand child  Kids are there for her  99 in back ground as well  Son and dtr in Sports coach have moved in;   [place]; no issues (Lipids chol 176; Trig 51, HDL 88; LDL 78)  Glucose 95;   Medications reviewed for issues; compliance; otc meds  BMI: 20   Diet: adequate at present   Exercise;   Trying to start yoga;  More stiff[facility] [place] [facility];   [place] term plan reviewed / Has son and dtr in law now;  Home is large; 1st level bedroom; Basement finished;  Married x 37 years Thinking about transitions; son and dtr is still living in home;  Son is a Art therapist and can assist with LTC planning   Home: level; barriers; or needs identified as bathroom railing or other review; Fall hx; no  Risk for Depression reviewed: Any emotional problems? Anxious, depressed, irritable, sad or blue? Grief coming in waves but receding; enjoys family Tries to keep going places her and spouse went and keep doing; Still has the same friends.  Denies feeling depressed or hopeless; voices pleasure in daily life How many social activities have you been engaged in within the last 2 weeks? no  Cognitive;  Manages checkbook, medications; no failures of task Ad8 score reviewed for issues;  Issues making decisions; no  Less  interest in hobbies / activities" no  Repeats questions, stories; family complaining: NO  Trouble using ordinary gadgets; microwave; computer: no  Forgets the month or year: no  Mismanaging finances: no  Missing apt: no but does write them down  Daily problems with thinking of memory NO Ad8 score is 0  MMSE not appropriate unless AD8 score is > 2   Advanced Directive addressed; yes   Counseling Health Maintenance Gaps:  Colonoscopy; 5/20016 / will try cologuard;  EKG: 07/21014 Mammogram: had about one month ago;  [facility]; [facility];  Dexa/ 10/2002/ DUE again/ declines PAP: educated regarding the need for GYN exam;  GYN; does not go now  Hearing: 2000hz  both ears   Ophthalmology exam: eye apt in June of last year;  TBS this year Dr. Einar Gip  During the has; eyes are "sandy"      Immunizations Due: (Vaccines reviewed and educated regarding any overdue)  PSV 23; taken today Tdap / discussed; will take tetanus;  FLU/ declined   Established and updated Risk reviewed and appropriate referral made or health recommendations:  Current Care Team reviewed and updated   Cardiac Risk Factors include: advanced age (>52men, >68 women)     Objective:     Vitals: BP 130/80   Pulse 73   Ht 5\' 3"  (1.6 m)   Wt 117 lb (53.1 kg)   SpO2 96%   BMI 20.73 kg/m   Body mass index is  20.73 kg/m.   Tobacco History  Smoking Status  . Never Smoker  Smokeless Tobacco  . Never Used     Counseling given: Yes   Past Medical History:  Diagnosis Date  . Cyst of breast   . Fibromyalgia   . Goiter    multinodular  . Hypothyroidism   . Osteopenia    Past Surgical History:  Procedure Laterality Date  . ENDOMETRIAL BIOPSY  2008   Dr. Deatra Ina  . ROTATOR CUFF REPAIR     Left  . THYROID SURGERY  2007   Nodule removed   Family History  Problem Relation Age of Onset  . Cancer Mother 68    breast  . Parkinsonism Father    History  Sexual Activity  . Sexual  activity: Not on file    Outpatient Encounter Prescriptions as of 10/12/2015  Medication Sig  . Cholecalciferol (VITAMIN D3) 1000 UNITS tablet Take 1,000 Units by mouth daily.    [name] LORazepam (ATIVAN) 1 MG tablet Take 1 tablet (1 mg total) by mouth 2 (two) times daily as needed for anxiety.  . SYNTHROID 75 MCG tablet Take 1 tablet (75 mcg total) by mouth daily.  . [DISCONTINUED] LORazepam (ATIVAN) 1 MG tablet Take 1 tablet (1 mg total) by mouth 2 (two) times daily as needed for anxiety.  . [DISCONTINUED] SYNTHROID 75 MCG tablet Take 1 tablet (75 mcg total) by mouth daily.  [name] loratadine (CLARITIN) 10 MG tablet Take 1 tablet (10 mg total) by mouth daily.   No facility-administered encounter medications on file as of 10/12/2015.     Activities of Daily Living In your present state of health, do you have any difficulty performing the following activities: 10/12/2015  Hearing? N  Vision? Y  Difficulty concentrating or making decisions? N  Walking or climbing stairs? N  Dressing or bathing? N  Doing errands, shopping? N  Preparing Food and eating ? N  Using the Toilet? N  In the past six months, have you accidently leaked urine? N  Do you have problems with loss of bowel control? N  Managing your Medications? N  Managing your Finances? N  Housekeeping or managing your Housekeeping? N  Some recent data might be hidden    Patient Care Team: Cassandria Anger, MD as PCP - General Alden Hipp, MD (Obstetrics and Gynecology) Clarene Essex, MD (Gastroenterology)    Assessment:     Exercise Activities and Dietary recommendations Current Exercise Habits: Structured exercise class, Type of exercise: strength training/weights, Time (Minutes): 30, Frequency (Times/Week): 2, Weekly Exercise (Minutes/Week): 60, Intensity: Moderate  Goals    . Exercise 150 minutes per week (moderate activity)          Yoga of choice; or pilates      Fall Risk Fall Risk  10/12/2015 10/12/2015 09/29/2014    Falls in the past year? No No No   Depression Screen PHQ 2/9 Scores 10/12/2015 10/12/2015 09/29/2014  PHQ - 2 Score 0 0 0     Cognitive Testing MMSE - Mini Mental State Exam 10/12/2015  Not completed: (No Data)    Immunization History  Administered Date(s) Administered  . Pneumococcal Conjugate-13 09/25/2013  . Pneumococcal Polysaccharide-23 01/09/2005  . Td 02/21/2004  . Zoster 04/29/2009   Screening Tests Health Maintenance  Topic Date Due  . DEXA SCAN  04/23/2001  . TETANUS/TDAP  02/20/2014  . INFLUENZA VACCINE  09/21/2015  . ZOSTAVAX  Completed  . PNA vac Low Risk Adult  Completed  Plan:   To start Yoga this fall! Working on [place] term care plan. Has great resources   During the course of the visit the patient was educated and counseled about the following appropriate screening and preventive services:   Vaccines to include Pneumoccal, Influenza, Hepatitis B, Td, Zostavax, HCV  Electrocardiogram  Cardiovascular Disease  Colorectal cancer screening  Bone density screening  Diabetes screening  Glaucoma screening  Mammography/PAP  Nutrition counseling   Patient Instructions (the written plan) was given to the patient.   Wynetta Fines, RN  10/12/2015

## 2015-10-12 NOTE — Assessment & Plan Note (Signed)
On Levothroid 

## 2015-10-12 NOTE — Assessment & Plan Note (Addendum)
Here for medicare wellness/physical  Diet: heart healthy  Physical activity: not sedentary  Depression/mood screen: negative per pt; grieving. Mr Propes died in 03/19/14 Hearing: intact to whispered voice  Visual acuity: grossly normal, performs annual eye exam  ADLs: capable  Fall risk: none  Home safety: good  Cognitive evaluation: intact to orientation, naming, recall and repetition  EOL planning: adv directives, full code/ I agree  I have personally reviewed and have noted  1. The patient's medical and social history  2. Their use of alcohol, tobacco or illicit drugs  3. Their current medications and supplements  4. The patient's functional ability including ADL's, fall risks, home safety risks and hearing or visual impairment.  5. Diet and physical activities  6. Evidence for depression or mood disorders 7. Providers roster reviewed    Today patient counseled on age appropriate routine health concerns for screening and prevention, each reviewed and up to date or declined. Immunizations reviewed and up to date or declined. Labs ordered and reviewed. Risk factors for depression reviewed and negative. Hearing function and visual acuity are intact. ADLs screened and addressed as needed. Functional ability and level of safety reviewed and appropriate. Education, counseling and referrals performed based on assessed risks today. Patient provided with a copy of personalized plan for preventive services.   Cologuard BDS discussed

## 2015-10-12 NOTE — Patient Instructions (Addendum)
Lindsay Maldonado , Thank you for taking time to come for your Medicare Wellness Visit. I appreciate your ongoing commitment to your health goals. Please review the following plan we discussed and let me know if I can assist you in the future.     These are the goals we discussed: Goals    . Exercise 150 minutes per week (moderate activity)          Yoga of choice; or pilates       This is a list of the screening recommended for you and due dates:  Health Maintenance  Topic Date Due  . DEXA scan (bone density measurement)  04/23/2001  . Tetanus Vaccine  02/20/2014  . Flu Shot  09/21/2015  . Shingles Vaccine  Completed  . Pneumonia vaccines  Completed   Falls can be the main reason people lose their independence Think before you CLIMB  4 things you can do to prevent falls 1. Begin an exercise program to improve your leg strength and balance 2. Ask the doctor to review medicines for fall risk 3. Get an annual eye check up and update your eye glasses;  ([employer] still assist with eyewear:  Reviewed for annual vision exam;[facility] assistance for eyewear is coordinated through [facility]; Please call Cherlyn Labella at [phone] 4. Make your home safer by: Removing clutter and tripping hazzards Putting railing on stairs and adding grab bars to the bathroom  Have good lighting; especially on stairs and at night when getting up to the bathroom   Exercise; including walking can assist with maintaining tone and balance and help prevent falls as you age.     Kegel Exercises The goal of Kegel exercises is to isolate and exercise your pelvic floor muscles. These muscles act as a hammock that supports the rectum, vagina, small intestine, and uterus. As the muscles weaken, the hammock sags and these organs are displaced from their normal positions. Kegel exercises can strengthen your pelvic floor muscles and help you to improve bladder and bowel control, improve sexual response, and help  reduce many problems and some discomfort during pregnancy. Kegel exercises can be done anywhere and at any time. HOW TO PERFORM KEGEL EXERCISES 1. Locate your pelvic floor muscles. To do this, squeeze (contract) the muscles that you use when you try to stop the flow of urine. You will feel a tightness in the vaginal area (women) and a tight lift in the rectal area (men and women). 2. When you begin, contract your pelvic muscles tight for 2-5 seconds, then relax them for 2-5 seconds. This is one set. Do 4-5 sets with a short pause in between. 3. Contract your pelvic muscles for 8-10 seconds, then relax them for 8-10 seconds. Do 4-5 sets. If you cannot contract your pelvic muscles for 8-10 seconds, try 5-7 seconds and work your way up to 8-10 seconds. Your goal is 4-5 sets of 10 contractions each day. Keep your stomach, buttocks, and legs relaxed during the exercises. Perform sets of both short and long contractions. Vary your positions. Perform these contractions 3-4 times per day. Perform sets while you are:   Lying in bed in the morning.  Standing at lunch.  Sitting in the late afternoon.  Lying in bed at night. Important Safety Information ZOSTAVAX does not protect everyone, so some people who get the vaccine may still get Shingles.  You should not get ZOSTAVAX if you are allergic to any of its ingredients, including gelatin or neomycin, have  a weakened immune system, take high doses of steroids, or are pregnant or plan to become pregnant. You should not get ZOSTAVAX to prevent chickenpox.  Talk to your health care professional if you plan to get ZOSTAVAX at the same time as PNEUMOVAX23 (Pneumococcal Vaccine Polyvalent) because it may be better to get these vaccines at least 4 weeks apart.  Possible side effects include redness, pain, itching, swelling, hard lump, warmth, or bruising at the injection site, as well as headache.  ZOSTAVAX contains a weakened chickenpox virus. Tell your health  care professional if you will be in close contact with newborn infants, someone who may be pregnant and has not had chickenpox or been vaccinated against chickenpox, or someone who has problems with their immune system. Your health care professional can tell you what situations you may need to avoid. You are encouraged to report negative side effects of prescription drugs to the FDA. Visit [employer], or call 1-800-FDA-1088.    You should do 40-50 contractions per day. Do not perform more Kegel exercises per day than recommended. Overexercising can cause muscle fatigue. Continue these exercises for for at least 15-20 weeks or as directed by your caregiver.   This information is not intended to replace advice given to you by your health care provider. Make sure you discuss any questions you have with your health care provider.   Document Released: 01/24/2012 Document Revised: 02/27/2014 Document Reviewed: 01/24/2012 Elsevier Interactive Patient Education 2016 [employer].    Postprocedure instructions :     Keep the wounds clean. You can wash them with liquid soap and water. Pat dry with gauze or a Kleenex tissue  Before applying antibiotic ointment and a Band-Aid.   You need to report immediately  if  any signs of infection develop.

## 2015-10-12 NOTE — Progress Notes (Signed)
Pre visit review using our clinic review tool, if applicable. No additional management support is needed unless otherwise documented below in the visit note. 

## 2015-12-27 DIAGNOSIS — Z1211 Encounter for screening for malignant neoplasm of colon: Secondary | ICD-10-CM | POA: Diagnosis not present

## 2015-12-27 DIAGNOSIS — Z1212 Encounter for screening for malignant neoplasm of rectum: Secondary | ICD-10-CM | POA: Diagnosis not present

## 2015-12-29 DIAGNOSIS — H524 Presbyopia: Secondary | ICD-10-CM | POA: Diagnosis not present

## 2015-12-31 LAB — COLOGUARD: Cologuard: NEGATIVE

## 2016-02-18 ENCOUNTER — Telehealth: Payer: Self-pay | Admitting: Internal Medicine

## 2016-02-18 NOTE — Telephone Encounter (Signed)
Pt request to speak to the assistant concern about cologuard result. She said company told her we have the result since 2 wks ago but she never heard anything from our office.

## 2016-02-23 NOTE — Telephone Encounter (Signed)
Pls get the results Thx

## 2016-02-25 NOTE — Telephone Encounter (Signed)
Results were negative. Report printed from [employer]. Pt informed. Copy sent to be signed by PCP and scanned into chart.

## 2016-09-07 ENCOUNTER — Telehealth: Payer: Self-pay | Admitting: Internal Medicine

## 2016-09-07 ENCOUNTER — Other Ambulatory Visit: Payer: Self-pay | Admitting: Internal Medicine

## 2016-09-07 DIAGNOSIS — Z1231 Encounter for screening mammogram for malignant neoplasm of breast: Secondary | ICD-10-CM

## 2016-09-07 DIAGNOSIS — R928 Other abnormal and inconclusive findings on diagnostic imaging of breast: Secondary | ICD-10-CM

## 2016-09-07 NOTE — Telephone Encounter (Signed)
Pt went for her routine mammogram and there were some findings and they need a referral for a [place] and a digital mammogram   Please advise and call once referral entered   [place]

## 2016-09-07 NOTE — Telephone Encounter (Signed)
Imaging ordered, pt notified

## 2016-09-12 ENCOUNTER — Other Ambulatory Visit: Payer: Self-pay | Admitting: Internal Medicine

## 2016-09-12 DIAGNOSIS — N6459 Other signs and symptoms in breast: Secondary | ICD-10-CM

## 2016-09-18 ENCOUNTER — Ambulatory Visit
Admission: RE | Admit: 2016-09-18 | Discharge: 2016-09-18 | Disposition: A | Discharge status: Home / Self Care | Payer: Medicare Other | Source: Ambulatory Visit | Attending: Internal Medicine | Admitting: Internal Medicine

## 2016-09-18 DIAGNOSIS — N6489 Other specified disorders of breast: Secondary | ICD-10-CM | POA: Diagnosis not present

## 2016-09-18 DIAGNOSIS — N6459 Other signs and symptoms in breast: Secondary | ICD-10-CM

## 2016-09-18 DIAGNOSIS — R928 Other abnormal and inconclusive findings on diagnostic imaging of breast: Secondary | ICD-10-CM | POA: Diagnosis not present

## 2016-09-26 ENCOUNTER — Other Ambulatory Visit: Payer: Self-pay | Admitting: Internal Medicine

## 2016-09-26 DIAGNOSIS — M545 Low back pain, unspecified: Secondary | ICD-10-CM

## 2016-09-26 DIAGNOSIS — E785 Hyperlipidemia, unspecified: Secondary | ICD-10-CM

## 2016-09-26 DIAGNOSIS — E034 Atrophy of thyroid (acquired): Secondary | ICD-10-CM

## 2016-09-26 DIAGNOSIS — Z0000 Encounter for general adult medical examination without abnormal findings: Secondary | ICD-10-CM

## 2016-10-10 ENCOUNTER — Other Ambulatory Visit: Payer: Self-pay | Admitting: Internal Medicine

## 2016-10-10 DIAGNOSIS — R928 Other abnormal and inconclusive findings on diagnostic imaging of breast: Secondary | ICD-10-CM

## 2016-10-13 ENCOUNTER — Encounter: Payer: Self-pay | Admitting: Internal Medicine

## 2016-10-13 ENCOUNTER — Ambulatory Visit (INDEPENDENT_AMBULATORY_CARE_PROVIDER_SITE_OTHER): Payer: Medicare Other | Admitting: Internal Medicine

## 2016-10-13 ENCOUNTER — Other Ambulatory Visit (INDEPENDENT_AMBULATORY_CARE_PROVIDER_SITE_OTHER): Payer: Medicare Other

## 2016-10-13 VITALS — BP 132/84 | HR 65 | Temp 98.2°F | Ht 63.0 in | Wt 118.0 lb

## 2016-10-13 DIAGNOSIS — L570 Actinic keratosis: Secondary | ICD-10-CM

## 2016-10-13 DIAGNOSIS — Z0000 Encounter for general adult medical examination without abnormal findings: Secondary | ICD-10-CM

## 2016-10-13 DIAGNOSIS — G8929 Other chronic pain: Secondary | ICD-10-CM

## 2016-10-13 DIAGNOSIS — Z23 Encounter for immunization: Secondary | ICD-10-CM | POA: Diagnosis not present

## 2016-10-13 DIAGNOSIS — E559 Vitamin D deficiency, unspecified: Secondary | ICD-10-CM | POA: Diagnosis not present

## 2016-10-13 DIAGNOSIS — R634 Abnormal weight loss: Secondary | ICD-10-CM

## 2016-10-13 DIAGNOSIS — E034 Atrophy of thyroid (acquired): Secondary | ICD-10-CM

## 2016-10-13 DIAGNOSIS — M545 Low back pain: Secondary | ICD-10-CM

## 2016-10-13 DIAGNOSIS — E030 Congenital hypothyroidism with diffuse goiter: Secondary | ICD-10-CM

## 2016-10-13 DIAGNOSIS — R030 Elevated blood-pressure reading, without diagnosis of hypertension: Secondary | ICD-10-CM

## 2016-10-13 DIAGNOSIS — E785 Hyperlipidemia, unspecified: Secondary | ICD-10-CM

## 2016-10-13 DIAGNOSIS — R059 Cough, unspecified: Secondary | ICD-10-CM

## 2016-10-13 DIAGNOSIS — F4321 Adjustment disorder with depressed mood: Secondary | ICD-10-CM

## 2016-10-13 DIAGNOSIS — R05 Cough: Secondary | ICD-10-CM

## 2016-10-13 DIAGNOSIS — IMO0001 Reserved for inherently not codable concepts without codable children: Secondary | ICD-10-CM

## 2016-10-13 LAB — CBC WITH DIFFERENTIAL/PLATELET
BASOS PCT: 0.5 % (ref 0.0–3.0)
Basophils Absolute: 0 10*3/uL (ref 0.0–0.1)
EOS ABS: 0.2 10*3/uL (ref 0.0–0.7)
EOS PCT: 4.7 % (ref 0.0–5.0)
HEMATOCRIT: 41.9 % (ref 36.0–46.0)
Hemoglobin: 13.9 g/dL (ref 12.0–15.0)
LYMPHS ABS: 0.9 10*3/uL (ref 0.7–4.0)
Lymphocytes Relative: 19.8 % (ref 12.0–46.0)
MCHC: 33.1 g/dL (ref 30.0–36.0)
MCV: 91.1 fl (ref 78.0–100.0)
MONOS PCT: 7.4 % (ref 3.0–12.0)
Monocytes Absolute: 0.3 10*3/uL (ref 0.1–1.0)
Neutro Abs: 2.9 10*3/uL (ref 1.4–7.7)
Neutrophils Relative %: 67.6 % (ref 43.0–77.0)
PLATELETS: 176 10*3/uL (ref 150.0–400.0)
RBC: 4.6 Mil/uL (ref 3.87–5.11)
RDW: 13.4 % (ref 11.5–15.5)
WBC: 4.3 10*3/uL (ref 4.0–10.5)

## 2016-10-13 LAB — LIPID PANEL
CHOLESTEROL: 195 mg/dL (ref 0–200)
HDL: 86.1 mg/dL (ref >39.00)
LDL Cholesterol: 95 mg/dL (ref 0–99)
NonHDL: 108.41
Total CHOL/HDL Ratio: 2
Triglycerides: 65 mg/dL (ref 0.0–149.0)
VLDL: 13 mg/dL (ref 0.0–40.0)

## 2016-10-13 LAB — URINALYSIS
BILIRUBIN URINE: NEGATIVE
HGB URINE DIPSTICK: NEGATIVE
Ketones, ur: NEGATIVE
LEUKOCYTES UA: NEGATIVE
NITRITE: NEGATIVE
Specific Gravity, Urine: 1.01 (ref 1.000–1.030)
Total Protein, Urine: NEGATIVE
UROBILINOGEN UA: 0.2 (ref 0.0–1.0)
Urine Glucose: NEGATIVE
pH: 8 (ref 5.0–8.0)

## 2016-10-13 LAB — VITAMIN D 25 HYDROXY (VIT D DEFICIENCY, FRACTURES): VITD: 27.91 ng/mL — ABNORMAL LOW (ref 30.00–100.00)

## 2016-10-13 LAB — BASIC METABOLIC PANEL
BUN: 10 mg/dL (ref 6–23)
CHLORIDE: 103 meq/L (ref 96–112)
CO2: 31 mEq/L (ref 19–32)
Calcium: 9.7 mg/dL (ref 8.4–10.5)
Creatinine, Ser: 0.68 mg/dL (ref 0.40–1.20)
GFR: 88.38 mL/min (ref >60.00)
GLUCOSE: 89 mg/dL (ref 70–99)
POTASSIUM: 3.7 meq/L (ref 3.5–5.1)
SODIUM: 140 meq/L (ref 135–145)

## 2016-10-13 LAB — HEPATIC FUNCTION PANEL
ALBUMIN: 4.3 g/dL (ref 3.5–5.2)
ALT: 16 U/L (ref 0–35)
AST: 17 U/L (ref 0–37)
Alkaline Phosphatase: 55 U/L (ref 39–117)
Bilirubin, Direct: 0.2 mg/dL (ref 0.0–0.3)
TOTAL PROTEIN: 7.1 g/dL (ref 6.0–8.3)
Total Bilirubin: 0.6 mg/dL (ref 0.2–1.2)

## 2016-10-13 LAB — TSH: TSH: 3.07 u[IU]/mL (ref 0.35–4.50)

## 2016-10-13 MED ORDER — SYNTHROID 75 MCG PO TABS: 75.0000 ug | ORAL_TABLET | Freq: Every day | ORAL | 3 refills | Status: DC

## 2016-10-13 MED ORDER — LORAZEPAM 1 MG PO TABS: 1.0000 mg | ORAL_TABLET | Freq: Two times a day (BID) | ORAL | 4 refills | Status: DC | PRN

## 2016-10-13 MED ORDER — ZOSTER VAC RECOMB ADJUVANTED 50 MCG/0.5ML IM SUSR: 0.5000 mL | Freq: Once | INTRAMUSCULAR | 1 refills | Status: AC

## 2016-10-13 MED ORDER — RANITIDINE HCL 150 MG PO TABS: 150.0000 mg | ORAL_TABLET | Freq: Two times a day (BID) | ORAL | 5 refills | Status: DC

## 2016-10-13 MED ORDER — VITAMIN D3 25 MCG (1000 UNIT) PO TABS: 2000.0000 [IU] | ORAL_TABLET | Freq: Every day | ORAL | 3 refills | Status: DC

## 2016-10-13 NOTE — Assessment & Plan Note (Signed)
Coping ok 

## 2016-10-13 NOTE — Assessment & Plan Note (Signed)
Levothroid °Labs °

## 2016-10-13 NOTE — Assessment & Plan Note (Signed)
Here for medicare wellness/physical  Diet: heart healthy  Physical activity: not sedentary  Depression/mood screen: negative  Hearing: intact to whispered voice  Visual acuity: grossly normal, performs annual eye exam  ADLs: capable  Fall risk: low to none  Home safety: good  Cognitive evaluation: intact to orientation, naming, recall and repetition  EOL planning: adv directives, full code/ I agree  I have personally reviewed and have noted  1. The patient's medical, surgical and social history  2. Their use of alcohol, tobacco or illicit drugs  3. Their current medications and supplements  4. The patient's functional ability including ADL's, fall risks, home safety risks and hearing or visual impairment.  5. Diet and physical activities  6. Evidence for depression or mood disorders 7. The roster of all physicians providing medical care to patient - is listed in the Snapshot section of the chart and reviewed today.    Today patient counseled on age appropriate routine health concerns for screening and prevention, each reviewed and up to date or declined. Immunizations reviewed and up to date or declined. Labs ordered and reviewed. Risk factors for depression reviewed and negative. Hearing function and visual acuity are intact. ADLs screened and addressed as needed. Functional ability and level of safety reviewed and appropriate. Education, counseling and referrals performed based on assessed risks today. Patient provided with a copy of personalized plan for preventive services.   cologuard (-) 2017

## 2016-10-13 NOTE — Assessment & Plan Note (Signed)
Try Zantac

## 2016-10-13 NOTE — Assessment & Plan Note (Signed)
BP Readings from Last 3 Encounters:  10/13/16 132/84  10/12/15 130/80  03/31/15 118/72

## 2016-10-13 NOTE — Progress Notes (Signed)
Subjective:  Patient ID: Lindsay Maldonado, female    DOB: 04-22-1934  Age: 82 y.o. MRN: [id number]  CC: No chief complaint on file.   HPI Lindsay Maldonado presents for a well exam C/o mucus in the throat C/o L shoulder pain when laying down on it L chest wall small oval lipoma Breast MRI - pending   Outpatient Medications Prior to Visit  Medication Sig Dispense Refill  . Cholecalciferol (VITAMIN D3) 1000 UNITS tablet Take 1,000 Units by mouth daily.      [name] LORazepam (ATIVAN) 1 MG tablet Take 1 tablet (1 mg total) by mouth 2 (two) times daily as needed for anxiety. 60 tablet 4  . SYNTHROID 75 MCG tablet TAKE 1 TABLET (75 MCG TOTAL) BY MOUTH DAILY. 90 tablet 2  . loratadine (CLARITIN) 10 MG tablet Take 1 tablet (10 mg total) by mouth daily. 100 tablet 3   No facility-administered medications prior to visit.     ROS Review of Systems  Constitutional: Negative for activity change, appetite change, chills, fatigue and unexpected weight change.  HENT: Negative for congestion, mouth sores and sinus pressure.   Eyes: Negative for visual disturbance.  Respiratory: Negative for cough and chest tightness.   Gastrointestinal: Negative for abdominal pain and nausea.  Genitourinary: Negative for difficulty urinating, frequency and vaginal pain.  Musculoskeletal: Negative for back pain and gait problem.  Skin: Negative for pallor and rash.  Neurological: Negative for dizziness, tremors, weakness, numbness and headaches.  Psychiatric/Behavioral: Negative for confusion and sleep disturbance.    Objective:  BP 132/84 (BP Location: Left Arm, Patient Position: Sitting, Cuff Size: Normal)   Pulse 65   Temp 98.2 F (36.8 C) (Oral)   Ht 5\' 3"  (1.6 m)   Wt 118 lb (53.5 kg)   SpO2 99%   BMI 20.90 kg/m   BP Readings from Last 3 Encounters:  10/13/16 132/84  10/12/15 130/80  03/31/15 118/72    Wt Readings from Last 3 Encounters:  10/13/16 118 lb (53.5 kg)  10/12/15 117 lb (53.1 kg)    03/31/15 116 lb (52.6 kg)    Physical Exam  Constitutional: She appears well-developed. No distress.  HENT:  Head: Normocephalic.  Right Ear: External ear normal.  Left Ear: External ear normal.  Nose: Nose normal.  Mouth/Throat: Oropharynx is clear and moist.  Eyes: Pupils are equal, round, and reactive to light. Conjunctivae are normal. Right eye exhibits no discharge. Left eye exhibits no discharge.  Neck: Normal range of motion. Neck supple. No JVD present. No tracheal deviation present. No thyromegaly present.  Cardiovascular: Normal rate, regular rhythm and normal heart sounds.   Pulmonary/Chest: No stridor. No respiratory distress. She has no wheezes.  Abdominal: Soft. Bowel sounds are normal. She exhibits no distension and no mass. There is no tenderness. There is no rebound and no guarding.  Musculoskeletal: She exhibits no edema or tenderness.  Lymphadenopathy:    She has no cervical adenopathy.  Neurological: She displays normal reflexes. No cranial nerve deficit. She exhibits normal muscle tone. Coordination normal.  Skin: No rash noted. No erythema.  Psychiatric: She has a normal mood and affect. Her behavior is normal. Judgment and thought content normal.  R lat foot is bruised Left shoulder is tender a little L lat chest wall with 1 cm oval ?lipoma  Lab Results  Component Value Date   WBC 4.2 09/23/2015   HGB 13.9 09/23/2015   HCT 41.5 09/23/2015   PLT 171.0 09/23/2015   GLUCOSE  95 09/23/2015   CHOL 176 09/23/2015   TRIG 51.0 09/23/2015   HDL 88.30 09/23/2015   LDLCALC 78 09/23/2015   ALT 21 09/23/2015   AST 21 09/23/2015   NA 139 09/23/2015   K 4.7 09/23/2015   CL 104 09/23/2015   CREATININE 0.66 09/23/2015   BUN 10 09/23/2015   CO2 30 09/23/2015   TSH 3.74 09/23/2015    US Breast Ltd Uni Left Inc Axilla  Result Date: 09/18/2016 CLINICAL DATA:  82 year old female complaining of intermittent left nipple inversion. EXAM: 2D DIGITAL DIAGNOSTIC  BILATERAL MAMMOGRAM WITH CAD AND ADJUNCT TOMO ULTRASOUND LEFT BREAST COMPARISON:  Previous exam(s). ACR Breast Density Category b: There are scattered areas of fibroglandular density. FINDINGS: No suspicious mass, malignant type microcalcifications or distortion detected in either breast. Mammographic images were processed with CAD. On physical exam, I do not palpate a mass in the subareolar regions of the left breast. The left nipple is currently not inverted. Targeted ultrasound is performed, showing normal tissue in the subareolar region of the left breast. No solid or cystic mass, abnormal shadowing or distortion visualized. IMPRESSION: No evidence of malignancy in either breast. RECOMMENDATION: Further evaluation the left nipple inversion with MRI is recommended. I have discussed the findings and recommendations with the patient. Results were also provided in writing at the conclusion of the visit. If applicable, a reminder letter will be sent to the patient regarding the next appointment. BI-RADS CATEGORY  0: Incomplete. Need additional imaging evaluation and/or prior mammograms for comparison. Electronically Signed   By: Lillia Mountain M.D.   On: 09/18/2016 11:36   Mm Diag Breast Tomo Bilateral  Result Date: 09/18/2016 CLINICAL DATA:  82 year old female complaining of intermittent left nipple inversion. EXAM: 2D DIGITAL DIAGNOSTIC BILATERAL MAMMOGRAM WITH CAD AND ADJUNCT TOMO ULTRASOUND LEFT BREAST COMPARISON:  Previous exam(s). ACR Breast Density Category b: There are scattered areas of fibroglandular density. FINDINGS: No suspicious mass, malignant type microcalcifications or distortion detected in either breast. Mammographic images were processed with CAD. On physical exam, I do not palpate a mass in the subareolar regions of the left breast. The left nipple is currently not inverted. Targeted ultrasound is performed, showing normal tissue in the subareolar region of the left breast. No solid or cystic  mass, abnormal shadowing or distortion visualized. IMPRESSION: No evidence of malignancy in either breast. RECOMMENDATION: Further evaluation the left nipple inversion with MRI is recommended. I have discussed the findings and recommendations with the patient. Results were also provided in writing at the conclusion of the visit. If applicable, a reminder letter will be sent to the patient regarding the next appointment. BI-RADS CATEGORY  0: Incomplete. Need additional imaging evaluation and/or prior mammograms for comparison. Electronically Signed   By: Lillia Mountain M.D.   On: 09/18/2016 11:36    Assessment & Plan:   There are no diagnoses linked to this encounter. I have discontinued Ms. Lynde's loratadine. I am also having her maintain her cholecalciferol, LORazepam, and SYNTHROID.  No orders of the defined types were placed in this encounter.    Follow-up: No Follow-up on file.  Walker Kehr, MD

## 2016-10-13 NOTE — Assessment & Plan Note (Signed)
Wt Readings from Last 3 Encounters:  10/13/16 118 lb (53.5 kg)  10/12/15 117 lb (53.1 kg)  03/31/15 116 lb (52.6 kg)

## 2016-10-14 LAB — VITAMIN D 25 HYDROXY (VIT D DEFICIENCY, FRACTURES): Vit D, 25-Hydroxy: 29 ng/mL — ABNORMAL LOW (ref 30–100)

## 2016-10-15 ENCOUNTER — Encounter: Payer: Self-pay | Admitting: Internal Medicine

## 2016-10-16 MED ORDER — VITAMIN D3 1.25 MG (50000 UT) PO CAPS: 1.0000 | ORAL_CAPSULE | ORAL | 0 refills | Status: DC

## 2016-11-08 ENCOUNTER — Other Ambulatory Visit: Payer: Medicare Other

## 2016-12-26 ENCOUNTER — Telehealth: Payer: Self-pay | Admitting: Internal Medicine

## 2016-12-26 NOTE — Telephone Encounter (Signed)
Please advise 

## 2016-12-26 NOTE — Telephone Encounter (Signed)
Pt said that she is having issues with her BP. She has been having the following readings: 148/87 152/93 163/91 178/103 Please advise on what she should do.

## 2016-12-27 MED ORDER — HYDROCHLOROTHIAZIDE 12.5 MG PO CAPS: 12.5000 mg | ORAL_CAPSULE | Freq: Every day | ORAL | 11 refills | Status: DC

## 2016-12-27 NOTE — Telephone Encounter (Signed)
Patient is calling to follow up, please advise.

## 2016-12-27 NOTE — Telephone Encounter (Signed)
Noted Start a low dose HCTZ 9Rx emailed) q am RTC 1 mo Thx

## 2016-12-27 NOTE — Telephone Encounter (Signed)
Pt is aware of rx sent and will call back in 1 mo with her BP reading.

## 2017-04-12 ENCOUNTER — Other Ambulatory Visit: Payer: Self-pay | Admitting: Internal Medicine

## 2017-04-12 NOTE — Telephone Encounter (Signed)
Routing to dr crawford in the absence of dr plotnikov--please advise, thanks

## 2017-04-16 DIAGNOSIS — H524 Presbyopia: Secondary | ICD-10-CM | POA: Diagnosis not present

## 2017-09-26 ENCOUNTER — Encounter: Payer: Self-pay | Admitting: Internal Medicine

## 2017-09-26 ENCOUNTER — Ambulatory Visit: Payer: Medicare Other | Admitting: Internal Medicine

## 2017-09-26 ENCOUNTER — Ambulatory Visit (INDEPENDENT_AMBULATORY_CARE_PROVIDER_SITE_OTHER)
Admission: RE | Admit: 2017-09-26 | Discharge: 2017-09-26 | Disposition: A | Discharge status: Home / Self Care | Payer: Medicare Other | Source: Ambulatory Visit | Attending: Internal Medicine | Admitting: Internal Medicine

## 2017-09-26 VITALS — BP 132/76 | HR 72 | Temp 98.0°F | Ht 63.0 in | Wt 113.0 lb

## 2017-09-26 DIAGNOSIS — E034 Atrophy of thyroid (acquired): Secondary | ICD-10-CM | POA: Diagnosis not present

## 2017-09-26 DIAGNOSIS — M5410 Radiculopathy, site unspecified: Secondary | ICD-10-CM | POA: Diagnosis not present

## 2017-09-26 DIAGNOSIS — M47816 Spondylosis without myelopathy or radiculopathy, lumbar region: Secondary | ICD-10-CM | POA: Diagnosis not present

## 2017-09-26 DIAGNOSIS — E030 Congenital hypothyroidism with diffuse goiter: Secondary | ICD-10-CM

## 2017-09-26 DIAGNOSIS — Z0000 Encounter for general adult medical examination without abnormal findings: Secondary | ICD-10-CM

## 2017-09-26 DIAGNOSIS — R030 Elevated blood-pressure reading, without diagnosis of hypertension: Secondary | ICD-10-CM

## 2017-09-26 MED ORDER — HYDROCHLOROTHIAZIDE 12.5 MG PO CAPS: 12.5000 mg | ORAL_CAPSULE | Freq: Every day | ORAL | 11 refills | Status: DC

## 2017-09-26 MED ORDER — PREDNISONE 10 MG PO TABS
ORAL_TABLET | ORAL | 1 refills | Status: DC
Start: 2017-09-26 — End: 2017-11-13

## 2017-09-26 NOTE — Assessment & Plan Note (Signed)
HCTZ 

## 2017-09-26 NOTE — Assessment & Plan Note (Signed)
Labs

## 2017-09-26 NOTE — Patient Instructions (Signed)
Sciatica Sciatica is pain, numbness, weakness, or tingling along your sciatic nerve. The sciatic nerve starts in the lower back and goes down the back of each leg. Sciatica happens when this nerve is pinched or has pressure put on it. Sciatica usually goes away on its own or with treatment. Sometimes, sciatica may keep coming back (recur). Follow these instructions at home: Medicines  Take over-the-counter and prescription medicines only as told by your doctor.  Do not drive or use heavy machinery while taking prescription pain medicine. Managing pain  If directed, put ice on the affected area. ? Put ice in a plastic bag. ? Place a towel between your skin and the bag. ? Leave the ice on for 20 minutes, 2-3 times a day.  After icing, apply heat to the affected area before you exercise or as often as told by your doctor. Use the heat source that your doctor tells you to use, such as a moist heat pack or a heating pad. ? Place a towel between your skin and the heat source. ? Leave the heat on for 20-30 minutes. ? Remove the heat if your skin turns bright red. This is especially important if you are unable to feel pain, heat, or cold. You may have a greater risk of getting burned. Activity  Return to your normal activities as told by your doctor. Ask your doctor what activities are safe for you. ? Avoid activities that make your sciatica worse.  Take short rests during the day. Rest in a lying or standing position. This is usually better than sitting to rest. ? When you rest for a long time, do some physical activity or stretching between periods of rest. ? Avoid sitting for a long time without moving. Get up and move around at least one time each hour.  Exercise and stretch regularly, as told by your doctor.  Do not lift anything that is heavier than 10 lb (4.5 kg) while you have symptoms of sciatica. ? Avoid lifting heavy things even when you do not have symptoms. ? Avoid lifting heavy  things over and over.  When you lift objects, always lift in a way that is safe for your body. To do this, you should: ? Bend your knees. ? Keep the object close to your body. ? Avoid twisting. General instructions  Use good posture. ? Avoid leaning forward when you are sitting. ? Avoid hunching over when you are standing.  Stay at a healthy weight.  Wear comfortable shoes that support your feet. Avoid wearing high heels.  Avoid sleeping on a mattress that is too soft or too hard. You might have less pain if you sleep on a mattress that is firm enough to support your back.  Keep all follow-up visits as told by your doctor. This is important. Contact a doctor if:  You have pain that: ? Wakes you up when you are sleeping. ? Gets worse when you lie down. ? Is worse than the pain you have had in the past. ? Lasts longer than 4 weeks.  You lose weight for without trying. Get help right away if:  You cannot control when you pee (urinate) or poop (have a bowel movement).  You have weakness in any of these areas and it gets worse. ? Lower back. ? Lower belly (pelvis). ? Butt (buttocks). ? Legs.  You have redness or swelling of your back.  You have a burning feeling when you pee. This information is not intended to replace   advice given to you by your health care provider. Make sure you discuss any questions you have with your health care provider. Document Released: 11/16/2007 Document Revised: 07/15/2015 Document Reviewed: 10/16/2014 Elsevier Interactive Patient Education  2018 [employer]  

## 2017-09-26 NOTE — Assessment & Plan Note (Signed)
Prednisone 10 mg: take 4 tabs a day x 3 days; then 3 tabs a day x 4 days; then 2 tabs a day x 4 days, then 1 tab a day x 6 days, then stop. Take pc. Floating therapy PT X ray RTC 1 mo

## 2017-09-26 NOTE — Progress Notes (Signed)
Subjective:  Patient ID: Lindsay Maldonado, female    DOB: [birth year]  Age: 83 y.o. MRN: [id number]  CC: No chief complaint on file.   HPI ANTINETTE KEOUGH presents for LBP irrad to R leg x 6-7 wks. She has been packing to move. Pain is 5-10/10 Worse w/ROM F/u w/elev BP   Outpatient Medications Prior to Visit  Medication Sig Dispense Refill  . cholecalciferol (VITAMIN D) 1000 units tablet Take 2 tablets (2,000 Units total) by mouth daily. 100 tablet 3  . Cholecalciferol (VITAMIN D3) 50000 units CAPS Take 1 capsule by mouth once a week. 6 capsule 0  . hydrochlorothiazide (MICROZIDE) 12.5 MG capsule Take 1 capsule (12.5 mg total) daily by mouth. 30 capsule 11  . LORazepam (ATIVAN) 1 MG tablet TAKE 1 TABLET BY MOUTH TWICE A DAY AS NEEDED FOR ANXIETY  60 tablet 3  . SYNTHROID 75 MCG tablet Take 1 tablet (75 mcg total) by mouth daily. 90 tablet 3  . ranitidine (ZANTAC) 150 MG tablet Take 1 tablet (150 mg total) by mouth 2 (two) times daily. (Patient not taking: Reported on 09/26/2017) 60 tablet 5   No facility-administered medications prior to visit.     ROS: Review of Systems  Constitutional: Negative for activity change, appetite change, chills, fatigue and unexpected weight change.  HENT: Negative for congestion, mouth sores and sinus pressure.   Eyes: Negative for visual disturbance.  Respiratory: Negative for cough and chest tightness.   Gastrointestinal: Negative for abdominal pain and nausea.  Genitourinary: Negative for difficulty urinating, frequency and vaginal pain.  Musculoskeletal: Positive for back pain and gait problem.  Skin: Negative for pallor and rash.  Neurological: Negative for dizziness, tremors, weakness, numbness and headaches.  Psychiatric/Behavioral: Negative for confusion, sleep disturbance and suicidal ideas.    Objective:  BP 132/76 (BP Location: Left Arm, Patient Position: Sitting, Cuff Size: Normal)   Pulse 72   Temp 98 F (36.7 C) (Oral)   Ht 5'  3" (1.6 m)   Wt 113 lb (51.3 kg)   SpO2 98%   BMI 20.02 kg/m   BP Readings from Last 3 Encounters:  09/26/17 132/76  10/13/16 132/84  10/12/15 130/80    Wt Readings from Last 3 Encounters:  09/26/17 113 lb (51.3 kg)  10/13/16 118 lb (53.5 kg)  10/12/15 117 lb (53.1 kg)    Physical Exam  Constitutional: She appears well-developed. No distress.  HENT:  Head: Normocephalic.  Right Ear: External ear normal.  Left Ear: External ear normal.  Nose: Nose normal.  Mouth/Throat: Oropharynx is clear and moist.  Eyes: Pupils are equal, round, and reactive to light. Conjunctivae are normal. Right eye exhibits no discharge. Left eye exhibits no discharge.  Neck: Normal range of motion. Neck supple. No JVD present. No tracheal deviation present. No thyromegaly present.  Cardiovascular: Normal rate, regular rhythm and normal heart sounds.  Pulmonary/Chest: No stridor. No respiratory distress. She has no wheezes.  Abdominal: Soft. Bowel sounds are normal. She exhibits no distension and no mass. There is no tenderness. There is no rebound and no guarding.  Musculoskeletal: She exhibits tenderness. She exhibits no edema.  Lymphadenopathy:    She has no cervical adenopathy.  Neurological: She displays normal reflexes. No cranial nerve deficit. She exhibits normal muscle tone. Coordination normal.  Skin: No rash noted. No erythema.  Psychiatric: She has a normal mood and affect. Her behavior is normal. Judgment and thought content normal.  str leg elev (+) R LS tender  Lab Results  Component Value Date   WBC 4.3 10/13/2016   HGB 13.9 10/13/2016   HCT 41.9 10/13/2016   PLT 176.0 10/13/2016   GLUCOSE 89 10/13/2016   CHOL 195 10/13/2016   TRIG 65.0 10/13/2016   HDL 86.10 10/13/2016   LDLCALC 95 10/13/2016   ALT 16 10/13/2016   AST 17 10/13/2016   NA 140 10/13/2016   K 3.7 10/13/2016   CL 103 10/13/2016   CREATININE 0.68 10/13/2016   BUN 10 10/13/2016   CO2 31 10/13/2016   TSH 3.07  10/13/2016    US Breast Marvin Axilla  Addendum Date: 12/06/2016   ADDENDUM REPORT: 12/06/2016 14:20 ADDENDUM: Patient states that the left nipple intermittently inverts but seems less frequent than previously. Patient does not want to have an MRI. Short-term interval follow-up left mammogram and ultrasound in 6 months is recommended (January 2019). 3: Probably benign. Electronically Signed   By: Lillia Mountain M.D.   On: 12/06/2016 14:20   Result Date: 12/06/2016 CLINICAL DATA:  83 year old female complaining of intermittent left nipple inversion. EXAM: 2D DIGITAL DIAGNOSTIC BILATERAL MAMMOGRAM WITH CAD AND ADJUNCT TOMO ULTRASOUND LEFT BREAST COMPARISON:  Previous exam(s). ACR Breast Density Category b: There are scattered areas of fibroglandular density. FINDINGS: No suspicious mass, malignant type microcalcifications or distortion detected in either breast. Mammographic images were processed with CAD. On physical exam, I do not palpate a mass in the subareolar regions of the left breast. The left nipple is currently not inverted. Targeted ultrasound is performed, showing normal tissue in the subareolar region of the left breast. No solid or cystic mass, abnormal shadowing or distortion visualized. IMPRESSION: No evidence of malignancy in either breast. RECOMMENDATION: Further evaluation the left nipple inversion with MRI is recommended. I have discussed the findings and recommendations with the patient. Results were also provided in writing at the conclusion of the visit. If applicable, a reminder letter will be sent to the patient regarding the next appointment. BI-RADS CATEGORY  0: Incomplete. Need additional imaging evaluation and/or prior mammograms for comparison. Electronically Signed: By: Lillia Mountain M.D. On: 09/18/2016 11:36   Mm Diag Breast Tomo Bilateral  Addendum Date: 12/06/2016   ADDENDUM REPORT: 12/06/2016 14:20 ADDENDUM: Patient states that the left nipple intermittently  inverts but seems less frequent than previously. Patient does not want to have an MRI. Short-term interval follow-up left mammogram and ultrasound in 6 months is recommended (January 2019). 3: Probably benign. Electronically Signed   By: Lillia Mountain M.D.   On: 12/06/2016 14:20   Result Date: 12/06/2016 CLINICAL DATA:  83 year old female complaining of intermittent left nipple inversion. EXAM: 2D DIGITAL DIAGNOSTIC BILATERAL MAMMOGRAM WITH CAD AND ADJUNCT TOMO ULTRASOUND LEFT BREAST COMPARISON:  Previous exam(s). ACR Breast Density Category b: There are scattered areas of fibroglandular density. FINDINGS: No suspicious mass, malignant type microcalcifications or distortion detected in either breast. Mammographic images were processed with CAD. On physical exam, I do not palpate a mass in the subareolar regions of the left breast. The left nipple is currently not inverted. Targeted ultrasound is performed, showing normal tissue in the subareolar region of the left breast. No solid or cystic mass, abnormal shadowing or distortion visualized. IMPRESSION: No evidence of malignancy in either breast. RECOMMENDATION: Further evaluation the left nipple inversion with MRI is recommended. I have discussed the findings and recommendations with the patient. Results were also provided in writing at the conclusion of the visit. If applicable, a reminder  letter will be sent to the patient regarding the next appointment. BI-RADS CATEGORY  0: Incomplete. Need additional imaging evaluation and/or prior mammograms for comparison. Electronically Signed: By: Lillia Mountain M.D. On: 09/18/2016 11:36    Assessment & Plan:   There are no diagnoses linked to this encounter.   No orders of the defined types were placed in this encounter.    Follow-up: No follow-ups on file.  Walker Kehr, MD

## 2017-09-28 ENCOUNTER — Telehealth: Payer: Self-pay | Admitting: Internal Medicine

## 2017-09-28 NOTE — Telephone Encounter (Signed)
Copied from [place] [id number]. Topic: Referral - Request >> Sep 28, 2017 11:29 AM Bea Graff, NT wrote: Reason for CRM: Lindsay Maldonado with [facility] calling to request a referral for this patient as well as insurance and demographic info. Pt came to set up an appt with them but they do not have the referral. Fax#: [phone] Phone#: [phone]

## 2017-09-29 NOTE — Telephone Encounter (Signed)
Ref was made on 09/26/17 Thx

## 2017-10-01 NOTE — Telephone Encounter (Signed)
09/26/17 PT referral printed and faxed to [facility] with [facility] @ [phone].

## 2017-10-02 DIAGNOSIS — M5431 Sciatica, right side: Secondary | ICD-10-CM | POA: Diagnosis not present

## 2017-10-02 DIAGNOSIS — X500XXD Overexertion from strenuous movement or load, subsequent encounter: Secondary | ICD-10-CM | POA: Diagnosis not present

## 2017-10-02 DIAGNOSIS — M25551 Pain in right hip: Secondary | ICD-10-CM | POA: Diagnosis not present

## 2017-10-02 DIAGNOSIS — M545 Low back pain: Secondary | ICD-10-CM | POA: Diagnosis not present

## 2017-10-02 NOTE — Telephone Encounter (Signed)
[facility] calling to request a referral for this patient as well as insurance and demographic info. Insurance is mising. Please fax to [phone]

## 2017-10-02 NOTE — Telephone Encounter (Signed)
Refaxed insurance info & referral to [place].Marland KitchenJohny Maldonado

## 2017-10-08 ENCOUNTER — Other Ambulatory Visit: Payer: Self-pay | Admitting: Internal Medicine

## 2017-10-08 DIAGNOSIS — G8929 Other chronic pain: Secondary | ICD-10-CM

## 2017-10-08 DIAGNOSIS — M5431 Sciatica, right side: Secondary | ICD-10-CM | POA: Diagnosis not present

## 2017-10-08 DIAGNOSIS — E785 Hyperlipidemia, unspecified: Secondary | ICD-10-CM

## 2017-10-08 DIAGNOSIS — Z0000 Encounter for general adult medical examination without abnormal findings: Secondary | ICD-10-CM

## 2017-10-08 DIAGNOSIS — M545 Low back pain: Secondary | ICD-10-CM

## 2017-10-08 DIAGNOSIS — M25551 Pain in right hip: Secondary | ICD-10-CM | POA: Diagnosis not present

## 2017-10-08 DIAGNOSIS — E034 Atrophy of thyroid (acquired): Secondary | ICD-10-CM

## 2017-10-08 DIAGNOSIS — X500XXD Overexertion from strenuous movement or load, subsequent encounter: Secondary | ICD-10-CM | POA: Diagnosis not present

## 2017-10-11 DIAGNOSIS — M545 Low back pain: Secondary | ICD-10-CM | POA: Diagnosis not present

## 2017-10-11 DIAGNOSIS — X500XXD Overexertion from strenuous movement or load, subsequent encounter: Secondary | ICD-10-CM | POA: Diagnosis not present

## 2017-10-11 DIAGNOSIS — M5431 Sciatica, right side: Secondary | ICD-10-CM | POA: Diagnosis not present

## 2017-10-11 DIAGNOSIS — M25551 Pain in right hip: Secondary | ICD-10-CM | POA: Diagnosis not present

## 2017-10-15 DIAGNOSIS — X500XXD Overexertion from strenuous movement or load, subsequent encounter: Secondary | ICD-10-CM | POA: Diagnosis not present

## 2017-10-15 DIAGNOSIS — M545 Low back pain: Secondary | ICD-10-CM | POA: Diagnosis not present

## 2017-10-15 DIAGNOSIS — M5431 Sciatica, right side: Secondary | ICD-10-CM | POA: Diagnosis not present

## 2017-10-15 DIAGNOSIS — M25551 Pain in right hip: Secondary | ICD-10-CM | POA: Diagnosis not present

## 2017-10-19 DIAGNOSIS — M545 Low back pain: Secondary | ICD-10-CM | POA: Diagnosis not present

## 2017-10-19 DIAGNOSIS — X500XXD Overexertion from strenuous movement or load, subsequent encounter: Secondary | ICD-10-CM | POA: Diagnosis not present

## 2017-10-19 DIAGNOSIS — M5431 Sciatica, right side: Secondary | ICD-10-CM | POA: Diagnosis not present

## 2017-10-19 DIAGNOSIS — M25551 Pain in right hip: Secondary | ICD-10-CM | POA: Diagnosis not present

## 2017-10-24 DIAGNOSIS — M545 Low back pain: Secondary | ICD-10-CM | POA: Diagnosis not present

## 2017-10-24 DIAGNOSIS — M5431 Sciatica, right side: Secondary | ICD-10-CM | POA: Diagnosis not present

## 2017-10-24 DIAGNOSIS — M25551 Pain in right hip: Secondary | ICD-10-CM | POA: Diagnosis not present

## 2017-10-24 DIAGNOSIS — X500XXD Overexertion from strenuous movement or load, subsequent encounter: Secondary | ICD-10-CM | POA: Diagnosis not present

## 2017-10-26 DIAGNOSIS — M25551 Pain in right hip: Secondary | ICD-10-CM | POA: Diagnosis not present

## 2017-10-26 DIAGNOSIS — M5431 Sciatica, right side: Secondary | ICD-10-CM | POA: Diagnosis not present

## 2017-10-26 DIAGNOSIS — X500XXD Overexertion from strenuous movement or load, subsequent encounter: Secondary | ICD-10-CM | POA: Diagnosis not present

## 2017-10-26 DIAGNOSIS — M545 Low back pain: Secondary | ICD-10-CM | POA: Diagnosis not present

## 2017-11-05 DIAGNOSIS — M5431 Sciatica, right side: Secondary | ICD-10-CM | POA: Diagnosis not present

## 2017-11-05 DIAGNOSIS — X500XXD Overexertion from strenuous movement or load, subsequent encounter: Secondary | ICD-10-CM | POA: Diagnosis not present

## 2017-11-05 DIAGNOSIS — M545 Low back pain: Secondary | ICD-10-CM | POA: Diagnosis not present

## 2017-11-05 DIAGNOSIS — M25551 Pain in right hip: Secondary | ICD-10-CM | POA: Diagnosis not present

## 2017-11-12 ENCOUNTER — Telehealth: Payer: Self-pay | Admitting: Internal Medicine

## 2017-11-12 NOTE — Telephone Encounter (Signed)
Copied from [place] [id number]. Topic: Quick Communication - See Telephone Encounter >> Nov 12, 2017 10:27 AM Blase Mess A wrote: CRM for notification. See Telephone encounter for: 11/12/17. Patient said that Dr. Camila Li previously put order in for blood work for CPE but she was not able to go.  She has Cpe scheduled 01/07/18 and would like to have orders placed before the cpe.  She was advised that orders are placed after cpe.  Please advise Patient call back [phone]

## 2017-11-12 NOTE — Telephone Encounter (Signed)
Pt notified, labs are still in from last OV since she did not get them done

## 2017-11-13 ENCOUNTER — Encounter: Payer: Self-pay | Admitting: Internal Medicine

## 2017-11-13 ENCOUNTER — Other Ambulatory Visit (INDEPENDENT_AMBULATORY_CARE_PROVIDER_SITE_OTHER): Payer: Medicare Other

## 2017-11-13 ENCOUNTER — Ambulatory Visit: Payer: Medicare Other | Admitting: Internal Medicine

## 2017-11-13 VITALS — BP 138/80 | HR 89 | Temp 98.3°F | Resp 16 | Ht 63.0 in | Wt 112.8 lb

## 2017-11-13 DIAGNOSIS — J3489 Other specified disorders of nose and nasal sinuses: Secondary | ICD-10-CM | POA: Diagnosis not present

## 2017-11-13 DIAGNOSIS — H9193 Unspecified hearing loss, bilateral: Secondary | ICD-10-CM | POA: Insufficient documentation

## 2017-11-13 DIAGNOSIS — R519 Headache, unspecified: Secondary | ICD-10-CM

## 2017-11-13 DIAGNOSIS — R51 Headache: Secondary | ICD-10-CM

## 2017-11-13 LAB — SEDIMENTATION RATE: Sed Rate: 18 mm/hr (ref 0–30)

## 2017-11-13 LAB — C-REACTIVE PROTEIN: CRP: <0.1 mg/dL — ABNORMAL LOW (ref 0.5–20.0)

## 2017-11-13 MED ORDER — FLUTICASONE PROPIONATE 50 MCG/ACT NA SUSP: 2.0000 | Freq: Every day | NASAL | 6 refills | Status: DC

## 2017-11-13 MED ORDER — PREDNISONE 20 MG PO TABS: 40.0000 mg | ORAL_TABLET | Freq: Every day | ORAL | 0 refills | Status: DC

## 2017-11-13 NOTE — Assessment & Plan Note (Signed)
Sinus pressure and ethmoidal region No obvious sinus infection Has some eustachian tube dysfunction We will treat both with short course of steroids and Flonase Would consider antibiotic if no improvement

## 2017-11-13 NOTE — Assessment & Plan Note (Signed)
Having headaches on the sides of her head and temple region Atypical for temporal arteritis, but given the region that she is having headaches will check ESR, CRP May be sinus related and may improve with short course of prednisone, if not will need further evaluation

## 2017-11-13 NOTE — Assessment & Plan Note (Signed)
She has been experiencing some hearing loss for the past few weeks that has gotten worse recently, associated with sinus pressure and feeling like her ears are in a barrel.  Also having some headaches No evidence of infection, no cerumen impaction Likely eustachian tube dysfunction Start Flonase daily Prednisone 40 mg daily for 5 days If no improvement may need to see ENT

## 2017-11-13 NOTE — Patient Instructions (Signed)
  Tests ordered today. Your results will be released to [facility] (or called to you) after review, usually within 72hours after test completion. If any changes need to be made, you will be notified at that same time.   Medications reviewed and updated.  Changes include :   Prednisone and flonase nasal spray.   Your prescription(s) have been submitted to your pharmacy. Please take as directed and contact our office if you believe you are having problem(s) with the medication(s).

## 2017-11-13 NOTE — Progress Notes (Signed)
Subjective:    Patient ID: Lindsay Maldonado, female    DOB: 1934/05/30, 83 y.o.   MRN: [id number]  HPI She is here for an acute visit for cold symptoms.  Her symptoms started several weeks ago.  She was on prednisone for 2 weeks for sciatica.  The sciatica is better.  Shortly after completing the prednisone she started to experience headaches.  The headaches are on the each side of her head.  Taking Advil does relieve the headache, but it eventually comes back.  She has had some headaches in her temporal region as well.  She is also been experiencing sinus pressure on each side of the nose.  She states her ears feel like she is in a barrel and her hearing has not been as good.  Besides that she feels well.  She has not had any fevers, chills, nasal congestion or ear pain.  She denies a sore throat, cough above which she typically has, shortness of breath and wheezing.  She denies any changes in vision or nausea.  She did take Afrin for a few days and that seemed to help the ears and sinus pressure.  She takes Advil for the headaches as needed.  She typically does not get sinus infections, sinus issues or allergies.  Medications and allergies reviewed with patient and updated if appropriate.  Patient Active Problem List   Diagnosis Date Noted  . Radiculitis of leg 09/26/2017  . Grief 10/13/2016  . Right LBP 09/29/2014  . Actinic keratoses 09/05/2012  . Chest wall pain 09/05/2012  . Elevated BP without diagnosis of hypertension 09/04/2011  . Elbow pain, left 05/12/2010  . Well adult exam 05/12/2010  . WEIGHT LOSS 04/26/2009  . Hypothyroidism 03/12/2007  . Cough 03/12/2007  . CERVICAL STRAIN 03/12/2007  . GOITER, MULTINODULAR 11/29/2006  . Fibromyalgia 11/29/2006  . OSTEOPENIA 11/29/2006    Current Outpatient Medications on File Prior to Visit  Medication Sig Dispense Refill  . cholecalciferol (VITAMIN D) 1000 units tablet Take 2 tablets (2,000 Units total) by mouth daily. 100  tablet 3  . Cholecalciferol (VITAMIN D3) 50000 units CAPS Take 1 capsule by mouth once a week. 6 capsule 0  . hydrochlorothiazide (MICROZIDE) 12.5 MG capsule Take 1 capsule (12.5 mg total) by mouth daily. 30 capsule 11  . LORazepam (ATIVAN) 1 MG tablet TAKE 1 TABLET BY MOUTH TWICE A DAY AS NEEDED FOR ANXIETY  60 tablet 3  . SYNTHROID 75 MCG tablet TAKE 1 TABLET BY MOUTH ONCE A DAY  90 tablet 1   No current facility-administered medications on file prior to visit.     Past Medical History:  Diagnosis Date  . Cyst of breast   . Fibromyalgia   . Goiter    multinodular  . Hypothyroidism   . Osteopenia     Past Surgical History:  Procedure Laterality Date  . BREAST EXCISIONAL BIOPSY Left    benign  . BREAST EXCISIONAL BIOPSY Right    benign  . ENDOMETRIAL BIOPSY  2008   Dr. Deatra Ina  . ROTATOR CUFF REPAIR     Left  . THYROID SURGERY  2007   Nodule removed    Social History   Socioeconomic History  . Marital status: Widowed    Spouse name: Not on file  . Number of children: 5  . Years of education: Not on file  . Highest education level: Not on file  Occupational History  . Occupation: housewife  Social Needs  . Financial  resource strain: Not on file  . Food insecurity:    Worry: Not on file    Inability: Not on file  . Transportation needs:    Medical: Not on file    Non-medical: Not on file  Tobacco Use  . Smoking status: Never Smoker  . Smokeless tobacco: Never Used  Substance and Sexual Activity  . Alcohol use: No  . Drug use: Not on file  . Sexual activity: Not on file  Lifestyle  . Physical activity:    Days per week: Not on file    Minutes per session: Not on file  . Stress: Not on file  Relationships  . Social connections:    Talks on phone: Not on file    Gets together: Not on file    Attends religious service: Not on file    Active member of club or organization: Not on file    Attends meetings of clubs or organizations: Not on file     Relationship status: Not on file  Other Topics Concern  . Not on file  Social History Narrative   Physician Roster:   Dr. Deatra Ina   Dr. Watt Climes    Family History  Problem Relation Age of Onset  . Cancer Mother 64       breast  . Parkinsonism Father     Review of Systems  Constitutional: Negative for chills and fever.  HENT: Positive for hearing loss and sinus pressure (b/l side of nose). Negative for congestion, ear pain (feel funny, in a barrel), sinus pain and sore throat.   Respiratory: Positive for cough (occ, chronic). Negative for shortness of breath and wheezing.   Musculoskeletal: Positive for neck pain.  Neurological: Positive for headaches.       Objective:   Vitals:   11/13/17 1544  BP: 138/80  Pulse: 89  Resp: 16  Temp: 98.3 F (36.8 C)  SpO2: 97%   Filed Weights   11/13/17 1544  Weight: 112 lb 12.8 oz (51.2 kg)   Body mass index is 19.98 kg/m.  Wt Readings from Last 3 Encounters:  11/13/17 112 lb 12.8 oz (51.2 kg)  09/26/17 113 lb (51.3 kg)  10/13/16 118 lb (53.5 kg)     Physical Exam GENERAL APPEARANCE: Appears stated age, well appearing, NAD EYES: conjunctiva clear, no icterus HEENT: No temple tenderness with palpation-no temporal artery tenderness, bilateral tympanic membranes and ear canals normal, oropharynx with no erythema, no TMJ tenderness, no thyromegaly, trachea midline, no cervical or supraclavicular lymphadenopathy LUNGS: Clear to auscultation without wheeze or crackles, unlabored breathing, good air entry bilaterally CARDIOVASCULAR: Normal S1,S2 without murmurs, no edema SKIN: warm, dry      Assessment & Plan:   See Problem List for Assessment and Plan of chronic medical problems.

## 2017-11-28 ENCOUNTER — Telehealth: Payer: Self-pay | Admitting: Internal Medicine

## 2017-11-28 DIAGNOSIS — H9190 Unspecified hearing loss, unspecified ear: Secondary | ICD-10-CM

## 2017-11-28 NOTE — Telephone Encounter (Signed)
Copied from [place] [id number]. Topic: General - Other >> Nov 28, 2017  2:17 PM Keene Breath wrote: Reason for CRM: Patient called to request a referral for an ENT from Dr. Alain Marion.  Patient stated that she had blood work done recently and wanted to know if the results would be available to the ENT when she gets an appointment.  Please advise.  CB#[phone]

## 2017-11-29 NOTE — Telephone Encounter (Signed)
Please advise about referral. Pt seen Dr. Quay Burow about some hearing loss.

## 2017-11-30 NOTE — Telephone Encounter (Signed)
Ref made I asked to fax labs Thx

## 2017-12-03 ENCOUNTER — Other Ambulatory Visit: Payer: Self-pay | Admitting: Internal Medicine

## 2017-12-03 NOTE — Telephone Encounter (Signed)
Routing to dr john, please advise in the absence of dr plotnikov, thanks 

## 2017-12-03 NOTE — Telephone Encounter (Signed)
Done erx 

## 2017-12-07 DIAGNOSIS — J019 Acute sinusitis, unspecified: Secondary | ICD-10-CM | POA: Diagnosis not present

## 2017-12-26 ENCOUNTER — Other Ambulatory Visit (INDEPENDENT_AMBULATORY_CARE_PROVIDER_SITE_OTHER): Payer: Medicare Other

## 2017-12-26 DIAGNOSIS — R030 Elevated blood-pressure reading, without diagnosis of hypertension: Secondary | ICD-10-CM

## 2017-12-26 DIAGNOSIS — Z0000 Encounter for general adult medical examination without abnormal findings: Secondary | ICD-10-CM

## 2017-12-26 DIAGNOSIS — E034 Atrophy of thyroid (acquired): Secondary | ICD-10-CM

## 2017-12-26 LAB — HEPATIC FUNCTION PANEL
ALBUMIN: 4.1 g/dL (ref 3.5–5.2)
ALK PHOS: 56 U/L (ref 39–117)
ALT: 17 U/L (ref 0–35)
AST: 15 U/L (ref 0–37)
Bilirubin, Direct: 0.1 mg/dL (ref 0.0–0.3)
TOTAL PROTEIN: 6.5 g/dL (ref 6.0–8.3)
Total Bilirubin: 0.6 mg/dL (ref 0.2–1.2)

## 2017-12-26 LAB — URINALYSIS
Bilirubin Urine: NEGATIVE
HGB URINE DIPSTICK: NEGATIVE
Ketones, ur: NEGATIVE
Leukocytes, UA: NEGATIVE
NITRITE: NEGATIVE
Specific Gravity, Urine: 1.015 (ref 1.000–1.030)
Total Protein, Urine: NEGATIVE
Urine Glucose: NEGATIVE
Urobilinogen, UA: 0.2 (ref 0.0–1.0)
pH: 7.5 (ref 5.0–8.0)

## 2017-12-26 LAB — BASIC METABOLIC PANEL
BUN: 10 mg/dL (ref 6–23)
CALCIUM: 9.5 mg/dL (ref 8.4–10.5)
CHLORIDE: 105 meq/L (ref 96–112)
CO2: 31 meq/L (ref 19–32)
CREATININE: 0.7 mg/dL (ref 0.40–1.20)
GFR: 85.22 mL/min (ref >60.00)
Glucose, Bld: 94 mg/dL (ref 70–99)
Potassium: 4.1 mEq/L (ref 3.5–5.1)
SODIUM: 142 meq/L (ref 135–145)

## 2017-12-26 LAB — LIPID PANEL
CHOL/HDL RATIO: 2
CHOLESTEROL: 168 mg/dL (ref 0–200)
HDL: 90.4 mg/dL (ref >39.00)
LDL CALC: 68 mg/dL (ref 0–99)
NonHDL: 77.54
TRIGLYCERIDES: 50 mg/dL (ref 0.0–149.0)
VLDL: 10 mg/dL (ref 0.0–40.0)

## 2017-12-26 LAB — TSH: TSH: 3.98 u[IU]/mL (ref 0.35–4.50)

## 2017-12-27 ENCOUNTER — Encounter: Payer: Self-pay | Admitting: Family

## 2017-12-27 ENCOUNTER — Encounter: Payer: Self-pay | Admitting: *Deleted

## 2017-12-27 ENCOUNTER — Ambulatory Visit: Payer: Medicare Other | Admitting: Family

## 2017-12-27 ENCOUNTER — Ambulatory Visit: Payer: Self-pay | Admitting: *Deleted

## 2017-12-27 ENCOUNTER — Ambulatory Visit: Payer: Medicare Other | Admitting: *Deleted

## 2017-12-27 VITALS — BP 148/78 | HR 64 | Temp 97.7°F | Ht 63.0 in | Wt 114.0 lb

## 2017-12-27 VITALS — BP 141/87

## 2017-12-27 DIAGNOSIS — I10 Essential (primary) hypertension: Secondary | ICD-10-CM

## 2017-12-27 MED ORDER — HYDROCHLOROTHIAZIDE 12.5 MG PO CAPS: 12.5000 mg | ORAL_CAPSULE | Freq: Every day | ORAL | 3 refills | Status: DC

## 2017-12-27 NOTE — Telephone Encounter (Signed)
Patient is calling with concerns about her BP- patient reports that although she is on BP medication- she feels that it has not been controlling her BP and her BP has been creeping up. Patient is going out of town and she would like to be seen before she leaves. She does not have any symptoms of elevation- but her reading are very high- patient reports she has been out of medication for several days. Appointment made for evaluation of her BP and discussion of medication management. She is to go to ED if she should develop any of discussed symptoms before her appointment.  Reason for Disposition . [1] Systolic BP  >= 740 OR Diastolic >= 814  AND [4] having NO cardiac or neurologic symptoms  Answer Assessment - Initial Assessment Questions 1. BLOOD PRESSURE: "What is the blood pressure?" "Did you take at least two measurements 5 minutes apart?"     consistent elevation in BP- 169/101  , 180/93 P 75 2. ONSET: "When did you take your blood pressure?"     7:30 3. HOW: "How did you obtain the blood pressure?" (e.g., visiting nurse, automatic home BP monitor)     Automatic cuff- upper arm 4. HISTORY: "Do you have a history of high blood pressure?"     History of high BP 5. MEDICATIONS: "Are you taking any medications for blood pressure?" "Have you missed any doses recently?"     Yes- missed 3-4 days of BP medication, BP was still elevated 150/87 6. OTHER SYMPTOMS: "Do you have any symptoms?" (e.g., headache, chest pain, blurred vision, difficulty breathing, weakness)     6 weeks ago- patient noticed hearing and sinus symptoms- she was seen at office and given prednisone. She improved. Within 1 week- she has pain in her head in 1 area. She has seen eye doctor for cataracts. 7. PREGNANCY: "Is there any chance you are pregnant?" "When was your last menstrual period?"     n/a  Protocols used: HIGH BLOOD PRESSURE-A-AH

## 2017-12-27 NOTE — Progress Notes (Signed)
Lindsay Maldonado is a 83 y.o. female with the following history as recorded in [employer]:  Patient Active Problem List   Diagnosis Date Noted  . Nonintractable episodic headache 11/13/2017  . Bilateral hearing loss 11/13/2017  . Sinus pressure 11/13/2017  . Radiculitis of leg 09/26/2017  . Grief 10/13/2016  . Right LBP 09/29/2014  . Actinic keratoses 09/05/2012  . Chest wall pain 09/05/2012  . Elevated BP without diagnosis of hypertension 09/04/2011  . Elbow pain, left 05/12/2010  . Well adult exam 05/12/2010  . WEIGHT LOSS 04/26/2009  . Hypothyroidism 03/12/2007  . Cough 03/12/2007  . CERVICAL STRAIN 03/12/2007  . GOITER, MULTINODULAR 11/29/2006  . Fibromyalgia 11/29/2006  . OSTEOPENIA 11/29/2006    Current Outpatient Medications  Medication Sig Dispense Refill  . cholecalciferol (VITAMIN D) 1000 units tablet Take 2 tablets (2,000 Units total) by mouth daily. 100 tablet 3  . Cholecalciferol (VITAMIN D3) 50000 units CAPS Take 1 capsule by mouth once a week. 6 capsule 0  . fluticasone (FLONASE) 50 MCG/ACT nasal spray Place 2 sprays into both nostrils daily. 16 g 6  . hydrochlorothiazide (MICROZIDE) 12.5 MG capsule Take 1 capsule (12.5 mg total) by mouth daily. 30 capsule 3  . LORazepam (ATIVAN) 1 MG tablet TAKE 1 TABLET BY MOUTH TWICE A DAY AS NEEDED FOR ANXIETY  60 tablet 0  . SYNTHROID 75 MCG tablet TAKE 1 TABLET BY MOUTH ONCE A DAY  90 tablet 1   No current facility-administered medications for this visit.     Allergies: Morphine  Past Medical History:  Diagnosis Date  . Cyst of breast   . Fibromyalgia   . Goiter    multinodular  . Hypothyroidism   . Osteopenia     Past Surgical History:  Procedure Laterality Date  . BREAST EXCISIONAL BIOPSY Left    benign  . BREAST EXCISIONAL BIOPSY Right    benign  . ENDOMETRIAL BIOPSY  2008   Dr. Deatra Ina  . ROTATOR CUFF REPAIR     Left  . THYROID SURGERY  2007   Nodule removed    Family History  Problem Relation Age of  Onset  . Cancer Mother 5       breast  . Parkinsonism Father     Social History   Tobacco Use  . Smoking status: Never Smoker  . Smokeless tobacco: Never Used  Substance Use Topics  . Alcohol use: No    Subjective:  Patient presents with concerns for her blood pressure being elevated recently; admits she has been off her medication x 2 weeks; is not on her medication today but was concerned that her blood pressure was quite elevated today- saw a reading of 160/ 100 this morning; admits that stress level has been high recently; Denies any chest pain, shortness of breath, blurred vision or headache. Has not had her blood pressure cuff compared to our cuff here.    Objective:  Vitals:   12/27/17 1037  BP: (!) 148/78  Pulse: 64  Temp: 97.7 F (36.5 C)  TempSrc: Oral  SpO2: 99%  Weight: 114 lb 0.6 oz (51.7 kg)  Height: 5\' 3"  (1.6 m)    General: Well developed, well nourished, in no acute distress  Skin : Warm and dry.  Head: Normocephalic and atraumatic  Lungs: Respirations unlabored; clear to auscultation bilaterally without wheeze, rales, rhonchi  CVS exam: normal rate and regular rhythm.  Neurologic: Alert and oriented; speech intact; face symmetrical; moves all extremities well; CNII-XII intact without focal  deficit   Assessment:  1. Essential hypertension     Plan:  Patient is not on medication x 2 weeks; ? If her cuff is giving inaccurate readings; she will return later today with her cuff for check; encouraged to re-start HCTZ 12. 5 mg daily; adjustment to regimen will be made as needed.   Return for Nurse visit today to check BP cuff.  No orders of the defined types were placed in this encounter.   Requested Prescriptions   Signed Prescriptions Disp Refills  . hydrochlorothiazide (MICROZIDE) 12.5 MG capsule 30 capsule 3    Sig: Take 1 capsule (12.5 mg total) by mouth daily.

## 2017-12-27 NOTE — Progress Notes (Signed)
Pt bought a new BP monitor. Will continue using new one since its close to the reading that I took manually. Pt will be back in 2-3 weeks for her annual w/Dr. Plotnikov.

## 2018-01-07 ENCOUNTER — Encounter: Payer: Self-pay | Admitting: Internal Medicine

## 2018-01-07 ENCOUNTER — Ambulatory Visit (INDEPENDENT_AMBULATORY_CARE_PROVIDER_SITE_OTHER): Payer: Medicare Other | Admitting: Internal Medicine

## 2018-01-07 VITALS — BP 134/72 | HR 78 | Temp 98.3°F | Ht 63.0 in | Wt 114.0 lb

## 2018-01-07 DIAGNOSIS — E034 Atrophy of thyroid (acquired): Secondary | ICD-10-CM

## 2018-01-07 DIAGNOSIS — M545 Low back pain, unspecified: Secondary | ICD-10-CM

## 2018-01-07 DIAGNOSIS — E785 Hyperlipidemia, unspecified: Secondary | ICD-10-CM

## 2018-01-07 DIAGNOSIS — R519 Headache, unspecified: Secondary | ICD-10-CM

## 2018-01-07 DIAGNOSIS — R51 Headache: Secondary | ICD-10-CM

## 2018-01-07 DIAGNOSIS — G8929 Other chronic pain: Secondary | ICD-10-CM

## 2018-01-07 DIAGNOSIS — Z0000 Encounter for general adult medical examination without abnormal findings: Secondary | ICD-10-CM | POA: Diagnosis not present

## 2018-01-07 DIAGNOSIS — G44219 Episodic tension-type headache, not intractable: Secondary | ICD-10-CM | POA: Diagnosis not present

## 2018-01-07 MED ORDER — TRIAMTERENE-HCTZ 37.5-25 MG PO TABS: 1.0000 | ORAL_TABLET | Freq: Every day | ORAL | 3 refills | Status: DC

## 2018-01-07 MED ORDER — SYNTHROID 75 MCG PO TABS: 75.0000 ug | ORAL_TABLET | Freq: Every day | ORAL | 3 refills | Status: DC

## 2018-01-07 MED ORDER — LORAZEPAM 1 MG PO TABS: 1.0000 mg | ORAL_TABLET | Freq: Two times a day (BID) | ORAL | 5 refills | Status: DC | PRN

## 2018-01-07 NOTE — Progress Notes (Signed)
Subjective:  Patient ID: Lindsay Maldonado, female    DOB: [birth year]  Age: 83 y.o. MRN: [id number]  CC: No chief complaint on file.   HPI LOETA HERST presents for a well exam F/u HAs. Saw Dr Quay Burow and ENT - finished abx and Prednisone. HAs are better HA is caused by coughing lately. N/o c/o cough - R side head Pain in the neck R>>L  Outpatient Medications Prior to Visit  Medication Sig Dispense Refill  . Cholecalciferol (VITAMIN D3) 50000 units CAPS Take 1 capsule by mouth once a week. 6 capsule 0  . Coenzyme Q10 (CO Q-10 PO) Take by mouth.    [name] GARLIC PO Take by mouth.    . hydrochlorothiazide (MICROZIDE) 12.5 MG capsule Take 1 capsule (12.5 mg total) by mouth daily. 30 capsule 3  . LORazepam (ATIVAN) 1 MG tablet TAKE 1 TABLET BY MOUTH TWICE A DAY AS NEEDED FOR ANXIETY  60 tablet 0  . SYNTHROID 75 MCG tablet TAKE 1 TABLET BY MOUTH ONCE A DAY  90 tablet 1  . TURMERIC PO Take by mouth.    . cholecalciferol (VITAMIN D) 1000 units tablet Take 2 tablets (2,000 Units total) by mouth daily. 100 tablet 3  . fluticasone (FLONASE) 50 MCG/ACT nasal spray Place 2 sprays into both nostrils daily. 16 g 6   No facility-administered medications prior to visit.     ROS: Review of Systems  Constitutional: Negative for activity change, appetite change, chills, fatigue and unexpected weight change.  HENT: Negative for congestion, mouth sores and sinus pressure.   Eyes: Negative for visual disturbance.  Respiratory: Negative for cough and chest tightness.   Gastrointestinal: Negative for abdominal pain and nausea.  Genitourinary: Negative for difficulty urinating, frequency and vaginal pain.  Musculoskeletal: Positive for neck pain and neck stiffness. Negative for back pain and gait problem.  Skin: Negative for pallor and rash.  Neurological: Positive for headaches. Negative for dizziness, tremors, weakness and numbness.  Psychiatric/Behavioral: Negative for confusion, sleep disturbance and  suicidal ideas.    Objective:  BP 134/72   Pulse 78   Temp 98.3 F (36.8 C) (Oral)   Ht 5\' 3"  (1.6 m)   Wt 114 lb (51.7 kg)   SpO2 97%   BMI 20.19 kg/m   BP Readings from Last 3 Encounters:  01/07/18 134/72  12/27/17 (!) 141/87  12/27/17 (!) 148/78    Wt Readings from Last 3 Encounters:  01/07/18 114 lb (51.7 kg)  12/27/17 114 lb 0.6 oz (51.7 kg)  11/13/17 112 lb 12.8 oz (51.2 kg)    Physical Exam  Constitutional: She appears well-developed. No distress.  HENT:  Head: Normocephalic.  Right Ear: External ear normal.  Left Ear: External ear normal.  Nose: Nose normal.  Mouth/Throat: Oropharynx is clear and moist.  Eyes: Pupils are equal, round, and reactive to light. Conjunctivae are normal. Right eye exhibits no discharge. Left eye exhibits no discharge.  Neck: Normal range of motion. Neck supple. No JVD present. No tracheal deviation present. No thyromegaly present.  Cardiovascular: Normal rate, regular rhythm and normal heart sounds.  Pulmonary/Chest: No stridor. No respiratory distress. She has no wheezes.  Abdominal: Soft. Bowel sounds are normal. She exhibits no distension and no mass. There is no tenderness. There is no rebound and no guarding.  Musculoskeletal: She exhibits tenderness. She exhibits no edema.  Lymphadenopathy:    She has no cervical adenopathy.  Neurological: She displays normal reflexes. No cranial nerve deficit. She exhibits normal  muscle tone. Coordination normal.  Skin: No rash noted. No erythema.  Psychiatric: She has a normal mood and affect. Her behavior is normal. Judgment and thought content normal.  occip base is tender  Lab Results  Component Value Date   WBC 4.3 10/13/2016   HGB 13.9 10/13/2016   HCT 41.9 10/13/2016   PLT 176.0 10/13/2016   GLUCOSE 94 12/26/2017   CHOL 168 12/26/2017   TRIG 50.0 12/26/2017   HDL 90.40 12/26/2017   LDLCALC 68 12/26/2017   ALT 17 12/26/2017   AST 15 12/26/2017   NA 142 12/26/2017   K 4.1  12/26/2017   CL 105 12/26/2017   CREATININE 0.70 12/26/2017   BUN 10 12/26/2017   CO2 31 12/26/2017   TSH 3.98 12/26/2017    Dg Lumbar Spine 2-3 Views  Result Date: 09/26/2017 CLINICAL DATA:  RIGHT leg pain for 7 weeks.  No known injury. EXAM: LUMBAR SPINE - 2-3 VIEW COMPARISON:  None. FINDINGS: There is no evidence of lumbar spine fracture. Alignment is normal. Advanced disc space narrowing L4-5 with osseous spurring. Mild disc space narrowing L3-4 and L5-S1. Vascular calcification. No worrisome osseous lesion. Moderate stool burden. IMPRESSION: Lumbar spondylosis as described, most pronounced at L4-5. If further investigation desired, consider cross-sectional imaging. Electronically Signed   By: Staci Righter M.D.   On: 09/26/2017 15:50    Assessment & Plan:   There are no diagnoses linked to this encounter.   No orders of the defined types were placed in this encounter.    Follow-up: No follow-ups on file.  Walker Kehr, MD

## 2018-01-07 NOTE — Patient Instructions (Addendum)
Cardiac CT calcium scoring test $150   Computed tomography, more commonly known as a CT or CAT scan, is a diagnostic medical imaging test. Like traditional x-rays, it produces multiple images or pictures of the inside of the body. The cross-sectional images generated during a CT scan can be reformatted in multiple planes. They can even generate three-dimensional images. These images can be viewed on a computer monitor, printed on film or by a 3D printer, or transferred to a CD or DVD. CT images of internal organs, bones, soft tissue and blood vessels provide greater detail than traditional x-rays, particularly of soft tissues and blood vessels. A cardiac CT scan for coronary calcium is a non-invasive way of obtaining information about the presence, location and extent of calcified plaque in the coronary arteries-the vessels that supply oxygen-containing blood to the heart muscle. Calcified plaque results when there is a build-up of fat and other substances under the inner layer of the artery. This material can calcify which signals the presence of atherosclerosis, a disease of the vessel wall, also called coronary artery disease (CAD). People with this disease have an increased risk for heart attacks. In addition, over time, progression of plaque build up (CAD) can narrow the arteries or even close off blood flow to the heart. The result may be chest pain, sometimes called "angina," or a heart attack. Because calcium is a marker of CAD, the amount of calcium detected on a cardiac CT scan is a helpful prognostic tool. The findings on cardiac CT are expressed as a calcium score. Another name for this test is coronary artery calcium scoring.  What are some common uses of the procedure? The goal of cardiac CT scan for calcium scoring is to determine if CAD is present and to what extent, even if there are no symptoms. It is a screening study that may be recommended by a physician for patients with risk factors  for CAD but no clinical symptoms. The major risk factors for CAD are: . high blood cholesterol levels  . family history of heart attacks  . diabetes  . high blood pressure  . cigarette smoking  . overweight or obese  . physical inactivity   A negative cardiac CT scan for calcium scoring shows no calcification within the coronary arteries. This suggests that CAD is absent or so minimal it cannot be seen by this technique. The chance of having a heart attack over the next two to five years is very low under these circumstances. A positive test means that CAD is present, regardless of whether or not the patient is experiencing any symptoms. The amount of calcification-expressed as the calcium score-may help to predict the likelihood of a myocardial infarction (heart attack) in the coming years and helps your medical doctor or cardiologist decide whether the patient may need to take preventive medicine or undertake other measures such as diet and exercise to lower the risk for heart attack. The extent of CAD is graded according to your calcium score:  Calcium Score  Presence of CAD  0 No evidence of CAD   1-10 Minimal evidence of CAD  11-100 Mild evidence of CAD  101-400 Moderate evidence of CAD  Over 400 Extensive evidence of CAD       Occipital Neuralgia Occipital neuralgia is a type of headache that causes episodes of very bad pain in the back of your head. Pain from occipital neuralgia may spread (radiate) to other parts of your head. The pain is usually brief and  often goes away after you rest and relax. These headaches may be caused by irritation of the nerves that leave your spinal cord high up in your neck, just below the base of your skull (occipital nerves). Your occipital nerves transmit sensations from the back of your head, the top of your head, and the areas behind your ears. What are the causes? Occipital neuralgia can occur without any known cause (primary headache  syndrome). In other cases, occipital neuralgia is caused by pressure on or irritation of one of the two occipital nerves. Causes of occipital nerve compression or irritation include:  Wear and tear of the vertebrae in the neck (osteoarthritis).  Neck injury.  Disease of the disks that separate the vertebrae.  Tumors.  Gout.  Infections.  Diabetes.  Swollen blood vessels that put pressure on the occipital nerves.  Muscle spasm in the neck.  What are the signs or symptoms? Pain is the main symptom of occipital neuralgia. It usually starts in the back of the head but may also be felt in other areas supplied by the occipital nerves. Pain is usually on one side but may be on both sides. You may have:  Brief episodes of very bad pain that is burning, stabbing, shocking, or shooting.  Pain behind the eye.  Pain triggered by neck movement or hair brushing.  Scalp tenderness.  Aching in the back of the head between episodes of very bad pain.  How is this diagnosed? Your health care provider may diagnose occipital neuralgia based on your symptoms and a physical exam. During the exam, the health care provider may push on areas supplied by the occipital nerves to see if they are painful. Some tests may also be done to help in making the diagnosis. These may include:  Imaging studies of the upper spinal cord, such as an MRI or CT scan. These may show compression or spinal cord abnormalities.  Nerve block. You will get an injection of numbing medicine (local anesthetic) near the occipital nerve to see if this relieves pain.  How is this treated? Treatment may begin with simple measures, such as:  Rest.  Massage.  Heat.  Over-the-counter pain relievers.  If these measures do not work, you may need other treatments, including:  Medicines such as: ? Prescription-strength anti-inflammatory medicines. ? Muscle relaxants. ? Antiseizure medicines. ? Antidepressants.  Steroid  injection. This involves injections of local anesthetic and strong anti-inflammatory drugs (steroids).  Pulsed radiofrequency. Wires are implanted to deliver electrical impulses that block pain signals from the occipital nerve.  Physical therapy.  Surgery to relieve nerve pressure.  Follow these instructions at home:  Take all medicines as directed by your health care provider.  Avoid activities that cause pain.  Rest when you have an attack of pain.  Try gentle massage or a heating pad to relieve pain.  Work with a physical therapist to learn stretching exercises you can do at home.  Try a different pillow or sleeping position.  Practice good posture.  Try to stay active. Get regular exercise that does not cause pain. Ask your health care provider to suggest safe exercises for you.  Keep all follow-up visits as directed by your health care provider. This is important. Contact a health care provider if:  Your medicine is not working.  You have new or worsening symptoms. Get help right away if:  You have very bad head pain that is not going away.  You have a sudden change in vision, balance, or  speech. This information is not intended to replace advice given to you by your health care provider. Make sure you discuss any questions you have with your health care provider. Document Released: 01/31/2001 Document Revised: 07/15/2015 Document Reviewed: 01/29/2013 Elsevier Interactive Patient Education  2017 [employer].

## 2018-01-07 NOTE — Assessment & Plan Note (Signed)
?  R occip neuralgia   Better post steroids, abx S/p ENT eval

## 2018-01-07 NOTE — Assessment & Plan Note (Addendum)
Here for medicare wellness/physical  Diet: heart healthy  Physical activity: not sedentary  Depression/mood screen: negative per pt; grieving. Lindsay Maldonado died in Apr 10, 2014 Hearing: intact to whispered voice  Visual acuity: grossly normal, performs annual eye exam  ADLs: capable  Fall risk: none  Home safety: good  Cognitive evaluation: intact to orientation, naming, recall and repetition  EOL planning: adv directives, full code/ I agree  I have personally reviewed and have noted  1. The patient's medical and social history  2. Their use of alcohol, tobacco or illicit drugs  3. Their current medications and supplements  4. The patient's functional ability including ADL's, fall risks, home safety risks and hearing or visual impairment.  5. Diet and physical activities  6. Evidence for depression or mood disorders 7. Providers roster reviewed    Today patient counseled on age appropriate routine health concerns for screening and prevention, each reviewed and up to date or declined. Immunizations reviewed and up to date or declined. Labs ordered and reviewed. Risk factors for depression reviewed and negative. Hearing function and visual acuity are intact. ADLs screened and addressed as needed. Functional ability and level of safety reviewed and appropriate. Education, counseling and referrals performed based on assessed risks today. Patient provided with a copy of personalized plan for preventive services.    CT ca scoring info

## 2018-03-12 DIAGNOSIS — H25013 Cortical age-related cataract, bilateral: Secondary | ICD-10-CM | POA: Diagnosis not present

## 2018-03-12 DIAGNOSIS — H18413 Arcus senilis, bilateral: Secondary | ICD-10-CM | POA: Diagnosis not present

## 2018-03-12 DIAGNOSIS — H02831 Dermatochalasis of right upper eyelid: Secondary | ICD-10-CM | POA: Diagnosis not present

## 2018-03-12 DIAGNOSIS — H2513 Age-related nuclear cataract, bilateral: Secondary | ICD-10-CM | POA: Diagnosis not present

## 2018-11-05 ENCOUNTER — Ambulatory Visit: Payer: Self-pay | Admitting: *Deleted

## 2018-11-05 NOTE — Telephone Encounter (Signed)
I returned her call.   She is c/o fatigue, low grade nausea and clenching her teeth for 2 weeks.   The clenching of her teeth has been going on for several weeks.  Denies any other physical issues.   Eating and drinking fine even though her appetite is not as good as it normally is.   She is wondering if she is having some depression.  "I've never had depression but my daughter in law mentioned maybe I am".   I helped a friend clean out his house 2-3 weeks ago to move and he had a lot of stuff.    I wonder if it triggered memories for me when my husband died 4 yrs ago and I had to clean my house out after living there 40 years and move.   I'm happy where I'm living and it was a good decision but I just wonder.   There was another death in my family about 2 weeks ago and I'm wondering if that is bringing up memories of my husband's passing away.  I let her talk and share how she was feeling.  She has a prescription for Ativan.   She asked if that would help.   I let her know it may help with the clenching of her teeth if it's stress causing her to clench.   I warned her taking it during the day would make her sleepy.    She uses it now to help her sleep at night as she needs it. She asked if she could be depressed.   I let her know it's a possibility however she would need to come in and talk with Dr. Alain Marion for that diagnoses and treatment.  "I think I will wait one more week and see how I feel".   If I'm still feeling this way I will call back for an appt".    "Just talking with you has helped".   She thanked me for listening and my support.   She assured me she would call back next week if she was still having these symptoms.  I have sent my notes to the office of Dr. Alain Marion for their information.  Reason for Disposition . Nausea is a chronic symptom (recurrent or ongoing AND present > 4 weeks)  Answer Assessment - Initial Assessment Questions 1. NAUSEA SEVERITY: "How bad is the nausea?"  (e.g., mild, moderate, severe; dehydration, weight loss)   - MILD: loss of appetite without change in eating habits   - MODERATE: decreased oral intake without significant weight loss, dehydration, or malnutrition   - SEVERE: inadequate caloric or fluid intake, significant weight loss, symptoms of dehydration     For 2 weeks I feel a sense of nausea.   No vomiting.   I'm fatigued.   I feel tense and I'm clenching my teeth. 2. ONSET: "When did the nausea begin?"     2 weeks ago. 3. VOMITING: "Any vomiting?" If so, ask: "How many times today?"     No 4. RECURRENT SYMPTOM: "Have you had nausea before?" If so, ask: "When was the last time?" "What happened that time?"     20 yrs ago I had a spell like this with nausea for a couple of weeks.  It went away on its own.    I feel fatigued but I've had that before. No fever or abd pain.  No burning with urination.   No constipation or diarrhea. 5. CAUSE: "What do you think is causing  the nausea?"     I don't know.     My daughter asked if I may be depressed.   I don't feel depressed.   My husband passed away 4 yrs ago.   I try to keep moving and busy.   This COVID-19 pandemic could be part of it.     I don't feel depressed.  I clinch my teeth during the day.   I have had problems with hypoventilating long time ago.   It would come on suddenly.     I had swallowing frequency before but it went away.   I don't know why I'm clenching my teeth.   I don't feel depressed.   I've been clenching my teeth every day for several weeks.    Denies any pain anywhere.   6. PREGNANCY: "Is there any chance you are pregnant?" (e.g., unprotected intercourse, missed birth control pill, broken condom)     *No Answer*  Protocols used: NAUSEA-A-AH

## 2018-12-09 ENCOUNTER — Ambulatory Visit: Payer: Self-pay | Admitting: *Deleted

## 2018-12-09 ENCOUNTER — Encounter: Payer: Self-pay | Admitting: *Deleted

## 2018-12-09 ENCOUNTER — Telehealth: Payer: Self-pay

## 2018-12-09 DIAGNOSIS — Z0000 Encounter for general adult medical examination without abnormal findings: Secondary | ICD-10-CM

## 2018-12-09 NOTE — Telephone Encounter (Signed)
Copied from [place] [id number]. Topic: Appointment Scheduling - Scheduling Inquiry for Clinic >> Dec 09, 2018  9:45 AM Lennox Solders wrote: Reason for CRM: pt would like to have cpe labs in advance. Pt is sch for 01-14-2019 for cpe

## 2018-12-09 NOTE — Telephone Encounter (Signed)
Patient has noticed that she has a spot that itches- maybe a little larger than silver dollar.. One small brown spot in the area. Has been 1 month- on upper abdomen between breat and belly button. Patient states she has been treating it with OTC cortisone cream- but not helping. She tried not to scratch the area- but sometimes it is hard not to. Appointment scheduled for evaluation of skin.  Reason for Disposition . [1] Cause unknown AND [2] present > 7 days  Answer Assessment - Initial Assessment Questions 1. DESCRIPTION: "Describe the itching you are having." "Where is it located?"     Upper abdomen above the belly button-under breat 2. SEVERITY: "How bad is it?"    - MILD - doesn't interfere with normal activities   - MODERATE-SEVERE: interferes with work, school, sleep, or other activities      mild 3. SCRATCHING: "Are there any scratch marks? Bleeding?"     Yes- but not making marks 4. ONSET: "When did the itching begin?"      Possibly 1 month 5. CAUSE: "What do you think is causing the itching?"      unsure 6. OTHER SYMPTOMS: "Do you have any other symptoms?"      no 7. PREGNANCY: "Is there any chance you are pregnant?" "When was your last menstrual period?"     n/a  Protocols used: ITCHING - LOCALIZED-A-AH

## 2018-12-10 ENCOUNTER — Encounter: Payer: Self-pay | Admitting: Family

## 2018-12-10 ENCOUNTER — Ambulatory Visit (INDEPENDENT_AMBULATORY_CARE_PROVIDER_SITE_OTHER): Payer: Medicare Other | Admitting: Family

## 2018-12-10 ENCOUNTER — Other Ambulatory Visit: Payer: Self-pay

## 2018-12-10 ENCOUNTER — Other Ambulatory Visit (INDEPENDENT_AMBULATORY_CARE_PROVIDER_SITE_OTHER): Payer: Medicare Other

## 2018-12-10 VITALS — BP 130/70 | HR 80 | Temp 97.8°F | Ht 63.0 in | Wt 118.0 lb

## 2018-12-10 DIAGNOSIS — L989 Disorder of the skin and subcutaneous tissue, unspecified: Secondary | ICD-10-CM | POA: Diagnosis not present

## 2018-12-10 DIAGNOSIS — Z0000 Encounter for general adult medical examination without abnormal findings: Secondary | ICD-10-CM | POA: Diagnosis not present

## 2018-12-10 DIAGNOSIS — L309 Dermatitis, unspecified: Secondary | ICD-10-CM | POA: Diagnosis not present

## 2018-12-10 LAB — BASIC METABOLIC PANEL
BUN: 13 mg/dL (ref 6–23)
CO2: 32 mEq/L (ref 19–32)
Calcium: 9.8 mg/dL (ref 8.4–10.5)
Chloride: 100 mEq/L (ref 96–112)
Creatinine, Ser: 0.9 mg/dL (ref 0.40–1.20)
GFR: 59.85 mL/min — ABNORMAL LOW (ref >60.00)
Glucose, Bld: 85 mg/dL (ref 70–99)
Potassium: 3.8 mEq/L (ref 3.5–5.1)
Sodium: 138 mEq/L (ref 135–145)

## 2018-12-10 LAB — CBC WITH DIFFERENTIAL/PLATELET
Basophils Absolute: 0 10*3/uL (ref 0.0–0.1)
Basophils Relative: 0.3 % (ref 0.0–3.0)
Eosinophils Absolute: 0.1 10*3/uL (ref 0.0–0.7)
Eosinophils Relative: 3.6 % (ref 0.0–5.0)
HCT: 40.2 % (ref 36.0–46.0)
Hemoglobin: 13.4 g/dL (ref 12.0–15.0)
Lymphocytes Relative: 21.9 % (ref 12.0–46.0)
Lymphs Abs: 0.9 10*3/uL (ref 0.7–4.0)
MCHC: 33.3 g/dL (ref 30.0–36.0)
MCV: 89.4 fl (ref 78.0–100.0)
Monocytes Absolute: 0.3 10*3/uL (ref 0.1–1.0)
Monocytes Relative: 8.7 % (ref 3.0–12.0)
Neutro Abs: 2.6 10*3/uL (ref 1.4–7.7)
Neutrophils Relative %: 65.5 % (ref 43.0–77.0)
Platelets: 200 10*3/uL (ref 150.0–400.0)
RBC: 4.5 Mil/uL (ref 3.87–5.11)
RDW: 13.7 % (ref 11.5–15.5)
WBC: 3.9 10*3/uL — ABNORMAL LOW (ref 4.0–10.5)

## 2018-12-10 LAB — URINALYSIS, ROUTINE W REFLEX MICROSCOPIC
Bilirubin Urine: NEGATIVE
Hgb urine dipstick: NEGATIVE
Ketones, ur: NEGATIVE
Leukocytes,Ua: NEGATIVE
Nitrite: NEGATIVE
RBC / HPF: NONE SEEN (ref 0–?)
Specific Gravity, Urine: 1.015 (ref 1.000–1.030)
Total Protein, Urine: NEGATIVE
Urine Glucose: NEGATIVE
Urobilinogen, UA: 0.2 (ref 0.0–1.0)
WBC, UA: NONE SEEN (ref 0–?)
pH: 8 (ref 5.0–8.0)

## 2018-12-10 LAB — HEPATIC FUNCTION PANEL
ALT: 18 U/L (ref 0–35)
AST: 19 U/L (ref 0–37)
Albumin: 4.3 g/dL (ref 3.5–5.2)
Alkaline Phosphatase: 57 U/L (ref 39–117)
Bilirubin, Direct: 0.1 mg/dL (ref 0.0–0.3)
Total Bilirubin: 0.5 mg/dL (ref 0.2–1.2)
Total Protein: 6.9 g/dL (ref 6.0–8.3)

## 2018-12-10 LAB — LIPID PANEL
Cholesterol: 201 mg/dL — ABNORMAL HIGH (ref 0–200)
HDL: 89.4 mg/dL (ref >39.00)
LDL Cholesterol: 94 mg/dL (ref 0–99)
NonHDL: 111.55
Total CHOL/HDL Ratio: 2
Triglycerides: 89 mg/dL (ref 0.0–149.0)
VLDL: 17.8 mg/dL (ref 0.0–40.0)

## 2018-12-10 LAB — TSH: TSH: 2.59 u[IU]/mL (ref 0.35–4.50)

## 2018-12-10 MED ORDER — MOMETASONE FUROATE 0.1 % EX CREA: 1.0000 "application " | TOPICAL_CREAM | Freq: Two times a day (BID) | CUTANEOUS | 0 refills | Status: DC | PRN

## 2018-12-10 NOTE — Progress Notes (Signed)
Lindsay Maldonado is a 84 y.o. female with the following history as recorded in [employer]:  Patient Active Problem List   Diagnosis Date Noted  . Headache 11/13/2017  . Bilateral hearing loss 11/13/2017  . Sinus pressure 11/13/2017  . Radiculitis of leg 09/26/2017  . Grief 10/13/2016  . Right LBP 09/29/2014  . Actinic keratoses 09/05/2012  . Chest wall pain 09/05/2012  . Elevated BP without diagnosis of hypertension 09/04/2011  . Elbow pain, left 05/12/2010  . Well adult exam 05/12/2010  . WEIGHT LOSS 04/26/2009  . Hypothyroidism 03/12/2007  . Cough 03/12/2007  . CERVICAL STRAIN 03/12/2007  . GOITER, MULTINODULAR 11/29/2006  . Fibromyalgia 11/29/2006  . OSTEOPENIA 11/29/2006    Current Outpatient Medications  Medication Sig Dispense Refill  . Cholecalciferol (VITAMIN D3) 50000 units CAPS Take 1 capsule by mouth once a week. 6 capsule 0  . Coenzyme Q10 (CO Q-10 PO) Take by mouth.    [name] GARLIC PO Take by mouth.    [name] LORazepam (ATIVAN) 1 MG tablet Take 1 tablet (1 mg total) by mouth 2 (two) times daily as needed for anxiety or sleep. 60 tablet 5  . SYNTHROID 75 MCG tablet Take 1 tablet (75 mcg total) by mouth daily. 90 tablet 3  . triamterene-hydrochlorothiazide (MAXZIDE-25) 37.5-25 MG tablet Take 1 tablet by mouth daily. 90 tablet 3  . TURMERIC PO Take by mouth.    . mometasone (ELOCON) 0.1 % cream Apply 1 application topically 2 (two) times daily as needed. 45 g 0   No current facility-administered medications for this visit.     Allergies: Morphine  Past Medical History:  Diagnosis Date  . Cyst of breast   . Fibromyalgia   . Goiter    multinodular  . Hypothyroidism   . Osteopenia     Past Surgical History:  Procedure Laterality Date  . BREAST EXCISIONAL BIOPSY Left    benign  . BREAST EXCISIONAL BIOPSY Right    benign  . ENDOMETRIAL BIOPSY  2008   Dr. Deatra Ina  . ROTATOR CUFF REPAIR     Left  . THYROID SURGERY  2007   Nodule removed    Family History  Problem  Relation Age of Onset  . Cancer Mother 46       breast  . Parkinsonism Father     Social History   Tobacco Use  . Smoking status: Never Smoker  . Smokeless tobacco: Never Used  Substance Use Topics  . Alcohol use: No    Subjective:  Patient presents with concerns for "itching on abdomen." Notes that there is a localized area on her left upper abdomen that has been itching x 1 month; denies any new soaps, foods, detergents or medications.  Has used OTC hydrocortisone with some relief; Requesting that her skin be evaluated to make sure there are not any concerning lesions. No changes in appetitie or bowel movements; is not itching "all over."     Objective:  Vitals:   12/10/18 0930  BP: 130/70  Pulse: 80  Temp: 97.8 F (36.6 C)  TempSrc: Oral  SpO2: 98%  Weight: 118 lb (53.5 kg)  Height: 5\' 3"  (1.6 m)    General: Well developed, well nourished, in no acute distress  Skin : Warm and dry. 2 moles of concern noted: 1) on upper mid-back- dark in color; 2) right flank- mole with 2 different colors noted;  Head: Normocephalic and atraumatic  Lungs: Respirations unlabored;  Neurologic: Alert and oriented; speech intact; face symmetrical;  Assessment:  1. Dermatitis   2. Skin lesion of back     Plan:  1. Rx for Elocon cream; apply bid to affected area; will go ahead and have patient update her labs today; 2. She will discuss areas of concern at upcoming CPE with her PCP; he may opt to remove himself or consider dermatology consult. She is comfortable with this treatment plan.   No follow-ups on file.  No orders of the defined types were placed in this encounter.   Requested Prescriptions   Signed Prescriptions Disp Refills  . mometasone (ELOCON) 0.1 % cream 45 g 0    Sig: Apply 1 application topically 2 (two) times daily as needed.

## 2018-12-31 ENCOUNTER — Other Ambulatory Visit: Payer: Self-pay | Admitting: Internal Medicine

## 2018-12-31 DIAGNOSIS — G8929 Other chronic pain: Secondary | ICD-10-CM

## 2018-12-31 DIAGNOSIS — E785 Hyperlipidemia, unspecified: Secondary | ICD-10-CM

## 2018-12-31 DIAGNOSIS — E034 Atrophy of thyroid (acquired): Secondary | ICD-10-CM

## 2018-12-31 DIAGNOSIS — Z0000 Encounter for general adult medical examination without abnormal findings: Secondary | ICD-10-CM

## 2019-01-09 ENCOUNTER — Other Ambulatory Visit: Payer: Self-pay | Admitting: Internal Medicine

## 2019-01-09 DIAGNOSIS — N6459 Other signs and symptoms in breast: Secondary | ICD-10-CM

## 2019-01-14 ENCOUNTER — Encounter: Payer: Self-pay | Admitting: Internal Medicine

## 2019-01-14 ENCOUNTER — Ambulatory Visit (INDEPENDENT_AMBULATORY_CARE_PROVIDER_SITE_OTHER): Payer: Medicare Other | Admitting: Internal Medicine

## 2019-01-14 ENCOUNTER — Other Ambulatory Visit: Payer: Self-pay

## 2019-01-14 ENCOUNTER — Other Ambulatory Visit (INDEPENDENT_AMBULATORY_CARE_PROVIDER_SITE_OTHER): Payer: Medicare Other

## 2019-01-14 VITALS — BP 124/76 | HR 80 | Temp 98.3°F | Ht 63.0 in | Wt 119.0 lb

## 2019-01-14 DIAGNOSIS — L570 Actinic keratosis: Secondary | ICD-10-CM

## 2019-01-14 DIAGNOSIS — E034 Atrophy of thyroid (acquired): Secondary | ICD-10-CM | POA: Diagnosis not present

## 2019-01-14 DIAGNOSIS — R944 Abnormal results of kidney function studies: Secondary | ICD-10-CM | POA: Insufficient documentation

## 2019-01-14 DIAGNOSIS — L821 Other seborrheic keratosis: Secondary | ICD-10-CM | POA: Diagnosis not present

## 2019-01-14 DIAGNOSIS — E030 Congenital hypothyroidism with diffuse goiter: Secondary | ICD-10-CM | POA: Diagnosis not present

## 2019-01-14 DIAGNOSIS — M545 Low back pain, unspecified: Secondary | ICD-10-CM

## 2019-01-14 DIAGNOSIS — E785 Hyperlipidemia, unspecified: Secondary | ICD-10-CM

## 2019-01-14 DIAGNOSIS — G8929 Other chronic pain: Secondary | ICD-10-CM

## 2019-01-14 DIAGNOSIS — Z0000 Encounter for general adult medical examination without abnormal findings: Secondary | ICD-10-CM

## 2019-01-14 LAB — BASIC METABOLIC PANEL
BUN: 18 mg/dL (ref 6–23)
CO2: 33 mEq/L — ABNORMAL HIGH (ref 19–32)
Calcium: 9.7 mg/dL (ref 8.4–10.5)
Chloride: 100 mEq/L (ref 96–112)
Creatinine, Ser: 0.92 mg/dL (ref 0.40–1.20)
GFR: 58.34 mL/min — ABNORMAL LOW (ref >60.00)
Glucose, Bld: 96 mg/dL (ref 70–99)
Potassium: 3.5 mEq/L (ref 3.5–5.1)
Sodium: 138 mEq/L (ref 135–145)

## 2019-01-14 MED ORDER — TRIAMTERENE-HCTZ 37.5-25 MG PO TABS: 1.0000 | ORAL_TABLET | Freq: Every day | ORAL | 3 refills | Status: DC

## 2019-01-14 MED ORDER — SYNTHROID 75 MCG PO TABS: 75.0000 ug | ORAL_TABLET | Freq: Every day | ORAL | 3 refills | Status: DC

## 2019-01-14 MED ORDER — VITAMIN D3 50 MCG (2000 UT) PO CAPS: 2000.0000 [IU] | ORAL_CAPSULE | Freq: Every day | ORAL | 3 refills | Status: DC

## 2019-01-14 MED ORDER — LORAZEPAM 1 MG PO TABS: 1.0000 mg | ORAL_TABLET | Freq: Two times a day (BID) | ORAL | 5 refills | Status: DC | PRN

## 2019-01-14 NOTE — Patient Instructions (Signed)

## 2019-01-14 NOTE — Progress Notes (Signed)
Subjective:  Patient ID: Lindsay Maldonado, female    DOB: May 06, 1934  Age: 84 y.o. MRN: [id number]  CC: No chief complaint on file.   HPI Lindsay Maldonado presents for a well exam f/u  Outpatient Medications Prior to Visit  Medication Sig Dispense Refill  . Cholecalciferol (VITAMIN D3) 50000 units CAPS Take 1 capsule by mouth once a week. 6 capsule 0  . Coenzyme Q10 (CO Q-10 PO) Take by mouth.    [name] GARLIC PO Take by mouth.    [name] LORazepam (ATIVAN) 1 MG tablet Take 1 tablet (1 mg total) by mouth 2 (two) times daily as needed for anxiety or sleep. 60 tablet 5  . mometasone (ELOCON) 0.1 % cream Apply 1 application topically 2 (two) times daily as needed. 45 g 0  . SYNTHROID 75 MCG tablet TAKE ONE TABLET BY MOUTH ONE TIME DAILY  90 tablet 3  . TURMERIC PO Take by mouth.    . triamterene-hydrochlorothiazide (MAXZIDE-25) 37.5-25 MG tablet Take 1 tablet by mouth daily. 90 tablet 3   No facility-administered medications prior to visit.     ROS: Review of Systems  Constitutional: Negative for activity change, appetite change, chills, fatigue and unexpected weight change.  HENT: Negative for congestion, mouth sores and sinus pressure.   Eyes: Negative for visual disturbance.  Respiratory: Negative for cough and chest tightness.   Gastrointestinal: Negative for abdominal pain and nausea.  Genitourinary: Negative for difficulty urinating, frequency and vaginal pain.  Musculoskeletal: Negative for back pain and gait problem.  Skin: Negative for pallor and rash.  Neurological: Negative for dizziness, tremors, weakness, numbness and headaches.  Psychiatric/Behavioral: Negative for confusion, sleep disturbance and suicidal ideas.    Objective:  BP 124/76 (BP Location: Left Arm, Patient Position: Sitting, Cuff Size: Normal)   Pulse 80   Temp 98.3 F (36.8 C) (Oral)   Ht 5\' 3"  (1.6 m)   Wt 119 lb (54 kg)   SpO2 97%   BMI 21.08 kg/m   BP Readings from Last 3 Encounters:  01/14/19  124/76  12/10/18 130/70  01/07/18 134/72    Wt Readings from Last 3 Encounters:  01/14/19 119 lb (54 kg)  12/10/18 118 lb (53.5 kg)  01/07/18 114 lb (51.7 kg)    Physical Exam Constitutional:      General: She is not in acute distress.    Appearance: She is well-developed.  HENT:     Head: Normocephalic.     Right Ear: External ear normal.     Left Ear: External ear normal.     Nose: Nose normal.  Eyes:     General:        Right eye: No discharge.        Left eye: No discharge.     Conjunctiva/sclera: Conjunctivae normal.     Pupils: Pupils are equal, round, and reactive to light.  Neck:     Musculoskeletal: Normal range of motion and neck supple.     Thyroid: No thyromegaly.     Vascular: No JVD.     Trachea: No tracheal deviation.  Cardiovascular:     Rate and Rhythm: Normal rate and regular rhythm.     Heart sounds: Normal heart sounds.  Pulmonary:     Effort: No respiratory distress.     Breath sounds: No stridor. No wheezing.  Abdominal:     General: Bowel sounds are normal. There is no distension.     Palpations: Abdomen is soft. There is no  mass.     Tenderness: There is no abdominal tenderness. There is no guarding or rebound.  Musculoskeletal:        General: No tenderness.  Lymphadenopathy:     Cervical: No cervical adenopathy.  Skin:    Findings: No erythema or rash.  Neurological:     Cranial Nerves: No cranial nerve deficit.     Motor: No abnormal muscle tone.     Coordination: Coordination normal.     Deep Tendon Reflexes: Reflexes normal.  Psychiatric:        Behavior: Behavior normal.        Thought Content: Thought content normal.        Judgment: Judgment normal.   SKs  Lab Results  Component Value Date   WBC 3.9 (L) 12/10/2018   HGB 13.4 12/10/2018   HCT 40.2 12/10/2018   PLT 200.0 12/10/2018   GLUCOSE 85 12/10/2018   CHOL 201 (H) 12/10/2018   TRIG 89.0 12/10/2018   HDL 89.40 12/10/2018   LDLCALC 94 12/10/2018   ALT 18  12/10/2018   AST 19 12/10/2018   NA 138 12/10/2018   K 3.8 12/10/2018   CL 100 12/10/2018   CREATININE 0.90 12/10/2018   BUN 13 12/10/2018   CO2 32 12/10/2018   TSH 2.59 12/10/2018    Dg Lumbar Spine 2-3 Views  Result Date: 09/26/2017 CLINICAL DATA:  RIGHT leg pain for 7 weeks.  No known injury. EXAM: LUMBAR SPINE - 2-3 VIEW COMPARISON:  None. FINDINGS: There is no evidence of lumbar spine fracture. Alignment is normal. Advanced disc space narrowing L4-5 with osseous spurring. Mild disc space narrowing L3-4 and L5-S1. Vascular calcification. No worrisome osseous lesion. Moderate stool burden. IMPRESSION: Lumbar spondylosis as described, most pronounced at L4-5. If further investigation desired, consider cross-sectional imaging. Electronically Signed   By: Staci Righter M.D.   On: 09/26/2017 15:50    Assessment & Plan:   There are no diagnoses linked to this encounter.   No orders of the defined types were placed in this encounter.    Follow-up: No follow-ups on file.  Walker Kehr, MD

## 2019-01-14 NOTE — Assessment & Plan Note (Signed)
Removal/bx offered

## 2019-01-14 NOTE — Assessment & Plan Note (Signed)
We discussed age appropriate health related issues, including available/recomended screening tests and vaccinations. We discussed a need for adhering to healthy diet and exercise. Labs were ordered to be later reviewed . All questions were answered.   

## 2019-01-14 NOTE — Assessment & Plan Note (Addendum)
BMET US renal No NSAIDs

## 2019-01-14 NOTE — Assessment & Plan Note (Signed)
Labs

## 2019-01-17 ENCOUNTER — Other Ambulatory Visit: Payer: Self-pay | Admitting: Internal Medicine

## 2019-01-17 DIAGNOSIS — R944 Abnormal results of kidney function studies: Secondary | ICD-10-CM

## 2019-01-21 ENCOUNTER — Ambulatory Visit
Admission: RE | Admit: 2019-01-21 | Discharge: 2019-01-21 | Disposition: A | Discharge status: Home / Self Care | Payer: Medicare Other | Source: Ambulatory Visit | Attending: Internal Medicine | Admitting: Internal Medicine

## 2019-01-21 ENCOUNTER — Other Ambulatory Visit: Payer: Self-pay

## 2019-01-21 DIAGNOSIS — N6459 Other signs and symptoms in breast: Secondary | ICD-10-CM

## 2019-01-21 DIAGNOSIS — N6489 Other specified disorders of breast: Secondary | ICD-10-CM | POA: Diagnosis not present

## 2019-01-21 DIAGNOSIS — R928 Other abnormal and inconclusive findings on diagnostic imaging of breast: Secondary | ICD-10-CM | POA: Diagnosis not present

## 2019-01-30 ENCOUNTER — Other Ambulatory Visit: Payer: Self-pay

## 2019-01-30 ENCOUNTER — Ambulatory Visit
Admission: RE | Admit: 2019-01-30 | Discharge: 2019-01-30 | Disposition: A | Discharge status: Home / Self Care | Payer: Medicare Other | Source: Ambulatory Visit | Attending: Internal Medicine | Admitting: Internal Medicine

## 2019-01-30 DIAGNOSIS — R944 Abnormal results of kidney function studies: Secondary | ICD-10-CM

## 2019-01-30 DIAGNOSIS — N281 Cyst of kidney, acquired: Secondary | ICD-10-CM | POA: Diagnosis not present

## 2019-02-05 ENCOUNTER — Telehealth: Payer: Self-pay | Admitting: Internal Medicine

## 2019-02-05 DIAGNOSIS — N281 Cyst of kidney, acquired: Secondary | ICD-10-CM

## 2019-02-05 NOTE — Telephone Encounter (Signed)
pt states she would like to proceed with MRI but would like to wait till the beginning of the new year

## 2019-02-05 NOTE — Telephone Encounter (Signed)
Patient would like to speak with Dr. Enis Slipper' CMA about scheduling a MRI of her kidneys. Please call patient back, thanks.

## 2019-02-13 NOTE — Telephone Encounter (Signed)
Ok Thx 

## 2019-02-24 NOTE — Telephone Encounter (Signed)
Patient called stating she is wants to wait to do the MRI.  Patient states PCP had another plan for her.  Patient is requesting nurse to call back  Call back [phone]

## 2019-02-26 NOTE — Telephone Encounter (Signed)
Pt would like to wait and do [place] in 4-6 months and would like to know if you would like to know if you would recommend bloodwork before?

## 2019-03-03 NOTE — Telephone Encounter (Signed)
MRI would be a better test.  It has been ordered. Lab work was ordered. Thanks

## 2019-03-05 NOTE — Telephone Encounter (Signed)
VOV scheduled, pt would like to discuss more in detail

## 2019-03-10 ENCOUNTER — Other Ambulatory Visit: Payer: Self-pay

## 2019-03-10 ENCOUNTER — Encounter: Payer: Self-pay | Admitting: Internal Medicine

## 2019-03-10 ENCOUNTER — Ambulatory Visit: Payer: Medicare Other | Admitting: Internal Medicine

## 2019-03-10 DIAGNOSIS — I10 Essential (primary) hypertension: Secondary | ICD-10-CM

## 2019-03-10 DIAGNOSIS — R944 Abnormal results of kidney function studies: Secondary | ICD-10-CM | POA: Diagnosis not present

## 2019-03-10 DIAGNOSIS — F40240 Claustrophobia: Secondary | ICD-10-CM

## 2019-03-10 DIAGNOSIS — R634 Abnormal weight loss: Secondary | ICD-10-CM

## 2019-03-10 DIAGNOSIS — N281 Cyst of kidney, acquired: Secondary | ICD-10-CM | POA: Insufficient documentation

## 2019-03-10 LAB — BASIC METABOLIC PANEL
BUN: 13 mg/dL (ref 6–23)
CO2: 29 mEq/L (ref 19–32)
Calcium: 9.6 mg/dL (ref 8.4–10.5)
Chloride: 101 mEq/L (ref 96–112)
Creatinine, Ser: 0.83 mg/dL (ref 0.40–1.20)
GFR: 65.67 mL/min (ref >60.00)
Glucose, Bld: 74 mg/dL (ref 70–99)
Potassium: 3.3 mEq/L — ABNORMAL LOW (ref 3.5–5.1)
Sodium: 140 mEq/L (ref 135–145)

## 2019-03-10 NOTE — Assessment & Plan Note (Signed)
Maxzide 1/2 tab a day BMET

## 2019-03-10 NOTE — Progress Notes (Signed)
Subjective:  Patient ID: Lindsay Maldonado, female    DOB: December 02, 1934  Age: 85 y.o. MRN: [id number]  CC: No chief complaint on file.   HPI Lindsay Maldonado presents for abn cyst on the R kidney On Maxzide 1/2 tab/d now.  The patient is complaining of claustrophobia.  She is fearful of getting into the MRI machine.  She has never had an open MRI done, however.  Outpatient Medications Prior to Visit  Medication Sig Dispense Refill  . Cholecalciferol (VITAMIN D3) 50 MCG (2000 UT) capsule Take 1 capsule (2,000 Units total) by mouth daily. 100 capsule 3  . Coenzyme Q10 (CO Q-10 PO) Take by mouth.    [name] GARLIC PO Take by mouth.    [name] LORazepam (ATIVAN) 1 MG tablet Take 1 tablet (1 mg total) by mouth 2 (two) times daily as needed for anxiety or sleep. 60 tablet 5  . mometasone (ELOCON) 0.1 % cream Apply 1 application topically 2 (two) times daily as needed. 45 g 0  . SYNTHROID 75 MCG tablet Take 1 tablet (75 mcg total) by mouth daily. 90 tablet 3  . triamterene-hydrochlorothiazide (MAXZIDE-25) 37.5-25 MG tablet Take 1 tablet by mouth daily. 90 tablet 3  . TURMERIC PO Take by mouth.     No facility-administered medications prior to visit.    ROS: Review of Systems  Constitutional: Negative for activity change, appetite change, chills, fatigue and unexpected weight change.  HENT: Negative for congestion, mouth sores and sinus pressure.   Eyes: Negative for visual disturbance.  Respiratory: Negative for cough and chest tightness.   Gastrointestinal: Negative for abdominal pain and nausea.  Genitourinary: Negative for difficulty urinating, frequency and vaginal pain.  Musculoskeletal: Negative for back pain and gait problem.  Skin: Negative for pallor and rash.  Neurological: Negative for dizziness, tremors, weakness, numbness and headaches.  Psychiatric/Behavioral: Negative for confusion and sleep disturbance.    Objective:  BP 140/82 (BP Location: Left Arm, Patient Position: Sitting,  Cuff Size: Normal)   Pulse 80   Temp 98.2 F (36.8 C) (Oral)   Ht 5\' 3"  (1.6 m)   Wt 118 lb (53.5 kg)   SpO2 96%   BMI 20.90 kg/m   BP Readings from Last 3 Encounters:  03/10/19 140/82  01/14/19 124/76  12/10/18 130/70    Wt Readings from Last 3 Encounters:  03/10/19 118 lb (53.5 kg)  01/14/19 119 lb (54 kg)  12/10/18 118 lb (53.5 kg)    Physical Exam Constitutional:      General: She is not in acute distress.    Appearance: She is well-developed.  HENT:     Head: Normocephalic.     Right Ear: External ear normal.     Left Ear: External ear normal.     Nose: Nose normal.  Eyes:     General:        Right eye: No discharge.        Left eye: No discharge.     Conjunctiva/sclera: Conjunctivae normal.     Pupils: Pupils are equal, round, and reactive to light.  Neck:     Thyroid: No thyromegaly.     Vascular: No JVD.     Trachea: No tracheal deviation.  Cardiovascular:     Rate and Rhythm: Normal rate and regular rhythm.     Heart sounds: Normal heart sounds.  Pulmonary:     Effort: No respiratory distress.     Breath sounds: No stridor. No wheezing.  Abdominal:  General: Bowel sounds are normal. There is no distension.     Palpations: Abdomen is soft. There is no mass.     Tenderness: There is no abdominal tenderness. There is no guarding or rebound.  Musculoskeletal:        General: No tenderness.     Cervical back: Normal range of motion and neck supple.  Lymphadenopathy:     Cervical: No cervical adenopathy.  Skin:    Findings: No erythema or rash.  Neurological:     Cranial Nerves: No cranial nerve deficit.     Motor: No abnormal muscle tone.     Coordination: Coordination normal.     Deep Tendon Reflexes: Reflexes normal.  Psychiatric:        Behavior: Behavior normal.        Thought Content: Thought content normal.        Judgment: Judgment normal.     Lab Results  Component Value Date   WBC 3.9 (L) 12/10/2018   HGB 13.4 12/10/2018   HCT  40.2 12/10/2018   PLT 200.0 12/10/2018   GLUCOSE 96 01/14/2019   CHOL 201 (H) 12/10/2018   TRIG 89.0 12/10/2018   HDL 89.40 12/10/2018   LDLCALC 94 12/10/2018   ALT 18 12/10/2018   AST 19 12/10/2018   NA 138 01/14/2019   K 3.5 01/14/2019   CL 100 01/14/2019   CREATININE 0.92 01/14/2019   BUN 18 01/14/2019   CO2 33 (H) 01/14/2019   TSH 2.59 12/10/2018    US Renal  Result Date: 01/30/2019 CLINICAL DATA:  Reduced GFR/renal function EXAM: RENAL / URINARY TRACT ULTRASOUND COMPLETE COMPARISON:  None. FINDINGS: Right Kidney: Renal measurements: 9.9 by 3.8 by 4.7 cm = volume: 92 mL . Echogenicity within normal limits. No hydronephrosis or discrete renal calculi observed. In the mid to lower kidney, an exophytic 0.9 by 0.8 by 0.8 cm lesion with internal echoes is observed for example on image 14/1 and image 21/1. This likely has enhanced through transmission and is most likely a complex cyst but a small solid mass is not readily excluded given the internal echoes. Left Kidney: Renal measurements: 9.6 by 5.3 by 4.4 cm = volume: 117 mL. In the left kidney lower pole, a 1.3 by 0.9 by 0.9 cm hypoechoic exophytic lesion with internal echoes is present. There is enhanced through transmission and this is most likely a complex cyst. In the left kidney upper pole, a 4.7 by 4.0 by 3.8 cm simple appearing exophytic cyst is present. Renal parenchymal echogenicity is normal. No discrete stones or hydronephrosis. Bladder: Appears normal for degree of bladder distention. Other: None. IMPRESSION: 1. Single bilateral complex renal lesions with internal echoes but enhanced through transmission favoring complex cysts, although strictly speaking, enhancement characteristics are not interrogated. If the patient has hematuria or if otherwise clinically warranted, renal protocol MRI with and without contrast may be helpful for further characterization. 2. There is also a larger simple appearing cyst of the left kidney upper  pole. Electronically Signed   By: Van Clines M.D.   On: 01/30/2019 17:28    Assessment & Plan:   There are no diagnoses linked to this encounter.   No orders of the defined types were placed in this encounter.    Follow-up: No follow-ups on file.  Walker Kehr, MD

## 2019-03-10 NOTE — Assessment & Plan Note (Signed)
Wt Readings from Last 3 Encounters:  03/10/19 118 lb (53.5 kg)  01/14/19 119 lb (54 kg)  12/10/18 118 lb (53.5 kg)

## 2019-03-10 NOTE — Assessment & Plan Note (Signed)
US IMPRESSION: 1. Single bilateral complex renal lesions with internal echoes but enhanced through transmission favoring complex cysts, although strictly speaking, enhancement characteristics are not interrogated. If the patient has hematuria or if otherwise clinically warranted, renal protocol MRI with and without contrast may be helpful for further characterization. 2. There is also a larger simple appearing cyst of the left kidney upper pole.   Electronically Signed   By: Van Clines M.D.   On: 01/30/2019 17:28

## 2019-03-10 NOTE — Assessment & Plan Note (Addendum)
Discussed open MRI Lorazepam prior to the test.  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

## 2019-03-10 NOTE — Assessment & Plan Note (Signed)
Maxzide 1/2 tab/d 

## 2019-03-18 ENCOUNTER — Other Ambulatory Visit: Payer: Medicare Other

## 2019-03-19 ENCOUNTER — Telehealth: Payer: Self-pay

## 2019-03-19 NOTE — Telephone Encounter (Signed)
New message   The patient wants to talk with the nurse on an open MRI machine.   [facility] does not have an open MRI machine.

## 2019-03-20 NOTE — Telephone Encounter (Signed)
See my chart message

## 2019-03-20 NOTE — Telephone Encounter (Signed)
Pt has been scheduled at [facility] and pt is aware

## 2019-03-27 DIAGNOSIS — N281 Cyst of kidney, acquired: Secondary | ICD-10-CM | POA: Diagnosis not present

## 2019-03-28 ENCOUNTER — Telehealth: Payer: Self-pay | Admitting: Internal Medicine

## 2019-03-28 NOTE — Telephone Encounter (Signed)
   Patient calling to request MRI results Please call

## 2019-03-28 NOTE — Telephone Encounter (Signed)
MRI results in [place].  Please advise

## 2019-03-29 NOTE — Telephone Encounter (Signed)
Renal cysts are benign on the MRI-good news! Thanks,

## 2019-04-01 NOTE — Telephone Encounter (Signed)
Pt.notified

## 2019-06-04 ENCOUNTER — Telehealth: Payer: Self-pay | Admitting: Internal Medicine

## 2019-06-04 NOTE — Telephone Encounter (Signed)
New message:   Pt is calling to see if she should be fasting before her appt so she can get labs done. Or should she get some labs done before the appt. Please advise.

## 2019-06-09 ENCOUNTER — Ambulatory Visit (INDEPENDENT_AMBULATORY_CARE_PROVIDER_SITE_OTHER): Payer: Medicare Other

## 2019-06-09 ENCOUNTER — Encounter: Payer: Self-pay | Admitting: Internal Medicine

## 2019-06-09 ENCOUNTER — Other Ambulatory Visit: Payer: Self-pay

## 2019-06-09 ENCOUNTER — Ambulatory Visit: Payer: Medicare Other | Admitting: Internal Medicine

## 2019-06-09 VITALS — BP 146/74 | HR 76 | Temp 98.2°F | Ht 63.0 in | Wt 120.0 lb

## 2019-06-09 DIAGNOSIS — E876 Hypokalemia: Secondary | ICD-10-CM

## 2019-06-09 DIAGNOSIS — R05 Cough: Secondary | ICD-10-CM

## 2019-06-09 DIAGNOSIS — I10 Essential (primary) hypertension: Secondary | ICD-10-CM

## 2019-06-09 DIAGNOSIS — M722 Plantar fascial fibromatosis: Secondary | ICD-10-CM | POA: Diagnosis not present

## 2019-06-09 DIAGNOSIS — R059 Cough, unspecified: Secondary | ICD-10-CM

## 2019-06-09 LAB — BASIC METABOLIC PANEL
BUN: 14 mg/dL (ref 6–23)
CO2: 31 mEq/L (ref 19–32)
Calcium: 9.4 mg/dL (ref 8.4–10.5)
Chloride: 99 mEq/L (ref 96–112)
Creatinine, Ser: 0.81 mg/dL (ref 0.40–1.20)
GFR: 67.51 mL/min (ref >60.00)
Glucose, Bld: 99 mg/dL (ref 70–99)
Potassium: 3.7 mEq/L (ref 3.5–5.1)
Sodium: 136 mEq/L (ref 135–145)

## 2019-06-09 MED ORDER — LORATADINE 10 MG PO TABS: 10.0000 mg | ORAL_TABLET | Freq: Every day | ORAL | 3 refills | Status: DC

## 2019-06-09 MED ORDER — FLUTICASONE PROPIONATE 50 MCG/ACT NA SUSP: 2.0000 | Freq: Every day | NASAL | 6 refills | Status: DC

## 2019-06-09 MED ORDER — BENZONATATE 200 MG PO CAPS: 200.0000 mg | ORAL_CAPSULE | Freq: Three times a day (TID) | ORAL | 1 refills | Status: DC | PRN

## 2019-06-09 NOTE — Patient Instructions (Addendum)
Plantar Fasciitis  Plantar fasciitis is a painful foot condition that affects the heel. It occurs when the band of tissue that connects the toes to the heel bone (plantar fascia) becomes irritated. This can happen as the result of exercising too much or doing other repetitive activities (overuse injury). The pain from plantar fasciitis can range from mild irritation to severe pain that makes it difficult to walk or move. The pain is usually worse in the morning after sleeping, or after sitting or lying down for a while. Pain may also be worse after long periods of walking or standing. What are the causes? This condition may be caused by:  Standing for long periods of time.  Wearing shoes that do not have good arch support.  Doing activities that put stress on joints (high-impact activities), including running, aerobics, and ballet.  Being overweight.  An abnormal way of walking (gait).  Tight muscles in the back of your lower leg (calf).  High arches in your feet.  Starting a new athletic activity. What are the signs or symptoms? The main symptom of this condition is heel pain. Pain may:  Be worse with first steps after a time of rest, especially in the morning after sleeping or after you have been sitting or lying down for a while.  Be worse after long periods of standing still.  Decrease after 30-45 minutes of activity, such as gentle walking. How is this diagnosed? This condition may be diagnosed based on your medical history and your symptoms. Your health care provider may ask questions about your activity level. Your health care provider will do a physical exam to check for:  A tender area on the bottom of your foot.  A high arch in your foot.  Pain when you move your foot.  Difficulty moving your foot. You may have imaging tests to confirm the diagnosis, such as:  X-rays.  Ultrasound.  MRI. How is this treated? Treatment for plantar fasciitis depends on how  severe your condition is. Treatment may include:  Rest, ice, applying pressure (compression), and raising the affected foot (elevation). This may be called RICE therapy. Your health care provider may recommend RICE therapy along with over-the-counter pain medicines to manage your pain.  Exercises to stretch your calves and your plantar fascia.  A splint that holds your foot in a stretched, upward position while you sleep (night splint).  Physical therapy to relieve symptoms and prevent problems in the future.  Injections of steroid medicine (cortisone) to relieve pain and inflammation.  Stimulating your plantar fascia with electrical impulses (extracorporeal shock wave therapy). This is usually the last treatment option before surgery.  Surgery, if other treatments have not worked after 12 months. Follow these instructions at home:  Managing pain, stiffness, and swelling  If directed, put ice on the painful area: ? Put ice in a plastic bag, or use a frozen bottle of water. ? Place a towel between your skin and the bag or bottle. ? Roll the bottom of your foot over the bag or bottle. ? Do this for 20 minutes, 2-3 times a day.  Wear athletic shoes that have air-sole or gel-sole cushions, or try wearing soft shoe inserts that are designed for plantar fasciitis.  Raise (elevate) your foot above the level of your heart while you are sitting or lying down. Activity  Avoid activities that cause pain. Ask your health care provider what activities are safe for you.  Do physical therapy exercises and stretches as told   by your health care provider.  Try activities and forms of exercise that are easier on your joints (low-impact). Examples include swimming, water aerobics, and biking. General instructions  Take over-the-counter and prescription medicines only as told by your health care provider.  Wear a night splint while sleeping, if told by your health care provider. Loosen the splint  if your toes tingle, become numb, or turn cold and blue.  Maintain a healthy weight, or work with your health care provider to lose weight as needed.  Keep all follow-up visits as told by your health care provider. This is important. Contact a health care provider if you:  Have symptoms that do not go away after caring for yourself at home.  Have pain that gets worse.  Have pain that affects your ability to move or do your daily activities. Summary  Plantar fasciitis is a painful foot condition that affects the heel. It occurs when the band of tissue that connects the toes to the heel bone (plantar fascia) becomes irritated.  The main symptom of this condition is heel pain that may be worse after exercising too much or standing still for a long time.  Treatment varies, but it usually starts with rest, ice, compression, and elevation (RICE therapy) and over-the-counter medicines to manage pain. This information is not intended to replace advice given to you by your health care provider. Make sure you discuss any questions you have with your health care provider. Document Revised: 01/19/2017 Document Reviewed: 12/04/2016 Elsevier Patient Education  [place] root Melatonin Benadryl   IMPRESSION:  Multiple simple left renal cysts. No abnormal enhancement or nodularity. No obstruction. Symmetric nephrograms.  Electronically Signed by: Florence Canner  Result Narrative  EXAM: MRI abdomen with and without contrast DATE: 03/27/2019 10:00 AM ACCESSION: [id number] DICTATED: 03/27/2019 12:54 PM  CLINICAL INDICATION: 85 years old Female with Abnormal results of kidney function studies, renal cyst  COMPARISON: None  TECHNIQUE: MRI of the abdomen was obtained without and with 10 mL of IV ProHance. Multisequence, multiplanar images were obtained.    FINDINGS: Lung bases are clear.  Normal hepatic size and contour. Tiny hepatic cysts. No biliary dilatation. Gallbladder is  unremarkable.  The spleen, adrenal glands, and pancreas are unremarkable.  Exophytic superior pole left renal cyst measures 3.9 x 2.9 cm without nodularity or abnormal enhancement. There is a smaller inferior pole left renal cysts and there are tiny right renal cysts. No nodularity or abnormal enhancement. Symmetric nephrograms. No obstruction.  No bowel obstruction or acute inflammation identified. No ascites.  Normal caliber abdominal aorta. Mild atherosclerotic findings. Unremarkable head CT. No enhancing osseous lesions.  Other Result Information  Acute Interface, Incoming Rad Results - 03/27/2019  1:00 PM EST EXAM: MRI abdomen with and without contrast DATE: 03/27/2019 10:00 AM ACCESSION: [id number] DICTATED: 03/27/2019 12:54 PM  CLINICAL INDICATION: 85 years old Female with Abnormal results of kidney function studies, renal cyst  COMPARISON: None  TECHNIQUE: MRI of the abdomen was obtained without and with 10 mL of IV ProHance. Multisequence, multiplanar images were obtained.      FINDINGS: Lung bases are clear.  Normal hepatic size and contour. Tiny hepatic cysts. No biliary dilatation. Gallbladder is unremarkable.  The spleen, adrenal glands, and pancreas are unremarkable.  Exophytic superior pole left renal cyst measures 3.9 x 2.9 cm without nodularity or abnormal enhancement. There is a smaller inferior pole left renal cysts and there are tiny right renal cysts. No nodularity or abnormal enhancement.  Symmetric nephrograms. No obstruction.  No bowel obstruction or acute inflammation identified. No ascites.  Normal caliber abdominal aorta. Mild atherosclerotic findings. Unremarkable head CT. No enhancing osseous lesions.   IMPRESSION:   Multiple simple left renal cysts. No abnormal enhancement or nodularity. No obstruction. Symmetric nephrograms.  Electronically Signed by: Florence Canner  Status Results Details   Unavailable  Date Index Date  Index 2021 2021 February Result Index Result Index MRI Abdomen WO W IV Contrast MRI Abdomen WO W IV Contrast 03/27/2019

## 2019-06-09 NOTE — Assessment & Plan Note (Signed)
Labs

## 2019-06-09 NOTE — Assessment & Plan Note (Signed)
Ice Arch supports

## 2019-06-09 NOTE — Assessment & Plan Note (Signed)
CXR Tessalon Claritin, Flonase

## 2019-06-09 NOTE — Progress Notes (Signed)
Subjective:  Patient ID: Lindsay Maldonado, female    DOB: 1934-11-11  Age: 85 y.o. MRN: [id number]  CC: No chief complaint on file.   HPI Lindsay Maldonado presents for renal cyst C/o cough x years - worse in am F/u HTN, low K C/o R foot pain  Outpatient Medications Prior to Visit  Medication Sig Dispense Refill  . Cholecalciferol (VITAMIN D3) 50 MCG (2000 UT) capsule Take 1 capsule (2,000 Units total) by mouth daily. 100 capsule 3  . LORazepam (ATIVAN) 1 MG tablet Take 1 tablet (1 mg total) by mouth 2 (two) times daily as needed for anxiety or sleep. 60 tablet 5  . mometasone (ELOCON) 0.1 % cream Apply 1 application topically 2 (two) times daily as needed. 45 g 0  . SYNTHROID 75 MCG tablet Take 1 tablet (75 mcg total) by mouth daily. 90 tablet 3  . triamterene-hydrochlorothiazide (MAXZIDE-25) 37.5-25 MG tablet Take 1 tablet by mouth daily. 90 tablet 3  . TURMERIC PO Take by mouth.    . Coenzyme Q10 (CO Q-10 PO) Take by mouth.    [name] GARLIC PO Take by mouth.     No facility-administered medications prior to visit.    ROS: Review of Systems  Constitutional: Negative for activity change, appetite change, chills, fatigue and unexpected weight change.  HENT: Negative for congestion, mouth sores and sinus pressure.   Eyes: Negative for visual disturbance.  Respiratory: Positive for cough. Negative for chest tightness, shortness of breath and wheezing.   Gastrointestinal: Negative for abdominal pain and nausea.  Genitourinary: Negative for difficulty urinating, frequency and vaginal pain.  Musculoskeletal: Negative for back pain and gait problem.  Skin: Negative for pallor and rash.  Neurological: Negative for dizziness, tremors, weakness, numbness and headaches.  Psychiatric/Behavioral: Negative for confusion and sleep disturbance.    Objective:  BP (!) 146/74 (BP Location: Left Arm, Patient Position: Sitting, Cuff Size: Normal)   Pulse 76   Temp 98.2 F (36.8 C) (Oral)   Ht  5\' 3"  (1.6 m)   Wt 120 lb (54.4 kg)   SpO2 97%   BMI 21.26 kg/m   BP Readings from Last 3 Encounters:  06/09/19 (!) 146/74  03/10/19 140/82  01/14/19 124/76    Wt Readings from Last 3 Encounters:  06/09/19 120 lb (54.4 kg)  03/10/19 118 lb (53.5 kg)  01/14/19 119 lb (54 kg)    Physical Exam Constitutional:      General: She is not in acute distress.    Appearance: She is well-developed.  HENT:     Head: Normocephalic.     Right Ear: External ear normal.     Left Ear: External ear normal.     Nose: Nose normal.  Eyes:     General:        Right eye: No discharge.        Left eye: No discharge.     Conjunctiva/sclera: Conjunctivae normal.     Pupils: Pupils are equal, round, and reactive to light.  Neck:     Thyroid: No thyromegaly.     Vascular: No JVD.     Trachea: No tracheal deviation.  Cardiovascular:     Rate and Rhythm: Normal rate and regular rhythm.     Heart sounds: Normal heart sounds.  Pulmonary:     Effort: No respiratory distress.     Breath sounds: No stridor. No wheezing.  Abdominal:     General: Bowel sounds are normal. There is no distension.  Palpations: Abdomen is soft. There is no mass.     Tenderness: There is no abdominal tenderness. There is no guarding or rebound.  Musculoskeletal:        General: Tenderness present.     Cervical back: Normal range of motion and neck supple.  Lymphadenopathy:     Cervical: No cervical adenopathy.  Skin:    Findings: No erythema or rash.  Neurological:     Cranial Nerves: No cranial nerve deficit.     Motor: No abnormal muscle tone.     Coordination: Coordination normal.     Deep Tendon Reflexes: Reflexes normal.  Psychiatric:        Behavior: Behavior normal.        Thought Content: Thought content normal.        Judgment: Judgment normal.   L heel tender  Lab Results  Component Value Date   WBC 3.9 (L) 12/10/2018   HGB 13.4 12/10/2018   HCT 40.2 12/10/2018   PLT 200.0 12/10/2018    GLUCOSE 74 03/10/2019   CHOL 201 (H) 12/10/2018   TRIG 89.0 12/10/2018   HDL 89.40 12/10/2018   LDLCALC 94 12/10/2018   ALT 18 12/10/2018   AST 19 12/10/2018   NA 140 03/10/2019   K 3.3 (L) 03/10/2019   CL 101 03/10/2019   CREATININE 0.83 03/10/2019   BUN 13 03/10/2019   CO2 29 03/10/2019   TSH 2.59 12/10/2018    US Renal  Result Date: 01/30/2019 CLINICAL DATA:  Reduced GFR/renal function EXAM: RENAL / URINARY TRACT ULTRASOUND COMPLETE COMPARISON:  None. FINDINGS: Right Kidney: Renal measurements: 9.9 by 3.8 by 4.7 cm = volume: 92 mL . Echogenicity within normal limits. No hydronephrosis or discrete renal calculi observed. In the mid to lower kidney, an exophytic 0.9 by 0.8 by 0.8 cm lesion with internal echoes is observed for example on image 14/1 and image 21/1. This likely has enhanced through transmission and is most likely a complex cyst but a small solid mass is not readily excluded given the internal echoes. Left Kidney: Renal measurements: 9.6 by 5.3 by 4.4 cm = volume: 117 mL. In the left kidney lower pole, a 1.3 by 0.9 by 0.9 cm hypoechoic exophytic lesion with internal echoes is present. There is enhanced through transmission and this is most likely a complex cyst. In the left kidney upper pole, a 4.7 by 4.0 by 3.8 cm simple appearing exophytic cyst is present. Renal parenchymal echogenicity is normal. No discrete stones or hydronephrosis. Bladder: Appears normal for degree of bladder distention. Other: None. IMPRESSION: 1. Single bilateral complex renal lesions with internal echoes but enhanced through transmission favoring complex cysts, although strictly speaking, enhancement characteristics are not interrogated. If the patient has hematuria or if otherwise clinically warranted, renal protocol MRI with and without contrast may be helpful for further characterization. 2. There is also a larger simple appearing cyst of the left kidney upper pole. Electronically Signed   By: Van Clines M.D.   On: 01/30/2019 17:28    Assessment & Plan:   There are no diagnoses linked to this encounter.   No orders of the defined types were placed in this encounter.    Follow-up: No follow-ups on file.  Walker Kehr, MD

## 2019-06-09 NOTE — Assessment & Plan Note (Signed)
BMET 

## 2019-06-09 NOTE — Addendum Note (Signed)
Addended by: Cresenciano Lick on: 06/09/2019 10:18 AM   Modules accepted: Orders

## 2019-06-26 DIAGNOSIS — H5213 Myopia, bilateral: Secondary | ICD-10-CM | POA: Diagnosis not present

## 2019-08-26 ENCOUNTER — Telehealth: Payer: Self-pay | Admitting: Internal Medicine

## 2019-08-26 DIAGNOSIS — I10 Essential (primary) hypertension: Secondary | ICD-10-CM

## 2019-08-26 NOTE — Telephone Encounter (Signed)
New message:   Pt is calling to see if she should still be taking her potassium(over the counter). She states it was never clarified if she should continue it after the labs came back higher than the last time. She states she would also like to know when her next follow up appt should be. Please advise.

## 2019-08-27 NOTE — Telephone Encounter (Signed)
Is she taking potassium now? Check BMET. RTC 6 mo after her last OV Thx

## 2019-08-27 NOTE — Telephone Encounter (Signed)
Pt return call back gave her MD response. Pt states she ran out of the Potassium last week. Will come in next week to have Potassium check to see if she need to continue. Lab order entered.Marland KitchenJohny Chess

## 2019-08-27 NOTE — Telephone Encounter (Signed)
Called pt there was no answer LMOM RTC...lmb 

## 2019-08-28 NOTE — Telephone Encounter (Signed)
Noted thanks °

## 2019-09-01 ENCOUNTER — Other Ambulatory Visit: Payer: Medicare Other

## 2019-09-01 NOTE — Telephone Encounter (Signed)
New message:   Pt is calling and would like a return call. Pt has canceled her labs for this morning. Please advise.

## 2019-09-01 NOTE — Telephone Encounter (Signed)
Spoke to pt and she decided to have her labs done the week before her appt.

## 2019-09-11 ENCOUNTER — Other Ambulatory Visit: Payer: Medicare Other

## 2019-09-11 DIAGNOSIS — I10 Essential (primary) hypertension: Secondary | ICD-10-CM

## 2019-09-11 LAB — BASIC METABOLIC PANEL
BUN/Creatinine Ratio: 15 (calc) (ref 6–22)
BUN: 13 mg/dL (ref 7–25)
CO2: 32 mmol/L (ref 20–32)
Calcium: 9.7 mg/dL (ref 8.6–10.4)
Chloride: 101 mmol/L (ref 98–110)
Creat: 0.89 mg/dL — ABNORMAL HIGH (ref 0.60–0.88)
Glucose, Bld: 85 mg/dL (ref 65–99)
Potassium: 3.8 mmol/L (ref 3.5–5.3)
Sodium: 139 mmol/L (ref 135–146)

## 2019-09-15 ENCOUNTER — Encounter: Payer: Self-pay | Admitting: Internal Medicine

## 2019-09-15 ENCOUNTER — Other Ambulatory Visit: Payer: Self-pay

## 2019-09-15 ENCOUNTER — Ambulatory Visit: Payer: Medicare Other | Admitting: Internal Medicine

## 2019-09-15 DIAGNOSIS — E034 Atrophy of thyroid (acquired): Secondary | ICD-10-CM

## 2019-09-15 DIAGNOSIS — I10 Essential (primary) hypertension: Secondary | ICD-10-CM

## 2019-09-15 DIAGNOSIS — R944 Abnormal results of kidney function studies: Secondary | ICD-10-CM

## 2019-09-15 DIAGNOSIS — M545 Low back pain: Secondary | ICD-10-CM | POA: Diagnosis not present

## 2019-09-15 DIAGNOSIS — E785 Hyperlipidemia, unspecified: Secondary | ICD-10-CM | POA: Diagnosis not present

## 2019-09-15 DIAGNOSIS — G8929 Other chronic pain: Secondary | ICD-10-CM

## 2019-09-15 DIAGNOSIS — Z0000 Encounter for general adult medical examination without abnormal findings: Secondary | ICD-10-CM

## 2019-09-15 MED ORDER — SYNTHROID 75 MCG PO TABS: 75.0000 ug | ORAL_TABLET | Freq: Every day | ORAL | 3 refills | Status: DC

## 2019-09-15 NOTE — Assessment & Plan Note (Addendum)
Stable RTC 4 mo GFR 60

## 2019-09-15 NOTE — Patient Instructions (Signed)
Voltaren gel 

## 2019-09-15 NOTE — Assessment & Plan Note (Signed)
Levothroid 

## 2019-09-15 NOTE — Progress Notes (Signed)
Subjective:  Patient ID: Lindsay Maldonado, female    DOB: 09-19-1934  Age: 85 y.o. MRN: [id number]  CC: No chief complaint on file.   HPI Lindsay Maldonado presents for HTN, CRI, low K f/u  GFR 60 calculated  Outpatient Medications Prior to Visit  Medication Sig Dispense Refill  . benzonatate (TESSALON) 200 MG capsule Take 1 capsule (200 mg total) by mouth 3 (three) times daily as needed for cough. 60 capsule 1  . Cholecalciferol (VITAMIN D3) 50 MCG (2000 UT) capsule Take 1 capsule (2,000 Units total) by mouth daily. 100 capsule 3  . Coenzyme Q10 (CO Q-10 PO) Take by mouth.    . fluticasone (FLONASE) 50 MCG/ACT nasal spray Place 2 sprays into both nostrils daily. 16 g 6  . GARLIC PO Take by mouth.    . loratadine (CLARITIN) 10 MG tablet Take 1 tablet (10 mg total) by mouth daily. 90 tablet 3  . LORazepam (ATIVAN) 1 MG tablet Take 1 tablet (1 mg total) by mouth 2 (two) times daily as needed for anxiety or sleep. 60 tablet 5  . mometasone (ELOCON) 0.1 % cream Apply 1 application topically 2 (two) times daily as needed. 45 g 0  . SYNTHROID 75 MCG tablet Take 1 tablet (75 mcg total) by mouth daily. 90 tablet 3  . triamterene-hydrochlorothiazide (MAXZIDE-25) 37.5-25 MG tablet Take 1 tablet by mouth daily. 90 tablet 3  . TURMERIC PO Take by mouth.     No facility-administered medications prior to visit.    ROS: Review of Systems  Constitutional: Negative for activity change, appetite change, chills, fatigue and unexpected weight change.  HENT: Negative for congestion, mouth sores and sinus pressure.   Eyes: Negative for visual disturbance.  Respiratory: Negative for cough and chest tightness.   Gastrointestinal: Negative for abdominal pain and nausea.  Genitourinary: Negative for difficulty urinating, frequency and vaginal pain.  Musculoskeletal: Negative for back pain and gait problem.  Skin: Negative for pallor and rash.  Neurological: Negative for dizziness, tremors, weakness,  numbness and headaches.  Psychiatric/Behavioral: Negative for confusion and sleep disturbance.    Objective:  BP (!) 120/62 (BP Location: Right Arm, Patient Position: Sitting, Cuff Size: Normal)   Pulse 98   Temp 98.4 F (36.9 C) (Oral)   Ht 5\' 3"  (1.6 m)   Wt 120 lb (54.4 kg)   SpO2 97%   BMI 21.26 kg/m   BP Readings from Last 3 Encounters:  09/15/19 (!) 120/62  06/09/19 (!) 146/74  03/10/19 140/82    Wt Readings from Last 3 Encounters:  09/15/19 120 lb (54.4 kg)  06/09/19 120 lb (54.4 kg)  03/10/19 118 lb (53.5 kg)    Physical Exam Constitutional:      General: She is not in acute distress.    Appearance: She is well-developed.  HENT:     Head: Normocephalic.     Right Ear: External ear normal.     Left Ear: External ear normal.     Nose: Nose normal.  Eyes:     General:        Right eye: No discharge.        Left eye: No discharge.     Conjunctiva/sclera: Conjunctivae normal.     Pupils: Pupils are equal, round, and reactive to light.  Neck:     Thyroid: No thyromegaly.     Vascular: No JVD.     Trachea: No tracheal deviation.  Cardiovascular:     Rate and Rhythm: Normal  rate and regular rhythm.     Heart sounds: Normal heart sounds.  Pulmonary:     Effort: No respiratory distress.     Breath sounds: No stridor. No wheezing.  Abdominal:     General: Bowel sounds are normal. There is no distension.     Palpations: Abdomen is soft. There is no mass.     Tenderness: There is no abdominal tenderness. There is no guarding or rebound.  Musculoskeletal:        General: Tenderness present.     Cervical back: Normal range of motion and neck supple.  Lymphadenopathy:     Cervical: No cervical adenopathy.  Skin:    Findings: No erythema or rash.  Neurological:     Cranial Nerves: No cranial nerve deficit.     Motor: No abnormal muscle tone.     Coordination: Coordination normal.     Deep Tendon Reflexes: Reflexes normal.  Psychiatric:        Behavior:  Behavior normal.        Thought Content: Thought content normal.        Judgment: Judgment normal.   heel is tender  Lab Results  Component Value Date   WBC 3.9 (L) 12/10/2018   HGB 13.4 12/10/2018   HCT 40.2 12/10/2018   PLT 200.0 12/10/2018   GLUCOSE 85 09/11/2019   CHOL 201 (H) 12/10/2018   TRIG 89.0 12/10/2018   HDL 89.40 12/10/2018   LDLCALC 94 12/10/2018   ALT 18 12/10/2018   AST 19 12/10/2018   NA 139 09/11/2019   K 3.8 09/11/2019   CL 101 09/11/2019   CREATININE 0.89 (H) 09/11/2019   BUN 13 09/11/2019   CO2 32 09/11/2019   TSH 2.59 12/10/2018    US Renal  Result Date: 01/30/2019 CLINICAL DATA:  Reduced GFR/renal function EXAM: RENAL / URINARY TRACT ULTRASOUND COMPLETE COMPARISON:  None. FINDINGS: Right Kidney: Renal measurements: 9.9 by 3.8 by 4.7 cm = volume: 92 mL . Echogenicity within normal limits. No hydronephrosis or discrete renal calculi observed. In the mid to lower kidney, an exophytic 0.9 by 0.8 by 0.8 cm lesion with internal echoes is observed for example on image 14/1 and image 21/1. This likely has enhanced through transmission and is most likely a complex cyst but a small solid mass is not readily excluded given the internal echoes. Left Kidney: Renal measurements: 9.6 by 5.3 by 4.4 cm = volume: 117 mL. In the left kidney lower pole, a 1.3 by 0.9 by 0.9 cm hypoechoic exophytic lesion with internal echoes is present. There is enhanced through transmission and this is most likely a complex cyst. In the left kidney upper pole, a 4.7 by 4.0 by 3.8 cm simple appearing exophytic cyst is present. Renal parenchymal echogenicity is normal. No discrete stones or hydronephrosis. Bladder: Appears normal for degree of bladder distention. Other: None. IMPRESSION: 1. Single bilateral complex renal lesions with internal echoes but enhanced through transmission favoring complex cysts, although strictly speaking, enhancement characteristics are not interrogated. If the patient has  hematuria or if otherwise clinically warranted, renal protocol MRI with and without contrast may be helpful for further characterization. 2. There is also a larger simple appearing cyst of the left kidney upper pole. Electronically Signed   By: Van Clines M.D.   On: 01/30/2019 17:28    Assessment & Plan:    No orders of the defined types were placed in this encounter.    Follow-up: No follow-ups on file.  Alex Marcelus Dubberly,  MD

## 2019-09-15 NOTE — Assessment & Plan Note (Signed)
Nl BP °

## 2019-10-27 ENCOUNTER — Other Ambulatory Visit: Payer: Self-pay | Admitting: Internal Medicine

## 2019-11-17 ENCOUNTER — Other Ambulatory Visit: Payer: Medicare Other

## 2019-11-18 DIAGNOSIS — H25043 Posterior subcapsular polar age-related cataract, bilateral: Secondary | ICD-10-CM | POA: Diagnosis not present

## 2019-11-18 DIAGNOSIS — H18413 Arcus senilis, bilateral: Secondary | ICD-10-CM | POA: Diagnosis not present

## 2019-11-18 DIAGNOSIS — H2513 Age-related nuclear cataract, bilateral: Secondary | ICD-10-CM | POA: Diagnosis not present

## 2019-11-18 DIAGNOSIS — H25013 Cortical age-related cataract, bilateral: Secondary | ICD-10-CM | POA: Diagnosis not present

## 2019-12-08 ENCOUNTER — Telehealth: Payer: Self-pay | Admitting: Internal Medicine

## 2019-12-08 DIAGNOSIS — Z289 Immunization not carried out for unspecified reason: Secondary | ICD-10-CM

## 2019-12-08 DIAGNOSIS — Z28311 Partially vaccinated for covid-19: Secondary | ICD-10-CM

## 2019-12-08 NOTE — Telephone Encounter (Signed)
Pls advise in absence of PCP../lmb 

## 2019-12-08 NOTE — Telephone Encounter (Signed)
    Patient requesting order for covid antibody testing She has never tested positive for COVID

## 2019-12-09 NOTE — Telephone Encounter (Signed)
I would recommend she discuss this with Dr. Alain Marion when he returns in a couple days.  Personally I do not think there is much to use in this test and it does not give [place] much information about immunity.

## 2019-12-11 NOTE — Telephone Encounter (Signed)
No need for the test - she is vaccinated. Thx

## 2019-12-12 NOTE — Telephone Encounter (Signed)
Notified pt w/MD response.Marland KitchenAndee Maldonado

## 2019-12-15 NOTE — Telephone Encounter (Signed)
Patient states she feels like she would like the antibody test because she does not want to get the booster covid shot if she already has antibodies for covid.

## 2019-12-16 NOTE — Telephone Encounter (Signed)
called pt no answer LMOM w/MD response. Can go to [facility] to have labs drawn.Marland KitchenJohny Maldonado

## 2019-12-16 NOTE — Addendum Note (Signed)
Addended by: Cassandria Anger on: 12/16/2019 01:06 AM   Modules accepted: Orders

## 2019-12-16 NOTE — Telephone Encounter (Signed)
I will order the test, however antibody test is irrelevant to the decision of getting a booster shot.  Thanks

## 2019-12-17 ENCOUNTER — Other Ambulatory Visit: Payer: Medicare Other

## 2019-12-17 DIAGNOSIS — Z289 Immunization not carried out for unspecified reason: Secondary | ICD-10-CM | POA: Diagnosis not present

## 2019-12-17 DIAGNOSIS — Z28311 Partially vaccinated for covid-19: Secondary | ICD-10-CM

## 2019-12-18 LAB — SARS-COV-2 ANTIBODY(IGG)SPIKE,SEMI-QUANTITATIVE: SARS COV1 AB(IGG)SPIKE,SEMI QN: 6.63 index — ABNORMAL HIGH (ref <1.00)

## 2020-01-12 DIAGNOSIS — H2511 Age-related nuclear cataract, right eye: Secondary | ICD-10-CM | POA: Diagnosis not present

## 2020-01-12 DIAGNOSIS — H2512 Age-related nuclear cataract, left eye: Secondary | ICD-10-CM | POA: Diagnosis not present

## 2020-01-12 DIAGNOSIS — Z961 Presence of intraocular lens: Secondary | ICD-10-CM | POA: Diagnosis not present

## 2020-01-13 DIAGNOSIS — H2512 Age-related nuclear cataract, left eye: Secondary | ICD-10-CM | POA: Diagnosis not present

## 2020-01-26 DIAGNOSIS — H2512 Age-related nuclear cataract, left eye: Secondary | ICD-10-CM | POA: Diagnosis not present

## 2020-01-26 DIAGNOSIS — H52202 Unspecified astigmatism, left eye: Secondary | ICD-10-CM | POA: Diagnosis not present

## 2020-01-26 DIAGNOSIS — Z961 Presence of intraocular lens: Secondary | ICD-10-CM | POA: Diagnosis not present

## 2020-02-02 ENCOUNTER — Other Ambulatory Visit: Payer: Self-pay | Admitting: Internal Medicine

## 2020-03-09 ENCOUNTER — Ambulatory Visit: Payer: Medicare Other

## 2020-03-15 ENCOUNTER — Ambulatory Visit (INDEPENDENT_AMBULATORY_CARE_PROVIDER_SITE_OTHER): Payer: Medicare Other

## 2020-03-15 DIAGNOSIS — Z0000 Encounter for general adult medical examination without abnormal findings: Secondary | ICD-10-CM

## 2020-03-15 NOTE — Patient Instructions (Signed)
Ms. Lindsay Maldonado , Thank you for taking time to come for your Medicare Wellness Visit. I appreciate your ongoing commitment to your health goals. Please review the following plan we discussed and let me know if I can assist you in the future.   Screening recommendations/referrals: Colonoscopy: no repeat due to age 86: last done 01/21/2020; due every 1-2 years Bone Density: discontinued for patient Recommended yearly ophthalmology/optometry visit for glaucoma screening and checkup Recommended yearly dental visit for hygiene and checkup  Vaccinations: Influenza vaccine: declined Pneumococcal vaccine: up to date Tdap vaccine: 10/13/2016; due every 10 years (due 2028) Shingles vaccine: never done; can check with local pharmacy or at your next provider appointment.   Covid-19: up to date  Advanced directives: Please bring a copy of your health care power of attorney and living will to the office at your convenience.  Conditions/risks identified: Yes; Reviewed health maintenance screenings with patient today and relevant education, vaccines, and/or referrals were provided. Please continue to do your personal lifestyle choices by: daily care of teeth and gums, regular physical activity (goal should be 5 days a week for 30 minutes), eat a healthy diet, avoid tobacco and drug use, limiting any alcohol intake, taking a low-dose aspirin (if not allergic or have been advised by your provider otherwise) and taking vitamins and minerals as recommended by your provider. Continue doing brain stimulating activities (puzzles, reading, adult coloring books, staying active) to keep memory sharp. Continue to eat heart healthy diet (full of fruits, vegetables, whole grains, lean protein, water--limit salt, fat, and sugar intake) and increase physical activity as tolerated.  Next appointment: Please schedule your next Medicare Wellness Visit with your Nurse Health Advisor in 1 year by calling  [phone].   Preventive Care 19 Years and Older, Female Preventive care refers to lifestyle choices and visits with your health care provider that can promote health and wellness. What does preventive care include?  A yearly physical exam. This is also called an annual well check.  Dental exams once or twice a year.  Routine eye exams. Ask your health care provider how often you should have your eyes checked.  Personal lifestyle choices, including:  Daily care of your teeth and gums.  Regular physical activity.  Eating a healthy diet.  Avoiding tobacco and drug use.  Limiting alcohol use.  Practicing safe sex.  Taking low-dose aspirin every day.  Taking vitamin and mineral supplements as recommended by your health care provider. What happens during an annual well check? The services and screenings done by your health care provider during your annual well check will depend on your age, overall health, lifestyle risk factors, and family history of disease. Counseling  Your health care provider may ask you questions about your:  Alcohol use.  Tobacco use.  Drug use.  Emotional well-being.  Home and relationship well-being.  Sexual activity.  Eating habits.  History of falls.  Memory and ability to understand (cognition).  Work and work Statistician.  Reproductive health. Screening  You may have the following tests or measurements:  Height, weight, and BMI.  Blood pressure.  Lipid and cholesterol levels. These may be checked every 5 years, or more frequently if you are over 68 years old.  Skin check.  Lung cancer screening. You may have this screening every year starting at age 53 if you have a 30-pack-year history of smoking and currently smoke or have quit within the past 15 years.  Fecal occult blood test (FOBT) of the stool. You may  have this test every year starting at age 18.  Flexible sigmoidoscopy or colonoscopy. You may have a  sigmoidoscopy every 5 years or a colonoscopy every 10 years starting at age 67.  Hepatitis C blood test.  Hepatitis B blood test.  Sexually transmitted disease (STD) testing.  Diabetes screening. This is done by checking your blood sugar (glucose) after you have not eaten for a while (fasting). You may have this done every 1-3 years.  Bone density scan. This is done to screen for osteoporosis. You may have this done starting at age 56.  Mammogram. This may be done every 1-2 years. Talk to your health care provider about how often you should have regular mammograms. Talk with your health care provider about your test results, treatment options, and if necessary, the need for more tests. Vaccines  Your health care provider may recommend certain vaccines, such as:  Influenza vaccine. This is recommended every year.  Tetanus, diphtheria, and acellular pertussis (Tdap, Td) vaccine. You may need a Td booster every 10 years.  Zoster vaccine. You may need this after age 31.  Pneumococcal 13-valent conjugate (PCV13) vaccine. One dose is recommended after age 12.  Pneumococcal polysaccharide (PPSV23) vaccine. One dose is recommended after age 37. Talk to your health care provider about which screenings and vaccines you need and how often you need them. This information is not intended to replace advice given to you by your health care provider. Make sure you discuss any questions you have with your health care provider. Document Released: 03/05/2015 Document Revised: 10/27/2015 Document Reviewed: 12/08/2014 Elsevier Interactive Patient Education  2017 [place] Prevention in the Home Falls can cause injuries. They can happen to people of all ages. There are many things you can do to make your home safe and to help prevent falls. What can I do on the outside of my home?  Regularly fix the edges of walkways and driveways and fix any cracks.  Remove anything that might make you  trip as you walk through a door, such as a raised step or threshold.  Trim any bushes or trees on the path to your home.  Use bright outdoor lighting.  Clear any walking paths of anything that might make someone trip, such as rocks or tools.  Regularly check to see if handrails are loose or broken. Make sure that both sides of any steps have handrails.  Any raised decks and porches should have guardrails on the edges.  Have any leaves, snow, or ice cleared regularly.  Use sand or salt on walking paths during winter.  Clean up any spills in your garage right away. This includes oil or grease spills. What can I do in the bathroom?  Use night lights.  Install grab bars by the toilet and in the tub and shower. Do not use towel bars as grab bars.  Use non-skid mats or decals in the tub or shower.  If you need to sit down in the shower, use a plastic, non-slip stool.  Keep the floor dry. Clean up any water that spills on the floor as soon as it happens.  Remove soap buildup in the tub or shower regularly.  Attach bath mats securely with double-sided non-slip rug tape.  Do not have throw rugs and other things on the floor that can make you trip. What can I do in the bedroom?  Use night lights.  Make sure that you have a light by your bed that is easy  to reach.  Do not use any sheets or blankets that are too big for your bed. They should not hang down onto the floor.  Have a firm chair that has side arms. You can use this for support while you get dressed.  Do not have throw rugs and other things on the floor that can make you trip. What can I do in the kitchen?  Clean up any spills right away.  Avoid walking on wet floors.  Keep items that you use a lot in easy-to-reach places.  If you need to reach something above you, use a strong step stool that has a grab bar.  Keep electrical cords out of the way.  Do not use floor polish or wax that makes floors slippery. If  you must use wax, use non-skid floor wax.  Do not have throw rugs and other things on the floor that can make you trip. What can I do with my stairs?  Do not leave any items on the stairs.  Make sure that there are handrails on both sides of the stairs and use them. Fix handrails that are broken or loose. Make sure that handrails are as long as the stairways.  Check any carpeting to make sure that it is firmly attached to the stairs. Fix any carpet that is loose or worn.  Avoid having throw rugs at the top or bottom of the stairs. If you do have throw rugs, attach them to the floor with carpet tape.  Make sure that you have a light switch at the top of the stairs and the bottom of the stairs. If you do not have them, ask someone to add them for you. What else can I do to help prevent falls?  Wear shoes that:  Do not have high heels.  Have rubber bottoms.  Are comfortable and fit you well.  Are closed at the toe. Do not wear sandals.  If you use a stepladder:  Make sure that it is fully opened. Do not climb a closed stepladder.  Make sure that both sides of the stepladder are locked into place.  Ask someone to hold it for you, if possible.  Clearly mark and make sure that you can see:  Any grab bars or handrails.  First and last steps.  Where the edge of each step is.  Use tools that help you move around (mobility aids) if they are needed. These include:  Canes.  Walkers.  Scooters.  Crutches.  Turn on the lights when you go into a dark area. Replace any light bulbs as soon as they burn out.  Set up your furniture so you have a clear path. Avoid moving your furniture around.  If any of your floors are uneven, fix them.  If there are any pets around you, be aware of where they are.  Review your medicines with your doctor. Some medicines can make you feel dizzy. This can increase your chance of falling. Ask your doctor what other things that you can do to  help prevent falls. This information is not intended to replace advice given to you by your health care provider. Make sure you discuss any questions you have with your health care provider. Document Released: 12/03/2008 Document Revised: 07/15/2015 Document Reviewed: 03/13/2014 Elsevier Interactive Patient Education  2017 [employer].

## 2020-03-15 NOTE — Progress Notes (Addendum)
I connected with Lindsay Maldonado today by telephone and verified that I am speaking with the correct person using two identifiers. Location patient: home Location provider: work Persons participating in the virtual visit: Cheniya Rybka and Lisette Abu, LPN.   I discussed the limitations, risks, security and privacy concerns of performing an evaluation and management service by telephone and the availability of in person appointments. I also discussed with the patient that there may be a patient responsible charge related to this service. The patient expressed understanding and verbally consented to this telephonic visit.    Interactive audio and video telecommunications were attempted between this provider and patient, however failed, due to patient having technical difficulties OR patient did not have access to video capability.  We continued and completed visit with audio only.  Some vital signs may be absent or patient reported.   Time Spent with patient on telephone encounter: 30 minutes  Subjective:   Lindsay Maldonado is a 86 y.o. female who presents for Medicare Annual (Subsequent) preventive examination.  Review of Systems    No ROS. Medicare Wellness Visit. Additional risk factors are reflected in social history. Cardiac Risk Factors include: advanced age (>38men, >76 women);hypertension Sleep Patterns: No sleep issues, feels rested on waking and sleeps 7 hours nightly. Home Safety/Smoke Alarms: Feels safe in home; uses home alarm. Smoke alarms in place. Living environment: 2-story home; Lives alone; no needs for DME; good support system. Seat Belt Safety/Bike Helmet: Wears seat belt.    Objective:    There were no vitals filed for this visit. There is no height or weight on file to calculate BMI.  Advanced Directives 03/15/2020 10/12/2015  Does Patient Have a Medical Advance Directive? Yes Yes  Type of Advance Directive Living will;Healthcare Power of Attorney  Living will  Does patient want to make changes to medical advance directive? No - Patient declined -  Copy of [place] in Chart? No - copy requested -    Current Medications (verified) Outpatient Encounter Medications as of 03/15/2020  Medication Sig   Cholecalciferol (VITAMIN D3) 50 MCG (2000 UT) capsule Take 1 capsule (2,000 Units total) by mouth daily.   LORazepam (ATIVAN) 1 MG tablet TAKE ONE TABLET BY MOUTH TWICE A DAY AS NEEDED FOR ANXIETY OR SLEEP   SYNTHROID 75 MCG tablet Take 1 tablet (75 mcg total) by mouth daily.   triamterene-hydrochlorothiazide (MAXZIDE-25) 37.5-25 MG tablet Take 1 tablet by mouth daily. Annual appt is overdue must see provider for future refills   benzonatate (TESSALON) 200 MG capsule Take 1 capsule (200 mg total) by mouth 3 (three) times daily as needed for cough. (Patient not taking: Reported on 03/15/2020)   Coenzyme Q10 (CO Q-10 PO) Take by mouth. (Patient not taking: Reported on 03/15/2020)   fluticasone (FLONASE) 50 MCG/ACT nasal spray Place 2 sprays into both nostrils daily. (Patient not taking: Reported on [id number])   GARLIC PO Take by mouth. (Patient not taking: Reported on 03/15/2020)   loratadine (CLARITIN) 10 MG tablet Take 1 tablet (10 mg total) by mouth daily. (Patient not taking: Reported on 03/15/2020)   mometasone (ELOCON) 0.1 % cream Apply 1 application topically 2 (two) times daily as needed. (Patient not taking: Reported on 03/15/2020)   TURMERIC PO Take by mouth. (Patient not taking: Reported on 03/15/2020)   No facility-administered encounter medications on file as of 03/15/2020.    Allergies (verified) Morphine   History: Past Medical History:  Diagnosis Date   Cyst of breast  Fibromyalgia    Goiter    multinodular   Hypothyroidism    Osteopenia    Past Surgical History:  Procedure Laterality Date   BREAST EXCISIONAL BIOPSY Left    benign   BREAST EXCISIONAL BIOPSY Right    benign   ENDOMETRIAL BIOPSY  2008    Dr. Loma Sousa CUFF REPAIR     Left   THYROID SURGERY  2007   Nodule removed   Family History  Problem Relation Age of Onset   Cancer Mother 39       breast   Parkinsonism Father    Social History   Socioeconomic History   Marital status: Widowed    Spouse name: Not on file   Number of children: 5   Years of education: Not on file   Highest education level: Not on file  Occupational History   Occupation: housewife  Tobacco Use   Smoking status: Never Smoker   Smokeless tobacco: Never Used  Substance and Sexual Activity   Alcohol use: No   Drug use: Not on file   Sexual activity: Not on file  Other Topics Concern   Not on file  Social History Narrative   Physician Roster:   Dr. Deatra Ina   Dr. Watt Climes   Social Determinants of Health   Financial Resource Strain: Low Risk    Difficulty of Paying Living Expenses: Not hard at all  Food Insecurity: No Food Insecurity   Worried About Charity fundraiser in the Last Year: Never true   [place] in the Last Year: Never true  Transportation Needs: No Transportation Needs   Lack of Transportation (Medical): No   Lack of Transportation (Non-Medical): No  Physical Activity: Inactive   Days of Exercise per Week: 0 days   Minutes of Exercise per Session: 0 min  Stress: No Stress Concern Present   Feeling of Stress : Not at all  Social Connections: Moderately Integrated   Frequency of Communication with Friends and Family: More than three times a week   Frequency of Social Gatherings with Friends and Family: Once a week   Attends Religious Services: More than 4 times per year   Active Member of [employer] or Organizations: Yes   Attends Archivist Meetings: More than 4 times per year   Marital Status: Widowed    Tobacco Counseling Counseling given: Not Answered   Clinical Intake:  Pre-visit preparation completed: Yes  Pain : No/denies pain     Nutritional Risks: None Diabetes: No  How often do  you need to have someone help you when you read instructions, pamphlets, or other written materials from your doctor or pharmacy?: 1 - Never What is the last grade level you completed in school?: High School Graduate  Diabetic? no  Interpreter Needed?: No  Information entered by :: Lisette Abu, LPN   Activities of Daily Living In your present state of health, do you have any difficulty performing the following activities: 03/15/2020  Hearing? N  Vision? N  Difficulty concentrating or making decisions? N  Walking or climbing stairs? N  Dressing or bathing? N  Doing errands, shopping? N  Preparing Food and eating ? N  Using the Toilet? N  In the past six months, have you accidently leaked urine? N  Do you have problems with loss of bowel control? N  Managing your Medications? N  Managing your Finances? N  Housekeeping or managing your Housekeeping? N  Some recent data might  be hidden    Patient Care Team: Plotnikov, Evie Lacks, MD as PCP - General Alden Hipp, MD (Obstetrics and Gynecology) Clarene Essex, MD (Gastroenterology)  Indicate any recent Medical Services you may have received from other than [facility] providers in the past year (date may be approximate).     Assessment:   This is a routine wellness examination for [place].  Hearing/Vision screen No exam data present  Dietary issues and exercise activities discussed: Current Exercise Habits: The patient does not participate in regular exercise at present, Exercise limited by: Other - see comments (plantar fasciitis)  Goals      Exercise 150 minutes per week (moderate activity)     Yoga of choice; or pilates     Patient Stated     To increase my walking and be more active.       Depression Screen PHQ 2/9 Scores 03/15/2020 01/14/2019 01/07/2018 10/13/2016 10/12/2015 10/12/2015 09/29/2014  PHQ - 2 Score 0 0 0 0 0 0 0    Fall Risk Fall Risk  03/15/2020 01/14/2019 01/07/2018 10/13/2016 10/12/2015  Falls in the  past year? 1 0 0 Yes No  Number falls in past yr: 0 - - 1 -  Injury with Fall? 0 - - No -  Follow up - Falls evaluation completed - - -    FALL RISK PREVENTION PERTAINING TO THE HOME:  Any stairs in or around the home? Yes  If so, are there any without handrails? No  Home free of loose throw rugs in walkways, pet beds, electrical cords, etc? Yes  Adequate lighting in your home to reduce risk of falls? Yes   ASSISTIVE DEVICES UTILIZED TO PREVENT FALLS:  Life alert? Yes  (has Alexa) Use of a cane, walker or w/c? No  Grab bars in the bathroom? No  Shower chair or bench in shower? No  Elevated toilet seat or a handicapped toilet? Yes   TIMED UP AND GO:  Was the test performed? No .  Length of time to ambulate 10 feet: 0 sec.   Gait steady and fast without use of assistive device  Cognitive Function: MMSE - Mini Mental State Exam 10/12/2015  Not completed: (No Data)        Immunizations Immunization History  Administered Date(s) Administered   Influenza-Unspecified 12/21/2017   Moderna Sars-Covid-2 Vaccination 03/04/2019, 04/01/2019   Pneumococcal Conjugate-13 09/25/2013   Pneumococcal Polysaccharide-23 01/09/2005, 10/12/2015   Td 02/21/2004   Tdap 10/13/2016   Zoster 04/29/2009    TDAP status: Up to date  Flu Vaccine status: Declined, Education has been provided regarding the importance of this vaccine but patient still declined. Advised may receive this vaccine at local pharmacy or Health Dept. Aware to provide a copy of the vaccination record if obtained from local pharmacy or Health Dept. Verbalized acceptance and understanding.  Pneumococcal vaccine status: Up to date  Covid-19 vaccine status: Completed vaccines  Qualifies for Shingles Vaccine? Yes   Zostavax completed Yes   Shingrix Completed?: No.    Education has been provided regarding the importance of this vaccine. Patient has been advised to call insurance company to determine out of pocket expense if  they have not yet received this vaccine. Advised may also receive vaccine at local pharmacy or Health Dept. Verbalized acceptance and understanding.  Screening Tests Health Maintenance  Topic Date Due   COVID-19 Vaccine (3 - Moderna risk 4-dose series) 04/29/2019   INFLUENZA VACCINE  09/21/2019   TETANUS/TDAP  10/14/2026   PNA vac Low  Risk Adult  Completed   DEXA SCAN  Discontinued    Health Maintenance  Health Maintenance Due  Topic Date Due   COVID-19 Vaccine (3 - Moderna risk 4-dose series) 04/29/2019   INFLUENZA VACCINE  09/21/2019    Colorectal cancer screening: No longer required.   Mammogram status: Completed 01/21/2019. Repeat every year  Bone density status: discontinued  Lung Cancer Screening: (Low Dose CT Chest recommended if Age 51-80 years, 30 pack-year currently smoking OR have quit w/in 15years.) does not qualify.   Lung Cancer Screening Referral: no  Additional Screening:  Hepatitis C Screening: does not qualify; Completed no  Vision Screening: Recommended annual ophthalmology exams for early detection of glaucoma and other disorders of the eye. Is the patient up to date with their annual eye exam?  Yes  Who is the provider or what is the name of the office in which the patient attends annual eye exams? Webb Laws, OD and Darleen Crocker, MD. If pt is not established with a provider, would they like to be referred to a provider to establish care? No .   Dental Screening: Recommended annual dental exams for proper oral hygiene  Community Resource Referral / Chronic Care Management: CRR required this visit?  No   CCM required this visit?  No      Plan:     I have personally reviewed and noted the following in the patient's chart:   Medical and social history Use of alcohol, tobacco or illicit drugs  Current medications and supplements Functional ability and status Nutritional status Physical activity Advanced directives List of other  physicians Hospitalizations, surgeries, and ER visits in previous 12 months Vitals Screenings to include cognitive, depression, and falls Referrals and appointments  In addition, I have reviewed and discussed with patient certain preventive protocols, quality metrics, and best practice recommendations. A written personalized care plan for preventive services as well as general preventive health recommendations were provided to patient.     Sheral Flow, LPN   6/96/2952   Nurse Notes:  Patient is cogitatively intact. There were no vitals filed for this visit. There is no height or weight on file to calculate BMI. Patient stated that she has no issues with gait or balance; does not use any assistive devices.   Medical screening examination/treatment/procedure(s) were performed by non-physician practitioner and as supervising physician I was immediately available for consultation/collaboration.  I agree with above. Lew Dawes, MD

## 2020-03-19 ENCOUNTER — Other Ambulatory Visit: Payer: Self-pay

## 2020-03-22 ENCOUNTER — Other Ambulatory Visit: Payer: Self-pay

## 2020-03-22 ENCOUNTER — Ambulatory Visit (INDEPENDENT_AMBULATORY_CARE_PROVIDER_SITE_OTHER): Payer: Medicare Other | Admitting: Internal Medicine

## 2020-03-22 ENCOUNTER — Encounter: Payer: Self-pay | Admitting: Internal Medicine

## 2020-03-22 VITALS — BP 122/80 | HR 81 | Temp 98.5°F | Ht 63.0 in | Wt 120.0 lb

## 2020-03-22 DIAGNOSIS — M545 Low back pain, unspecified: Secondary | ICD-10-CM

## 2020-03-22 DIAGNOSIS — Z1231 Encounter for screening mammogram for malignant neoplasm of breast: Secondary | ICD-10-CM

## 2020-03-22 DIAGNOSIS — E034 Atrophy of thyroid (acquired): Secondary | ICD-10-CM

## 2020-03-22 DIAGNOSIS — E785 Hyperlipidemia, unspecified: Secondary | ICD-10-CM

## 2020-03-22 DIAGNOSIS — I10 Essential (primary) hypertension: Secondary | ICD-10-CM | POA: Diagnosis not present

## 2020-03-22 DIAGNOSIS — E030 Congenital hypothyroidism with diffuse goiter: Secondary | ICD-10-CM

## 2020-03-22 DIAGNOSIS — Z0000 Encounter for general adult medical examination without abnormal findings: Secondary | ICD-10-CM | POA: Diagnosis not present

## 2020-03-22 DIAGNOSIS — R944 Abnormal results of kidney function studies: Secondary | ICD-10-CM

## 2020-03-22 DIAGNOSIS — G8929 Other chronic pain: Secondary | ICD-10-CM

## 2020-03-22 LAB — URINALYSIS
Bilirubin Urine: NEGATIVE
Hgb urine dipstick: NEGATIVE
Ketones, ur: NEGATIVE
Leukocytes,Ua: NEGATIVE
Nitrite: NEGATIVE
Specific Gravity, Urine: 1.01 (ref 1.000–1.030)
Total Protein, Urine: NEGATIVE
Urine Glucose: NEGATIVE
Urobilinogen, UA: 0.2 (ref 0.0–1.0)
pH: 7.5 (ref 5.0–8.0)

## 2020-03-22 LAB — COMPREHENSIVE METABOLIC PANEL
ALT: 17 U/L (ref 0–35)
AST: 17 U/L (ref 0–37)
Albumin: 4.1 g/dL (ref 3.5–5.2)
Alkaline Phosphatase: 64 U/L (ref 39–117)
BUN: 13 mg/dL (ref 6–23)
CO2: 33 mEq/L — ABNORMAL HIGH (ref 19–32)
Calcium: 9.7 mg/dL (ref 8.4–10.5)
Chloride: 101 mEq/L (ref 96–112)
Creatinine, Ser: 0.78 mg/dL (ref 0.40–1.20)
GFR: 70 mL/min (ref >60.00)
Glucose, Bld: 82 mg/dL (ref 70–99)
Potassium: 3.9 mEq/L (ref 3.5–5.1)
Sodium: 138 mEq/L (ref 135–145)
Total Bilirubin: 0.4 mg/dL (ref 0.2–1.2)
Total Protein: 6.6 g/dL (ref 6.0–8.3)

## 2020-03-22 LAB — LIPID PANEL
Cholesterol: 199 mg/dL (ref 0–200)
HDL: 94.9 mg/dL (ref >39.00)
LDL Cholesterol: 93 mg/dL (ref 0–99)
NonHDL: 103.69
Total CHOL/HDL Ratio: 2
Triglycerides: 53 mg/dL (ref 0.0–149.0)
VLDL: 10.6 mg/dL (ref 0.0–40.0)

## 2020-03-22 LAB — CBC WITH DIFFERENTIAL/PLATELET
Basophils Absolute: 0 10*3/uL (ref 0.0–0.1)
Basophils Relative: 0.5 % (ref 0.0–3.0)
Eosinophils Absolute: 0.2 10*3/uL (ref 0.0–0.7)
Eosinophils Relative: 4 % (ref 0.0–5.0)
HCT: 40.6 % (ref 36.0–46.0)
Hemoglobin: 13.4 g/dL (ref 12.0–15.0)
Lymphocytes Relative: 19.6 % (ref 12.0–46.0)
Lymphs Abs: 0.8 10*3/uL (ref 0.7–4.0)
MCHC: 33.1 g/dL (ref 30.0–36.0)
MCV: 88.6 fl (ref 78.0–100.0)
Monocytes Absolute: 0.3 10*3/uL (ref 0.1–1.0)
Monocytes Relative: 8.3 % (ref 3.0–12.0)
Neutro Abs: 2.8 10*3/uL (ref 1.4–7.7)
Neutrophils Relative %: 67.6 % (ref 43.0–77.0)
Platelets: 213 10*3/uL (ref 150.0–400.0)
RBC: 4.59 Mil/uL (ref 3.87–5.11)
RDW: 13.1 % (ref 11.5–15.5)
WBC: 4.1 10*3/uL (ref 4.0–10.5)

## 2020-03-22 LAB — TSH: TSH: 1.34 u[IU]/mL (ref 0.35–4.50)

## 2020-03-22 MED ORDER — LORAZEPAM 1 MG PO TABS
ORAL_TABLET | ORAL | 2 refills | Status: DC
Start: 2020-03-22 — End: 2020-09-27

## 2020-03-22 MED ORDER — TRIAMTERENE-HCTZ 37.5-25 MG PO TABS
0.5000 | ORAL_TABLET | Freq: Every day | ORAL | 3 refills | Status: DC
Start: 2020-03-22 — End: 2021-03-29

## 2020-03-22 MED ORDER — SYNTHROID 75 MCG PO TABS: 75.0000 ug | ORAL_TABLET | Freq: Every day | ORAL | 3 refills | Status: DC

## 2020-03-22 NOTE — Patient Instructions (Signed)
For a mild COVID-19 case you can take zinc 50 mg a day for 1 week, vitamin C 1000 mg daily for 1 week, vitamin D2 50,000 units weekly for 2 months (unless you are taking vitamin D daily already), Quercetin 500 mg twice a day for 1 week (if you can get it quick enough).  Maintain good oral hydration and take Tylenol with high fever. Feel better! Thanks  You can buy COVID-19 express tests for home use. They are inexpensive, roughly $15 for 2 tests.  There is a link below: [email or web address]  You can order 4 free COVID-19 home tests via [employer]  

## 2020-03-22 NOTE — Progress Notes (Signed)
Subjective:  Patient ID: Lindsay Maldonado, female    DOB: 1934/04/15  Age: 86 y.o. MRN: [id number]  CC: Annual Exam (Req refill on her Lorazepam, Triamterene, and Levothyroxine)   HPI Lindsay Maldonado presents for a well exam  Outpatient Medications Prior to Visit  Medication Sig Dispense Refill  . Cholecalciferol (VITAMIN D3) 50 MCG (2000 UT) capsule Take 1 capsule (2,000 Units total) by mouth daily. 100 capsule 3  . LORazepam (ATIVAN) 1 MG tablet TAKE ONE TABLET BY MOUTH TWICE A DAY AS NEEDED FOR ANXIETY OR SLEEP 60 tablet 2  . SYNTHROID 75 MCG tablet Take 1 tablet (75 mcg total) by mouth daily. 90 tablet 3  . triamterene-hydrochlorothiazide (MAXZIDE-25) 37.5-25 MG tablet Take 1 tablet by mouth daily. Annual appt is overdue must see provider for future refills 30 tablet 0  . TURMERIC PO Take by mouth.    . DUREZOL 0.05 % EMUL Apply to eye.    [name] PROLENSA 0.07 % SOLN Apply to eye.    . benzonatate (TESSALON) 200 MG capsule Take 1 capsule (200 mg total) by mouth 3 (three) times daily as needed for cough. (Patient not taking: No sig reported) 60 capsule 1  . Coenzyme Q10 (CO Q-10 PO) Take by mouth. (Patient not taking: No sig reported)    . fluticasone (FLONASE) 50 MCG/ACT nasal spray Place 2 sprays into both nostrils daily. (Patient not taking: No sig reported) 16 g 6  . GARLIC PO Take by mouth. (Patient not taking: Reported on 03/22/2020)    . loratadine (CLARITIN) 10 MG tablet Take 1 tablet (10 mg total) by mouth daily. (Patient not taking: No sig reported) 90 tablet 3  . mometasone (ELOCON) 0.1 % cream Apply 1 application topically 2 (two) times daily as needed. (Patient not taking: No sig reported) 45 g 0   No facility-administered medications prior to visit.    ROS: Review of Systems  Constitutional: Negative for activity change, appetite change, chills, fatigue and unexpected weight change.  HENT: Negative for congestion, mouth sores and sinus pressure.   Eyes: Negative for  visual disturbance.  Respiratory: Negative for cough and chest tightness.   Gastrointestinal: Negative for abdominal pain and nausea.  Genitourinary: Negative for difficulty urinating, frequency and vaginal pain.  Musculoskeletal: Negative for back pain and gait problem.  Skin: Negative for pallor and rash.  Neurological: Negative for dizziness, tremors, weakness, numbness and headaches.  Psychiatric/Behavioral: Negative for confusion and sleep disturbance.    Objective:  BP 122/80 (BP Location: Left Arm)   Pulse 81   Temp 98.5 F (36.9 C) (Oral)   Ht 5\' 3"  (1.6 m)   Wt 120 lb (54.4 kg)   SpO2 96%   BMI 21.26 kg/m   BP Readings from Last 3 Encounters:  03/22/20 122/80  09/15/19 (!) 120/62  06/09/19 (!) 146/74    Wt Readings from Last 3 Encounters:  03/22/20 120 lb (54.4 kg)  09/15/19 120 lb (54.4 kg)  06/09/19 120 lb (54.4 kg)    Physical Exam Constitutional:      General: She is not in acute distress.    Appearance: She is well-developed.  HENT:     Head: Normocephalic.     Right Ear: External ear normal.     Left Ear: External ear normal.     Nose: Nose normal.     Mouth/Throat:     Mouth: Oropharynx is clear and moist.  Eyes:     General:  Right eye: No discharge.        Left eye: No discharge.     Conjunctiva/sclera: Conjunctivae normal.     Pupils: Pupils are equal, round, and reactive to light.  Neck:     Thyroid: No thyromegaly.     Vascular: No JVD.     Trachea: No tracheal deviation.  Cardiovascular:     Rate and Rhythm: Normal rate and regular rhythm.     Heart sounds: Normal heart sounds.  Pulmonary:     Effort: No respiratory distress.     Breath sounds: No stridor. No wheezing.  Abdominal:     General: Bowel sounds are normal. There is no distension.     Palpations: Abdomen is soft. There is no mass.     Tenderness: There is no abdominal tenderness. There is no guarding or rebound.  Musculoskeletal:        General: No tenderness or  edema.     Cervical back: Normal range of motion and neck supple.  Lymphadenopathy:     Cervical: No cervical adenopathy.  Skin:    Findings: No erythema or rash.  Neurological:     Mental Status: She is oriented to person, place, and time.     Cranial Nerves: No cranial nerve deficit.     Motor: No abnormal muscle tone.     Coordination: Coordination normal.     Deep Tendon Reflexes: Reflexes normal.  Psychiatric:        Mood and Affect: Mood and affect normal.        Behavior: Behavior normal.        Thought Content: Thought content normal.        Judgment: Judgment normal.     Lab Results  Component Value Date   WBC 3.9 (L) 12/10/2018   HGB 13.4 12/10/2018   HCT 40.2 12/10/2018   PLT 200.0 12/10/2018   GLUCOSE 85 09/11/2019   CHOL 201 (H) 12/10/2018   TRIG 89.0 12/10/2018   HDL 89.40 12/10/2018   LDLCALC 94 12/10/2018   ALT 18 12/10/2018   AST 19 12/10/2018   NA 139 09/11/2019   K 3.8 09/11/2019   CL 101 09/11/2019   CREATININE 0.89 (H) 09/11/2019   BUN 13 09/11/2019   CO2 32 09/11/2019   TSH 2.59 12/10/2018    US Renal  Result Date: 01/30/2019 CLINICAL DATA:  Reduced GFR/renal function EXAM: RENAL / URINARY TRACT ULTRASOUND COMPLETE COMPARISON:  None. FINDINGS: Right Kidney: Renal measurements: 9.9 by 3.8 by 4.7 cm = volume: 92 mL . Echogenicity within normal limits. No hydronephrosis or discrete renal calculi observed. In the mid to lower kidney, an exophytic 0.9 by 0.8 by 0.8 cm lesion with internal echoes is observed for example on image 14/1 and image 21/1. This likely has enhanced through transmission and is most likely a complex cyst but a small solid mass is not readily excluded given the internal echoes. Left Kidney: Renal measurements: 9.6 by 5.3 by 4.4 cm = volume: 117 mL. In the left kidney lower pole, a 1.3 by 0.9 by 0.9 cm hypoechoic exophytic lesion with internal echoes is present. There is enhanced through transmission and this is most likely a complex  cyst. In the left kidney upper pole, a 4.7 by 4.0 by 3.8 cm simple appearing exophytic cyst is present. Renal parenchymal echogenicity is normal. No discrete stones or hydronephrosis. Bladder: Appears normal for degree of bladder distention. Other: None. IMPRESSION: 1. Single bilateral complex renal lesions with internal echoes but  enhanced through transmission favoring complex cysts, although strictly speaking, enhancement characteristics are not interrogated. If the patient has hematuria or if otherwise clinically warranted, renal protocol MRI with and without contrast may be helpful for further characterization. 2. There is also a larger simple appearing cyst of the left kidney upper pole. Electronically Signed   By: Van Clines M.D.   On: 01/30/2019 17:28    Assessment & Plan:   There are no diagnoses linked to this encounter.   No orders of the defined types were placed in this encounter.    Follow-up: No follow-ups on file.  Walker Kehr, MD

## 2020-03-22 NOTE — Assessment & Plan Note (Signed)
Maxzide was reduced

## 2020-03-22 NOTE — Assessment & Plan Note (Signed)
On Levothroid TSH 

## 2020-03-22 NOTE — Addendum Note (Signed)
Addended by: Raliegh Ip on: 03/22/2020 10:56 AM   Modules accepted: Orders

## 2020-03-22 NOTE — Assessment & Plan Note (Addendum)
Mild, nl BP at home Mild, nl BP at home Maxzide 1/2 tab

## 2020-05-12 ENCOUNTER — Ambulatory Visit
Admission: RE | Admit: 2020-05-12 | Discharge: 2020-05-12 | Disposition: A | Discharge status: Home / Self Care | Payer: Medicare Other | Source: Ambulatory Visit | Attending: Internal Medicine | Admitting: Internal Medicine

## 2020-05-12 ENCOUNTER — Other Ambulatory Visit: Payer: Self-pay

## 2020-05-12 DIAGNOSIS — Z1231 Encounter for screening mammogram for malignant neoplasm of breast: Secondary | ICD-10-CM | POA: Diagnosis not present

## 2020-06-16 NOTE — Telephone Encounter (Signed)
Patient called and was wondering if labs could be ordered for her electrolytes and thyroid. Please advise

## 2020-06-17 ENCOUNTER — Other Ambulatory Visit: Payer: Self-pay | Admitting: Internal Medicine

## 2020-06-17 ENCOUNTER — Other Ambulatory Visit (INDEPENDENT_AMBULATORY_CARE_PROVIDER_SITE_OTHER): Payer: Medicare Other

## 2020-06-17 DIAGNOSIS — E030 Congenital hypothyroidism with diffuse goiter: Secondary | ICD-10-CM

## 2020-06-17 DIAGNOSIS — R110 Nausea: Secondary | ICD-10-CM

## 2020-06-17 LAB — URINALYSIS
Bilirubin Urine: NEGATIVE
Hgb urine dipstick: NEGATIVE
Ketones, ur: NEGATIVE
Leukocytes,Ua: NEGATIVE
Nitrite: NEGATIVE
Specific Gravity, Urine: 1.015 (ref 1.000–1.030)
Total Protein, Urine: NEGATIVE
Urine Glucose: NEGATIVE
Urobilinogen, UA: 0.2 (ref 0.0–1.0)
pH: 8 (ref 5.0–8.0)

## 2020-06-17 LAB — COMPREHENSIVE METABOLIC PANEL
ALT: 14 U/L (ref 0–35)
AST: 16 U/L (ref 0–37)
Albumin: 4.1 g/dL (ref 3.5–5.2)
Alkaline Phosphatase: 60 U/L (ref 39–117)
BUN: 15 mg/dL (ref 6–23)
CO2: 30 mEq/L (ref 19–32)
Calcium: 9.4 mg/dL (ref 8.4–10.5)
Chloride: 99 mEq/L (ref 96–112)
Creatinine, Ser: 0.83 mg/dL (ref 0.40–1.20)
GFR: 64.86 mL/min (ref >60.00)
Glucose, Bld: 107 mg/dL — ABNORMAL HIGH (ref 70–99)
Potassium: 3.5 mEq/L (ref 3.5–5.1)
Sodium: 135 mEq/L (ref 135–145)
Total Bilirubin: 0.6 mg/dL (ref 0.2–1.2)
Total Protein: 6.8 g/dL (ref 6.0–8.3)

## 2020-06-17 LAB — T4, FREE: Free T4: 1.08 ng/dL (ref 0.60–1.60)

## 2020-06-17 LAB — TSH: TSH: 2.32 u[IU]/mL (ref 0.35–4.50)

## 2020-06-20 ENCOUNTER — Emergency Department (HOSPITAL_COMMUNITY)
Admission: EM | Admit: 2020-06-20 | Discharge: 2020-06-20 | Disposition: A | Discharge status: Home / Self Care | Payer: Medicare Other | Attending: Emergency Medicine | Admitting: Emergency Medicine

## 2020-06-20 ENCOUNTER — Other Ambulatory Visit: Payer: Self-pay

## 2020-06-20 ENCOUNTER — Encounter (HOSPITAL_COMMUNITY): Payer: Self-pay | Admitting: Emergency Medicine

## 2020-06-20 ENCOUNTER — Emergency Department (HOSPITAL_COMMUNITY): Payer: Medicare Other

## 2020-06-20 DIAGNOSIS — E039 Hypothyroidism, unspecified: Secondary | ICD-10-CM | POA: Diagnosis not present

## 2020-06-20 DIAGNOSIS — R5383 Other fatigue: Secondary | ICD-10-CM | POA: Diagnosis not present

## 2020-06-20 DIAGNOSIS — R5381 Other malaise: Secondary | ICD-10-CM | POA: Diagnosis not present

## 2020-06-20 DIAGNOSIS — R109 Unspecified abdominal pain: Secondary | ICD-10-CM | POA: Diagnosis not present

## 2020-06-20 DIAGNOSIS — I10 Essential (primary) hypertension: Secondary | ICD-10-CM | POA: Insufficient documentation

## 2020-06-20 DIAGNOSIS — R1013 Epigastric pain: Secondary | ICD-10-CM | POA: Diagnosis not present

## 2020-06-20 DIAGNOSIS — Z79899 Other long term (current) drug therapy: Secondary | ICD-10-CM | POA: Insufficient documentation

## 2020-06-20 LAB — URINALYSIS, ROUTINE W REFLEX MICROSCOPIC
Bilirubin Urine: NEGATIVE
Glucose, UA: NEGATIVE mg/dL
Hgb urine dipstick: NEGATIVE
Ketones, ur: NEGATIVE mg/dL
Leukocytes,Ua: NEGATIVE
Nitrite: NEGATIVE
Protein, ur: NEGATIVE mg/dL
Specific Gravity, Urine: 1.01 (ref 1.005–1.030)
pH: 8 (ref 5.0–8.0)

## 2020-06-20 LAB — COMPREHENSIVE METABOLIC PANEL
ALT: 18 U/L (ref 0–44)
AST: 20 U/L (ref 15–41)
Albumin: 3.8 g/dL (ref 3.5–5.0)
Alkaline Phosphatase: 64 U/L (ref 38–126)
Anion gap: 9 (ref 5–15)
BUN: 14 mg/dL (ref 8–23)
CO2: 28 mmol/L (ref 22–32)
Calcium: 9.3 mg/dL (ref 8.9–10.3)
Chloride: 100 mmol/L (ref 98–111)
Creatinine, Ser: 0.85 mg/dL (ref 0.44–1.00)
GFR, Estimated: >60 mL/min (ref >60)
Glucose, Bld: 103 mg/dL — ABNORMAL HIGH (ref 70–99)
Potassium: 3.6 mmol/L (ref 3.5–5.1)
Sodium: 137 mmol/L (ref 135–145)
Total Bilirubin: 0.3 mg/dL (ref 0.3–1.2)
Total Protein: 6.4 g/dL — ABNORMAL LOW (ref 6.5–8.1)

## 2020-06-20 LAB — CBC
HCT: 42.9 % (ref 36.0–46.0)
Hemoglobin: 13.8 g/dL (ref 12.0–15.0)
MCH: 29.6 pg (ref 26.0–34.0)
MCHC: 32.2 g/dL (ref 30.0–36.0)
MCV: 92.1 fL (ref 80.0–100.0)
Platelets: 209 10*3/uL (ref 150–400)
RBC: 4.66 MIL/uL (ref 3.87–5.11)
RDW: 12.7 % (ref 11.5–15.5)
WBC: 4.6 10*3/uL (ref 4.0–10.5)
nRBC: 0 % (ref 0.0–0.2)

## 2020-06-20 LAB — TROPONIN I (HIGH SENSITIVITY)
Troponin I (High Sensitivity): 3 ng/L (ref <18)
Troponin I (High Sensitivity): 3 ng/L (ref <18)

## 2020-06-20 LAB — LIPASE, BLOOD: Lipase: 51 U/L (ref 11–51)

## 2020-06-20 MED ORDER — FAMOTIDINE 20 MG PO TABS: 20.0000 mg | ORAL_TABLET | Freq: Every day | ORAL | 0 refills | Status: DC

## 2020-06-20 MED ORDER — ALUM & MAG HYDROXIDE-SIMETH 200-200-20 MG/5ML PO SUSP
30.0000 mL | Freq: Once | ORAL | Status: AC
  Administered 2020-06-20: 30 mL via ORAL
  Filled 2020-06-20: qty 30

## 2020-06-20 MED ORDER — ONDANSETRON HCL 4 MG PO TABS: 4.0000 mg | ORAL_TABLET | Freq: Three times a day (TID) | ORAL | 0 refills | Status: DC | PRN

## 2020-06-20 MED ORDER — SODIUM CHLORIDE 0.9 % IV BOLUS
500.0000 mL | Freq: Once | INTRAVENOUS | Status: AC
  Administered 2020-06-20: 500 mL via INTRAVENOUS

## 2020-06-20 NOTE — ED Notes (Signed)
Assisted patient to the bathroom. Pt ambulatory with steady gait.

## 2020-06-20 NOTE — Discharge Instructions (Addendum)

## 2020-06-20 NOTE — ED Notes (Signed)
RN reviewed discharge instructions w/ pt. Follow up, pain management, and prescriptions reviewed. Pt had no further questions. Ambulated independently w/ daughter

## 2020-06-20 NOTE — ED Notes (Signed)
Patient transported to X-ray 

## 2020-06-20 NOTE — ED Triage Notes (Signed)
C/o reflux during the night.  Reports upper abd pain since 4:45am with nausea and dry heaves.

## 2020-06-20 NOTE — ED Provider Notes (Signed)
[facility] EMERGENCY DEPARTMENT Provider Note   CSN: [id number] Arrival date & time: 06/20/20  0918     History Chief Complaint  Patient presents with  . upper abd pain    Lindsay Maldonado is a 86 y.o. female with a past medical history of hypertension and hypothyroidism secondary to thyroidectomy on chronic replacement.  She presents to the emergency department with a chief complaint of epigastric pain.  Patient states that she awoke from sleep around 4:00 in the morning with onset of epigastric pain radiating behind the sternum and up into her throat.  She states that it felt like acid reflux.  She does not generally have acid reflux.  She was unable to get to sleep.  Around 5 AM she began having severe sharp epigastric pain nonradiating with associated nausea and dry heaving lasting for approximately 2 hours.  After resolved she went to sleep, when she woke up she was feeling better but then pain returned.  She currently rates her pain a 1 out of 10.  She denies any active nausea, vomiting.  She does not use NSAIDs, alcohol she has no previous history of daily reflux, she has no cardiac history.  She denies any history of issues with her gallbladder.  She denies changes in stool color, frequency or consistency and denies diarrhea, constipation.  She had a recent GI illness about 3 weeks ago and is still not back to her normal and has been having persistent fatigue and malaise.  She has a past medical history of tubal ligation and no other abdominal surgeries.  HPI     Past Medical History:  Diagnosis Date  . Cyst of breast   . Fibromyalgia   . Goiter    multinodular  . Hypothyroidism   . Osteopenia     Patient Active Problem List   Diagnosis Date Noted  . Hypokalemia 06/09/2019  . Plantar fasciitis 06/09/2019  . Claustrophobia 03/10/2019  . Renal cyst 03/10/2019  . Decreased GFR 01/14/2019  . Seborrheic keratoses 01/14/2019  . Headache 11/13/2017  .  Bilateral hearing loss 11/13/2017  . Sinus pressure 11/13/2017  . Radiculitis of leg 09/26/2017  . Grief 10/13/2016  . Right LBP 09/29/2014  . Actinic keratoses 09/05/2012  . Chest wall pain 09/05/2012  . HTN (hypertension) 09/04/2011  . Elbow pain, left 05/12/2010  . Well adult exam 05/12/2010  . WEIGHT LOSS 04/26/2009  . Hypothyroidism 03/12/2007  . Cough 03/12/2007  . CERVICAL STRAIN 03/12/2007  . GOITER, MULTINODULAR 11/29/2006  . Fibromyalgia 11/29/2006  . OSTEOPENIA 11/29/2006    Past Surgical History:  Procedure Laterality Date  . BREAST EXCISIONAL BIOPSY Left    benign  . BREAST EXCISIONAL BIOPSY Right    benign  . ENDOMETRIAL BIOPSY  2008   Dr. Deatra Ina  . ROTATOR CUFF REPAIR     Left  . THYROID SURGERY  2007   Nodule removed     OB History   No obstetric history on file.     Family History  Problem Relation Age of Onset  . Cancer Mother 71       breast  . Parkinsonism Father     Social History   Tobacco Use  . Smoking status: Never Smoker  . Smokeless tobacco: Never Used  Substance Use Topics  . Alcohol use: No    Home Medications Prior to Admission medications   Medication Sig Start Date End Date Taking? Authorizing Provider  famotidine (PEPCID) 20 MG tablet Take 1  tablet (20 mg total) by mouth daily. 06/20/20  Yes Mahsa Hanser, PA-C  ondansetron (ZOFRAN) 4 MG tablet Take 1 tablet (4 mg total) by mouth every 8 (eight) hours as needed for nausea or vomiting. 06/20/20  Yes Margarita Mail, PA-C  Cholecalciferol (VITAMIN D3) 50 MCG (2000 UT) capsule Take 1 capsule (2,000 Units total) by mouth daily. 01/14/19   Plotnikov, Evie Lacks, MD  DUREZOL 0.05 % EMUL Apply to eye. 01/20/20   [provider]  LORazepam (ATIVAN) 1 MG tablet TAKE ONE TABLET BY MOUTH TWICE A DAY AS NEEDED FOR ANXIETY OR SLEEP 03/22/20   Plotnikov, Evie Lacks, MD  PROLENSA 0.07 % SOLN Apply to eye. 01/22/20   [provider]  SYNTHROID 75 MCG tablet Take 1 tablet (75  mcg total) by mouth daily. 03/22/20   Plotnikov, Evie Lacks, MD  triamterene-hydrochlorothiazide (MAXZIDE-25) 37.5-25 MG tablet Take 0.5 tablets by mouth daily. Annual appt is overdue must see provider for future refills 03/22/20   Plotnikov, Evie Lacks, MD  TURMERIC PO Take by mouth.    [provider]    Allergies    Morphine  Review of Systems   Review of Systems Ten systems reviewed and are negative for acute change, except as noted in the HPI.   Physical Exam Updated Vital Signs BP (!) 155/75   Pulse 66   Temp 97.6 F (36.4 C) (Oral)   Resp 18   Ht 5\' 2"  (1.575 m)   Wt 53.5 kg   SpO2 97%   BMI 21.58 kg/m   Physical Exam Vitals and nursing note reviewed.  Constitutional:      General: She is not in acute distress.    Appearance: She is well-developed. She is not diaphoretic.  HENT:     Head: Normocephalic and atraumatic.  Eyes:     General: No scleral icterus.    Conjunctiva/sclera: Conjunctivae normal.  Cardiovascular:     Rate and Rhythm: Normal rate and regular rhythm.     Heart sounds: Normal heart sounds. No murmur heard. No friction rub. No gallop.   Pulmonary:     Effort: Pulmonary effort is normal. No respiratory distress.     Breath sounds: Normal breath sounds.  Abdominal:     General: Bowel sounds are normal. There is no distension.     Palpations: Abdomen is soft. There is no mass.     Tenderness: There is abdominal tenderness in the epigastric area. There is no guarding.     Comments: Minimal tenderness in the epigastrium   Musculoskeletal:     Cervical back: Normal range of motion.  Skin:    General: Skin is warm and dry.  Neurological:     Mental Status: She is alert and oriented to person, place, and time.  Psychiatric:        Behavior: Behavior normal.     ED Results / Procedures / Treatments   Labs (all labs ordered are listed, but only abnormal results are displayed) Labs Reviewed  COMPREHENSIVE METABOLIC PANEL - Abnormal;  Notable for the following components:      Result Value   Glucose, Bld 103 (*)    Total Protein 6.4 (*)    All other components within normal limits  LIPASE, BLOOD  CBC  URINALYSIS, ROUTINE W REFLEX MICROSCOPIC  TROPONIN I (HIGH SENSITIVITY)  TROPONIN I (HIGH SENSITIVITY)    EKG EKG Interpretation  Date/Time:  Sunday Jun 20 2020 09:40:10 EDT Ventricular Rate:  85 PR Interval:  168 QRS  Duration: 72 QT Interval:  378 QTC Calculation: 449 R Axis:   70 Text Interpretation: Normal sinus rhythm Nonspecific ST abnormality Abnormal ECG Confirmed by Madalyn Rob [phone]) on 06/20/2020 11:39:02 AM   Radiology DG ABD ACUTE 2+V W 1V CHEST  Result Date: 06/20/2020 CLINICAL DATA:  Epigastric pain. EXAM: DG ABDOMEN ACUTE WITH 1 VIEW CHEST COMPARISON:  None. FINDINGS: A few air-fluid levels are identified in nondilated loops of colon on upright imaging. No evidence of bowel obstruction. The abdomen, pelvis, and chest are otherwise normal. IMPRESSION: A few air-fluid levels are identified in nondilated loops of colon on upright imaging. This is nonspecific but could be seen in the setting of diarrhea. No evidence of bowel obstruction. No other abnormalities. Electronically Signed   By: Dorise Bullion III M.D   On: 06/20/2020 15:00    Procedures Procedures   Medications Ordered in ED Medications  alum & mag hydroxide-simeth (MAALOX/MYLANTA) 200-200-20 MG/5ML suspension 30 mL (30 mLs Oral Given 06/20/20 1238)  sodium chloride 0.9 % bolus 500 mL (0 mLs Intravenous Stopped 06/20/20 1239)    ED Course  I have reviewed the triage vital signs and the nursing notes.  Pertinent labs & imaging results that were available during my care of the patient were reviewed by me and considered in my medical decision making (see chart for details).    MDM Rules/Calculators/A&P                          EU:[id number] pain VS:  Vitals:   06/20/20 1400 06/20/20 1500 06/20/20 1515 06/20/20 1530  BP: 103/88  130/76 (!) 145/83 (!) 155/75  Pulse: 82 76 65 66  Resp: (!) 22 (!) 21 16 18   Temp:      TempSrc:      SpO2: 100% 93% 97% 97%  Weight:      Height:        VQ:MGQQPYP is gathered by patient  and emr. Previous records obtained and reviewed. DDX:The patient's complaint of epigastric pain involves an extensive number of diagnostic and treatment options, and is a complaint that carries with it a high risk of complications, morbidity, and potential mortality. Given the large differential diagnosis, medical decision making is of high complexity. Differential diagnosis of epigastric pain includes: Functional or nonulcer dyspepsia PUD, GERD, Gastritis, (NSAIDs, alcohol, stress, H. pylori, pernicious anemia), pancreatitis or pancreatic cancer, overeating indigestion (high-fat foods, coffee), drugs (aspirin, antibiotics (eg, macrolides, metronidazole), corticosteroids, digoxin, narcotics, theophylline), gastroparesis, lactose intolerance, malabsorption gastric cancer, parasitic infection, (Giardia, Strongyloides, Ascaris) cholelithiasis, choledocholithiasis, or cholangitis, ACS, pericarditis, pneumonia, abdominal hernia, pregnancy, intestinal ischemia, esophageal rupture, gastric volvulus, hepatitis.  Labs: I ordered reviewed and interpreted labs which included CBC, lipase, urine all within normal limits.  Troponin within normal limits x2, CMP with slightly elevated glucose of 103 of insignificant value, mildly low protein also of insignificant value. Imaging: I ordered and reviewed images which included acute abdominal series. I independently visualized and interpreted all imaging. There are no acute, significant findings on today's images. EKG: Normal sinus rhythm at a rate of 85 without evidence of acute ischemia Consults: MDM: Patient here with epigastric abdominal pain.  Patient has a benign abdomen.  No evidence of bowel obstruction, gastric volvulus, hiatal hernia.  She has no active vomiting or pain  here in the emergency department.  Patient be discharged with Pepcid and Zofran should she need Zofran.  If she is having recurrent symptoms she should come to the hospital  at the time of pain which I discussed with her and follow closely with her primary care physician.  She appears otherwise appropriate for discharge at this time.  I reviewed all labs and answered all questions to the best of my ability at bedside with her and her daughter-in-law. Patient disposition:The patient appears reasonably screened and/or stabilized for discharge and I doubt any other medical condition or other [facility] requiring further screening, evaluation, or treatment in the ED at this time prior to discharge. I have discussed lab and/or imaging findings with the patient and answered all questions/concerns to the best of my ability.I have discussed return precautions and OP follow up.    Final Clinical Impression(s) / ED Diagnoses Final diagnoses:  Epigastric pain    Rx / DC Orders ED Discharge Orders         Ordered    ondansetron (ZOFRAN) 4 MG tablet  Every 8 hours PRN        06/20/20 1540    famotidine (PEPCID) 20 MG tablet  Daily        06/20/20 1540           Margarita Mail, PA-C 06/20/20 1640    Lucrezia Starch, MD 06/21/20 [phone]

## 2020-06-21 ENCOUNTER — Telehealth: Payer: Self-pay | Admitting: Internal Medicine

## 2020-06-21 NOTE — Telephone Encounter (Signed)
[facility] FYI    Caller states her mother in law has severe pain below breast bone in center and indigestion. Had stomach virus 3-4 weeks ago. Since 5AM, pain much worse. No recent unusual foods. Pain has subsided currently-- 9/10 before.  Advised to got ED now.  * You need to be seen in the Emergency Department. BRING MEDICINES: * Bring a list of your current medicines when you go to the Emergency Department (ER). CALL EMS 911 IF: * Confusion occurs * Passes out or becomes too weak to stand * Severe difficulty breathing occurs. CARE ADVICE given per Abdominal Pain, Upper (Adult) guideline.  Patient understood and agreed.

## 2020-07-08 DIAGNOSIS — H5203 Hypermetropia, bilateral: Secondary | ICD-10-CM | POA: Diagnosis not present

## 2020-08-19 DIAGNOSIS — H526 Other disorders of refraction: Secondary | ICD-10-CM | POA: Diagnosis not present

## 2020-09-14 ENCOUNTER — Telehealth: Payer: Self-pay

## 2020-09-14 ENCOUNTER — Other Ambulatory Visit: Payer: Self-pay | Admitting: Internal Medicine

## 2020-09-14 DIAGNOSIS — E034 Atrophy of thyroid (acquired): Secondary | ICD-10-CM

## 2020-09-14 DIAGNOSIS — I10 Essential (primary) hypertension: Secondary | ICD-10-CM

## 2020-09-14 DIAGNOSIS — R944 Abnormal results of kidney function studies: Secondary | ICD-10-CM

## 2020-09-14 DIAGNOSIS — E030 Congenital hypothyroidism with diffuse goiter: Secondary | ICD-10-CM

## 2020-09-14 NOTE — Telephone Encounter (Signed)
pt has stated she would like her labs to ordered ahead of her August 5th apptmnt with Dr. Alain Marion.  *Pt asked that she be notified when the labs are ordered so she can have them done asap.

## 2020-09-14 NOTE — Progress Notes (Unsigned)
Ok thx.

## 2020-09-14 NOTE — Telephone Encounter (Signed)
Okay.  Done. thanks

## 2020-09-15 NOTE — Telephone Encounter (Signed)
Notified pt MD has entered labs. Pt is unsure what day she will come for labs. Inform pt she can go to the [facility] to have done.Marland KitchenJohny Chess

## 2020-09-24 ENCOUNTER — Other Ambulatory Visit (INDEPENDENT_AMBULATORY_CARE_PROVIDER_SITE_OTHER): Payer: Medicare Other

## 2020-09-24 DIAGNOSIS — E034 Atrophy of thyroid (acquired): Secondary | ICD-10-CM | POA: Diagnosis not present

## 2020-09-24 DIAGNOSIS — I10 Essential (primary) hypertension: Secondary | ICD-10-CM | POA: Diagnosis not present

## 2020-09-24 DIAGNOSIS — E030 Congenital hypothyroidism with diffuse goiter: Secondary | ICD-10-CM

## 2020-09-24 DIAGNOSIS — R944 Abnormal results of kidney function studies: Secondary | ICD-10-CM | POA: Diagnosis not present

## 2020-09-24 LAB — CBC WITH DIFFERENTIAL/PLATELET
Basophils Absolute: 0 10*3/uL (ref 0.0–0.1)
Basophils Relative: 0.3 % (ref 0.0–3.0)
Eosinophils Absolute: 0.2 10*3/uL (ref 0.0–0.7)
Eosinophils Relative: 4.3 % (ref 0.0–5.0)
HCT: 40.1 % (ref 36.0–46.0)
Hemoglobin: 13.4 g/dL (ref 12.0–15.0)
Lymphocytes Relative: 22.1 % (ref 12.0–46.0)
Lymphs Abs: 0.8 10*3/uL (ref 0.7–4.0)
MCHC: 33.4 g/dL (ref 30.0–36.0)
MCV: 88.6 fl (ref 78.0–100.0)
Monocytes Absolute: 0.3 10*3/uL (ref 0.1–1.0)
Monocytes Relative: 8.8 % (ref 3.0–12.0)
Neutro Abs: 2.5 10*3/uL (ref 1.4–7.7)
Neutrophils Relative %: 64.5 % (ref 43.0–77.0)
Platelets: 185 10*3/uL (ref 150.0–400.0)
RBC: 4.53 Mil/uL (ref 3.87–5.11)
RDW: 13.3 % (ref 11.5–15.5)
WBC: 3.8 10*3/uL — ABNORMAL LOW (ref 4.0–10.5)

## 2020-09-24 LAB — COMPREHENSIVE METABOLIC PANEL
ALT: 13 U/L (ref 0–35)
AST: 16 U/L (ref 0–37)
Albumin: 4 g/dL (ref 3.5–5.2)
Alkaline Phosphatase: 61 U/L (ref 39–117)
BUN: 13 mg/dL (ref 6–23)
CO2: 30 mEq/L (ref 19–32)
Calcium: 9.2 mg/dL (ref 8.4–10.5)
Chloride: 97 mEq/L (ref 96–112)
Creatinine, Ser: 0.8 mg/dL (ref 0.40–1.20)
GFR: 67.66 mL/min (ref >60.00)
Glucose, Bld: 90 mg/dL (ref 70–99)
Potassium: 3.8 mEq/L (ref 3.5–5.1)
Sodium: 133 mEq/L — ABNORMAL LOW (ref 135–145)
Total Bilirubin: 0.5 mg/dL (ref 0.2–1.2)
Total Protein: 6.5 g/dL (ref 6.0–8.3)

## 2020-09-24 LAB — URINALYSIS
Bilirubin Urine: NEGATIVE
Hgb urine dipstick: NEGATIVE
Ketones, ur: NEGATIVE
Leukocytes,Ua: NEGATIVE
Nitrite: NEGATIVE
Specific Gravity, Urine: 1.01 (ref 1.000–1.030)
Total Protein, Urine: NEGATIVE
Urine Glucose: NEGATIVE
Urobilinogen, UA: 0.2 (ref 0.0–1.0)
pH: 7.5 (ref 5.0–8.0)

## 2020-09-24 LAB — LIPID PANEL
Cholesterol: 184 mg/dL (ref 0–200)
HDL: 89.2 mg/dL (ref >39.00)
LDL Cholesterol: 82 mg/dL (ref 0–99)
NonHDL: 94.51
Total CHOL/HDL Ratio: 2
Triglycerides: 64 mg/dL (ref 0.0–149.0)
VLDL: 12.8 mg/dL (ref 0.0–40.0)

## 2020-09-24 LAB — TSH: TSH: 2.63 u[IU]/mL (ref 0.35–5.50)

## 2020-09-25 ENCOUNTER — Other Ambulatory Visit: Payer: Self-pay | Admitting: Internal Medicine

## 2020-09-27 ENCOUNTER — Ambulatory Visit (INDEPENDENT_AMBULATORY_CARE_PROVIDER_SITE_OTHER): Payer: Medicare Other | Admitting: Internal Medicine

## 2020-09-27 ENCOUNTER — Encounter: Payer: Self-pay | Admitting: Internal Medicine

## 2020-09-27 ENCOUNTER — Other Ambulatory Visit: Payer: Self-pay

## 2020-09-27 DIAGNOSIS — M542 Cervicalgia: Secondary | ICD-10-CM | POA: Diagnosis not present

## 2020-09-27 DIAGNOSIS — M5410 Radiculopathy, site unspecified: Secondary | ICD-10-CM | POA: Diagnosis not present

## 2020-09-27 DIAGNOSIS — G5702 Lesion of sciatic nerve, left lower limb: Secondary | ICD-10-CM | POA: Diagnosis not present

## 2020-09-27 MED ORDER — METHYLPREDNISOLONE 4 MG PO TBPK: ORAL_TABLET | ORAL | 0 refills | Status: DC

## 2020-09-27 MED ORDER — TIZANIDINE HCL 4 MG PO TABS: 2.0000 mg | ORAL_TABLET | Freq: Every day | ORAL | 0 refills | Status: DC

## 2020-09-27 MED ORDER — LORAZEPAM 1 MG PO TABS: ORAL_TABLET | ORAL | 2 refills | Status: DC

## 2020-09-27 NOTE — Assessment & Plan Note (Addendum)
Will prescribe Medrol pack She will use Tizanine at hs as needed We can start PT

## 2020-09-27 NOTE — Assessment & Plan Note (Signed)
Medrol pack Tizanine at hs We can start PT

## 2020-09-27 NOTE — Progress Notes (Signed)
Subjective:  Patient ID: Lindsay Maldonado, female    DOB: [birth year]  Age: 86 y.o. MRN: [id number]  CC: Follow-up (6 MONTH F/U)   HPI Lindsay Maldonado presents for L hip pain down L lat leg x 1 month.  Pain is severe at times.  Her friend, retired physical therapy, diagnosed her with piriformis syndrome and helped her with dry needling.  Outpatient Medications Prior to Visit  Medication Sig Dispense Refill   Cholecalciferol (VITAMIN D3) 50 MCG (2000 UT) capsule Take 1 capsule (2,000 Units total) by mouth daily. 100 capsule 3   LORazepam (ATIVAN) 1 MG tablet TAKE ONE TABLET BY MOUTH TWICE A DAY AS NEEDED FOR ANXIETY OR SLEEP 60 tablet 2   SYNTHROID 75 MCG tablet Take 1 tablet (75 mcg total) by mouth daily. 90 tablet 3   triamterene-hydrochlorothiazide (MAXZIDE-25) 37.5-25 MG tablet Take 0.5 tablets by mouth daily. Annual appt is overdue must see provider for future refills 45 tablet 3   TURMERIC PO Take by mouth.     DUREZOL 0.05 % EMUL Apply to eye. (Patient not taking: Reported on 09/27/2020)     famotidine (PEPCID) 20 MG tablet Take 1 tablet (20 mg total) by mouth daily. (Patient not taking: Reported on 09/27/2020) 10 tablet 0   ondansetron (ZOFRAN) 4 MG tablet Take 1 tablet (4 mg total) by mouth every 8 (eight) hours as needed for nausea or vomiting. (Patient not taking: Reported on 09/27/2020) 10 tablet 0   PROLENSA 0.07 % SOLN Apply to eye. (Patient not taking: Reported on 09/27/2020)     No facility-administered medications prior to visit.    ROS: Review of Systems  Constitutional:  Negative for activity change, appetite change, chills, fatigue and unexpected weight change.  HENT:  Negative for congestion, mouth sores and sinus pressure.   Eyes:  Negative for visual disturbance.  Respiratory:  Negative for cough and chest tightness.   Gastrointestinal:  Negative for abdominal pain and nausea.  Genitourinary:  Negative for difficulty urinating, frequency and vaginal pain.   Musculoskeletal:  Positive for back pain, gait problem, neck pain and neck stiffness.  Skin:  Negative for pallor and rash.  Neurological:  Negative for dizziness, tremors, weakness, numbness and headaches.  Psychiatric/Behavioral:  Negative for confusion and sleep disturbance.    Objective:  BP 128/68 (BP Location: Left Arm)   Pulse 65   Temp 98.5 F (36.9 C) (Oral)   Ht '5\' 3"'$  (1.6 m)   Wt 121 lb 6.4 oz (55.1 kg)   SpO2 96%   BMI 21.51 kg/m   BP Readings from Last 3 Encounters:  09/27/20 128/68  06/20/20 (!) 155/75  03/22/20 122/80    Wt Readings from Last 3 Encounters:  09/27/20 121 lb 6.4 oz (55.1 kg)  06/20/20 118 lb (53.5 kg)  03/22/20 120 lb (54.4 kg)    Physical Exam Constitutional:      General: She is not in acute distress.    Appearance: She is well-developed.  HENT:     Head: Normocephalic.     Right Ear: External ear normal.     Left Ear: External ear normal.     Nose: Nose normal.  Eyes:     General:        Right eye: No discharge.        Left eye: No discharge.     Conjunctiva/sclera: Conjunctivae normal.     Pupils: Pupils are equal, round, and reactive to light.  Neck:  Thyroid: No thyromegaly.     Vascular: No JVD.     Trachea: No tracheal deviation.  Cardiovascular:     Rate and Rhythm: Normal rate and regular rhythm.     Heart sounds: Normal heart sounds.  Pulmonary:     Effort: No respiratory distress.     Breath sounds: No stridor. No wheezing.  Abdominal:     General: Bowel sounds are normal. There is no distension.     Palpations: Abdomen is soft. There is no mass.     Tenderness: There is no abdominal tenderness. There is no guarding or rebound.  Musculoskeletal:        General: No tenderness.     Cervical back: Normal range of motion and neck supple. No rigidity.  Lymphadenopathy:     Cervical: No cervical adenopathy.  Skin:    Findings: No erythema or rash.  Neurological:     Cranial Nerves: No cranial nerve deficit.      Motor: No abnormal muscle tone.     Coordination: Coordination normal.     Deep Tendon Reflexes: Reflexes normal.  Psychiatric:        Behavior: Behavior normal.        Thought Content: Thought content normal.        Judgment: Judgment normal.   Strait leg elev +/- on the L Pain on L buttock palpation   Lab Results  Component Value Date   WBC 3.8 (L) 09/24/2020   HGB 13.4 09/24/2020   HCT 40.1 09/24/2020   PLT 185.0 09/24/2020   GLUCOSE 90 09/24/2020   CHOL 184 09/24/2020   TRIG 64.0 09/24/2020   HDL 89.20 09/24/2020   LDLCALC 82 09/24/2020   ALT 13 09/24/2020   AST 16 09/24/2020   NA 133 (L) 09/24/2020   K 3.8 09/24/2020   CL 97 09/24/2020   CREATININE 0.80 09/24/2020   BUN 13 09/24/2020   CO2 30 09/24/2020   TSH 2.63 09/24/2020    DG ABD ACUTE 2+V W 1V CHEST  Result Date: 06/20/2020 CLINICAL DATA:  Epigastric pain. EXAM: DG ABDOMEN ACUTE WITH 1 VIEW CHEST COMPARISON:  None. FINDINGS: A few air-fluid levels are identified in nondilated loops of colon on upright imaging. No evidence of bowel obstruction. The abdomen, pelvis, and chest are otherwise normal. IMPRESSION: A few air-fluid levels are identified in nondilated loops of colon on upright imaging. This is nonspecific but could be seen in the setting of diarrhea. No evidence of bowel obstruction. No other abnormalities. Electronically Signed   By: Dorise Bullion III M.D   On: 06/20/2020 15:00    Assessment & Plan:    Follow-up: No follow-ups on file.  Walker Kehr, MD

## 2020-09-27 NOTE — Patient Instructions (Signed)
Piriformis Syndrome  Piriformis syndrome is a condition that can cause pain and numbness in your buttocks and down the back of your leg. Piriformis syndrome happens when the small muscle that connects the base of your spine to your hip (piriformis muscle) presses on the nerve that runs down the back of your leg (sciatic nerve). The piriformis muscle helps your hip rotate and helps to bring your leg back and out. It also helps shift your weight to keep you stable while you are walking. The sciatic nerve runs under or through the piriformis muscle. Damage to the piriformis muscle can cause spasms that put pressure on the nerve below. This causes pain and discomfort while sitting and moving. The pain may feel as if it begins in the buttock and spreads (radiates) down your hip and thigh. What are the causes? This condition is caused by pressure on the sciatic nerve from the piriformis muscle. The piriformis muscle can get irritated with overuse, especially ifother hip muscles are weak and the piriformis muscle has to do extra work. Piriformis syndrome can also occur after an injury, like a fall onto yourbuttocks. What increases the risk? You are more likely to develop this condition if you: Are a woman. Sit for long periods of time. Are a cyclist. Have weak buttocks muscles (gluteal muscles). What are the signs or symptoms? Symptoms of this condition include: Pain, tingling, or numbness that starts in the buttock and runs down the back of your leg (sciatica). Pain in the groin or thigh area. Your symptoms may get worse: The longer you sit. When you walk, run, or climb stairs. When straining to have a bowel movement. How is this diagnosed? This condition is diagnosed based on your symptoms, medical history, and physical exam. During the exam, your health care provider may: Move your leg into different positions to check for pain. Press on the muscles of your hip and buttock to see if that  increases your symptoms. You may also have tests, including: Imaging tests such as X-rays, MRI, or ultrasound. Electromyogram (EMG). This test measures electrical signals sent by your nerves into the muscles. Nerve conduction study. This test measures how well electrical signals pass through your nerves. How is this treated? This condition may be treated by: Stopping all activities that cause pain or make your condition worse. Applying ice or using heat therapy. Taking medicines to reduce pain and swelling. Taking a muscle relaxer (muscle relaxant) to stop muscle spasms. Doing range-of-motion and strengthening exercises (physical therapy) as told by your health care provider. Massaging the area. Having acupuncture. Getting an injection of medicine in the piriformis muscle. Your health care provider will choose the medicine based on your condition. He or she may inject: An anti-inflammatory medicine (steroid) to reduce swelling. A numbing medicine (local anesthetic) to block the pain. Botulinum toxin. The toxin blocks nerve impulses to specific muscles to reduce muscle tension. In rare cases, you may need surgery to cut the muscle and release pressure onthe nerve if other treatments do not work. Follow these instructions at home: Activity Do not sit for long periods. Get up and walk around every 20 minutes or as often as told by your health care provider. When driving long distances, make sure to take frequent stops to get up and stretch. Use a cushion when you sit on hard surfaces. Do exercises as told by your health care provider. Return to your normal activities as told by your health care provider. Ask your health care provider  what activities are safe for you. Managing pain, stiffness, and swelling     If directed, apply heat to the affected area as often as told by your health care provider. Use the heat source that your health care provider recommends, such as a moist heat pack or  a heating pad. Place a towel between your skin and the heat source. Leave the heat on for 20-30 minutes. Remove the heat if your skin turns bright red. This is especially important if you are unable to feel pain, heat, or cold. You may have a greater risk of getting burned. If directed, put ice on the injured area. Put ice in a plastic bag. Place a towel between your skin and the bag. Leave the ice on for 20 minutes, 2-3 times a day. General instructions Take over-the-counter and prescription medicines only as told by your health care provider. Ask your health care provider if the medicine prescribed to you requires you to avoid driving or using heavy machinery. You may need to take actions to prevent or treat constipation, such as: Drink enough fluid to keep your urine pale yellow. Take over-the-counter or prescription medicines. Eat foods that are high in fiber, such as beans, whole grains, and fresh fruits and vegetables. Limit foods that are high in fat and processed sugars, such as fried or sweet foods. Keep all follow-up visits as told by your health care provider. This is important. How is this prevented? Do not sit for longer than 20 minutes at a time. When you sit, choose padded surfaces. Warm up and stretch before being active. Cool down and stretch after being active. Give your body time to rest between periods of activity. Make sure to use equipment that fits you. Maintain physical fitness, including: Strength. Flexibility. Contact a health care provider if: Your pain and stiffness continue or get worse. Your leg or hip becomes weak. You have changes in your bowel function or bladder function. Summary Piriformis syndrome is a condition that can cause pain, tingling, and numbness in your buttocks and down the back of your leg. You may try applying heat or ice to relieve the pain. Do not sit for long periods. Get up and walk around every 20 minutes or as often as told by  your health care provider. This information is not intended to replace advice given to you by your health care provider. Make sure you discuss any questions you have with your healthcare provider. Document Revised: 05/30/2018 Document Reviewed: 10/03/2017 Elsevier Patient Education  [place].

## 2020-09-27 NOTE — Assessment & Plan Note (Addendum)
New left piriformis syndrome Medrol Dosepak.  PT offered.  Massage, heat, ice Tizanidine at at bedtime

## 2020-11-12 ENCOUNTER — Telehealth: Payer: Self-pay | Admitting: Internal Medicine

## 2020-11-12 DIAGNOSIS — G5702 Lesion of sciatic nerve, left lower limb: Secondary | ICD-10-CM

## 2020-11-12 NOTE — Telephone Encounter (Signed)
Patient says she would like for provider to go ahead & submit referral for PT as they previously discussed at last visit  Patient also suggest she would like if possible to have something close to her home so she doesn't have to travel far  please follow up

## 2020-11-16 NOTE — Telephone Encounter (Signed)
Notified pt referral has been placed../lmb 

## 2020-11-16 NOTE — Telephone Encounter (Signed)
OK thx  

## 2020-11-18 ENCOUNTER — Ambulatory Visit: Payer: Medicare Other | Admitting: Internal Medicine

## 2020-11-22 ENCOUNTER — Other Ambulatory Visit: Payer: Self-pay

## 2020-11-22 ENCOUNTER — Ambulatory Visit: Payer: Medicare Other | Admitting: Dermatology

## 2020-11-22 ENCOUNTER — Encounter: Payer: Self-pay | Admitting: Dermatology

## 2020-11-22 DIAGNOSIS — D1801 Hemangioma of skin and subcutaneous tissue: Secondary | ICD-10-CM | POA: Diagnosis not present

## 2020-11-22 DIAGNOSIS — Z1283 Encounter for screening for malignant neoplasm of skin: Secondary | ICD-10-CM | POA: Diagnosis not present

## 2020-11-22 DIAGNOSIS — L821 Other seborrheic keratosis: Secondary | ICD-10-CM

## 2020-11-26 ENCOUNTER — Ambulatory Visit: Payer: Medicare Other | Attending: Internal Medicine | Admitting: Physical Therapy

## 2020-11-26 ENCOUNTER — Other Ambulatory Visit: Payer: Self-pay

## 2020-11-26 ENCOUNTER — Encounter: Payer: Self-pay | Admitting: Physical Therapy

## 2020-11-26 DIAGNOSIS — M25552 Pain in left hip: Secondary | ICD-10-CM | POA: Diagnosis not present

## 2020-11-26 DIAGNOSIS — M79605 Pain in left leg: Secondary | ICD-10-CM | POA: Insufficient documentation

## 2020-11-26 DIAGNOSIS — R262 Difficulty in walking, not elsewhere classified: Secondary | ICD-10-CM | POA: Diagnosis not present

## 2020-11-26 DIAGNOSIS — M25652 Stiffness of left hip, not elsewhere classified: Secondary | ICD-10-CM | POA: Insufficient documentation

## 2020-11-26 NOTE — Patient Instructions (Signed)
Access Code: [email or web address] URL: [email or web address] Date: 11/26/2020 Prepared by: Raeford Razor  Exercises Supine Piriformis Stretch with Foot on Ground - 1-2 x daily - 7 x weekly - 1 sets - 3 reps - 30 hold Seated Figure 4 Piriformis Stretch - 1-2 x daily - 7 x weekly - 1 sets - 3 reps - 30 hold Seated Hamstring Stretch - 1-2 x daily - 7 x weekly - 1 sets - 5 reps - 30 hold Clamshell - 1-2 x daily - 7 x weekly - 2 sets - 10 reps - 5 hold Sidelying Hip Abduction - 1-2 x daily - 7 x weekly - 2 sets - 10 reps - 5 hold

## 2020-11-26 NOTE — Therapy (Signed)
[place], [place], [address] Phone: [phone]   Fax:  [phone]  Physical Therapy Evaluation  Patient Details  Name: Lindsay Maldonado MRN: [id number] Date of Birth: 04/09/1934 Referring Provider (PT): Dr. Lew Dawes   Encounter Date: 11/26/2020   PT End of Session - 11/26/20 1122     Visit Number 1    Number of Visits 12    Date for PT Re-Evaluation 01/07/21    PT Start Time 1105    PT Stop Time 1150    PT Time Calculation (min) 45 min    Activity Tolerance Patient tolerated treatment well             Past Medical History:  Diagnosis Date   Cyst of breast    Fibromyalgia    Goiter    multinodular   Hypothyroidism    Osteopenia     Past Surgical History:  Procedure Laterality Date   BREAST EXCISIONAL BIOPSY Left    benign   BREAST EXCISIONAL BIOPSY Right    benign   ENDOMETRIAL BIOPSY  2008   Dr. Loma Sousa CUFF REPAIR     Left   THYROID SURGERY  2007   Nodule removed    There were no vitals filed for this visit.    Subjective Assessment - 11/26/20 1111     Subjective She was at a conference in July 2022 and had some  pain in L buttock.  She had some dry needling to that area and it did improve.  She then began to have increased LE pain (post thigh constant  and calf intermittently) .  She denies weakness, slight numbness in bilateral toes "funny".  Denies any real back pain .  She also complains of bilateral lateral neck pain and tightness which is daily and worse at the end of the day.  the pain in her hip does not limit her activity.  She is just not comfortable with her ADLs.    Limitations Walking;House hold activities   sleep   How long can you walk comfortably? 45-60 min pain can be 5/10 (goes up from 3/10)    Currently in Pain? Yes    Pain Score 4     Pain Location Leg    Pain Orientation Left;Posterior    Pain Descriptors / Indicators  Aching;Sore;Tightness    Pain Type Chronic pain    Pain Radiating Towards LLE    Pain Onset More than a month ago    Pain Frequency Constant    Aggravating Factors  walking, moving the wrong way    Pain Relieving Factors sitting, resting    Effect of Pain on Daily Activities limits her comfortw ith her activity    Multiple Pain Sites Yes    Pain Location Neck    Pain Orientation Right;Left;Posterior    Pain Descriptors / Indicators Aching;Tightness    Pain Type Chronic pain    Pain Radiating Towards shoulders    Pain Onset More than a month ago    Pain Frequency Intermittent    Aggravating Factors  PM    Pain Relieving Factors ice                [facility] PT Assessment - 11/26/20 0001       Assessment   Medical Diagnosis L piriformis syndrome    Referring Provider (PT) Dr. Tyrone Apple Plotnikov    Onset Date/Surgical Date --   July 2022   Prior  Therapy Yes, Rt sided back pain and sciatica      Precautions   Precautions None      Restrictions   Weight Bearing Restrictions No      Balance Screen   Has the patient fallen in the past 6 months No    Has the patient had a decrease in activity level because of a fear of falling?  No    Is the patient reluctant to leave their home because of a fear of falling?  No      Home Environment   Living Environment Private residence    Living Arrangements Alone    Type of [place]   townhome     Prior Function   Level of [place] Retired    Environmental health practitioner office work    Leisure large family, friends, church      Cognition   Overall Cognitive Status Within Functional Limits for tasks assessed      Observation/Other Assessments   Focus on Therapeutic Outcomes (FOTO)  69%      Sensation   Light Touch Appears Intact      Coordination   Gross Motor Movements are Fluid and Coordinated Not tested      Functional Tests   Functional tests Single leg stance      Single Leg Stance    Comments L trendelenburg      Posture/Postural Control   Posture/Postural Control Postural limitations    Postural Limitations Rounded Shoulders;Forward head;Increased thoracic kyphosis      Strength   Right Hip Flexion 4/5    Right Hip Extension 4+/5    Right Hip ABduction 4+/5    Left Hip Flexion 4/5    Left Hip Extension 3+/5    Left Hip ABduction 3/5    Right Knee Flexion 5/5    Right Knee Extension 5/5    Left Knee Flexion 4/5    Left Knee Extension 5/5    Right/Left Ankle --   5/5 ankle DF     Palpation   Palpation comment pain in L piriformis, post thigh, hamstring L side , none in lumbar      Ambulation/Gait   Gait Pattern Antalgic;Lateral hip instability                 Objective measurements completed on examination: See above findings.     PT Long Term Goals - 11/26/20 1311       PT LONG TERM GOAL #1   Title Pt will be I with HEP for hip and core    Time 6    Period Weeks    Status New    Target Date 01/07/21      PT LONG TERM GOAL #2   Title Pt will be able to walk for at least 20 min x 3 per week to begin to positively impact her health    Time 6    Period Weeks    Status New    Target Date 01/07/21      PT LONG TERM GOAL #3   Title Pt will be able to shop with pain in L hip < 2/10 for 1 hour    Baseline 5/10    Time 6    Period Weeks    Status New    Target Date 01/07/21      PT LONG TERM GOAL #4   Title Pt will be able to stand on LL without Trendelenburg to show  pelvic stability, support    Time 6    Period Weeks    Status New    Target Date 01/07/21      PT LONG TERM GOAL #5   Title TBA further goals for neck once evaluated                    Plan - 11/26/20 1141     Clinical Impression Statement Pt presents for mod complexity evaluation for L hip which has become more radicular in nature.  Initially was localized to L piriformis, now she has L hamstring pain and some into her calf.  She has had sciatica before  and that is not what she feels in her LLE. She has weakness in L hip, affects her gait pattern and ability to do her normal ADLs without pain .  She also complains of neck pain which she would like to have addressed.    Personal Factors and Comorbidities Age;Comorbidity 1;Comorbidity 2    Comorbidities fibromyalgia, osteopenia    Examination-Activity Limitations Squat;Locomotion Level;Sleep;Lift;Bend    Examination-Participation Restrictions Interpersonal Relationship;Cleaning;Laundry;Community Activity;School;Meal Prep;Shop    Stability/Clinical Decision Making Evolving/Moderate complexity    Clinical Decision Making Moderate    Rehab Potential Excellent    PT Frequency 2x / week    PT Duration 6 weeks    PT Treatment/Interventions ADLs/Self Care Home Management;Therapeutic exercise;Patient/family education;Balance training;Therapeutic activities;Manual techniques;Dry needling;Functional mobility training;Cryotherapy;Electrical Stimulation;Moist Heat;Gait training    PT Next Visit Plan Tennis ball check HEP, manual L hip, eval neck.    PT Home Exercise Plan Access Code: [email or web address]    Consulted and Agree with Plan of Care Patient             Patient will benefit from skilled therapeutic intervention in order to improve the following deficits and impairments:  Abnormal gait, Decreased strength, Increased fascial restricitons, Impaired flexibility, Impaired UE functional use, Postural dysfunction, Pain, Decreased range of motion, Difficulty walking  Visit Diagnosis: Pain in left hip  Pain in left leg  Difficulty in walking, not elsewhere classified  Stiffness of left hip, not elsewhere classified     Problem List Patient Active Problem List   Diagnosis Date Noted   Piriformis syndrome of left side 09/27/2020   Cervical pain (neck) 09/27/2020   Hypokalemia 06/09/2019   Plantar fasciitis 06/09/2019   Claustrophobia 03/10/2019   Renal cyst 03/10/2019   Decreased GFR 01/14/2019    Seborrheic keratoses 01/14/2019   Headache 11/13/2017   Bilateral hearing loss 11/13/2017   Sinus pressure 11/13/2017   Radiculitis of leg 09/26/2017   Grief 10/13/2016   Right LBP 09/29/2014   Actinic keratoses 09/05/2012   Chest wall pain 09/05/2012   HTN (hypertension) 09/04/2011   Elbow pain, left 05/12/2010   Well adult exam 05/12/2010   WEIGHT LOSS 04/26/2009   Hypothyroidism 03/12/2007   Cough 03/12/2007   CERVICAL STRAIN 03/12/2007   GOITER, MULTINODULAR 11/29/2006   Fibromyalgia 11/29/2006   OSTEOPENIA 11/29/2006    Isadora Delorey, PT 11/26/2020, 1:27 PM  [facility] [address] [place], [place], [address] Phone: [phone]   Fax:  [phone]  Name: WAYNE BRUNKER MRN: [id number] Date of Birth: 04/15/1934  Raeford Razor, PT 11/26/20 1:27 PM Phone: [phone] Fax: [phone]

## 2020-11-29 ENCOUNTER — Other Ambulatory Visit: Payer: Self-pay

## 2020-11-29 ENCOUNTER — Encounter: Payer: Self-pay | Admitting: Physical Therapy

## 2020-11-29 ENCOUNTER — Ambulatory Visit: Payer: Medicare Other | Admitting: Physical Therapy

## 2020-11-29 DIAGNOSIS — M79605 Pain in left leg: Secondary | ICD-10-CM

## 2020-11-29 DIAGNOSIS — R262 Difficulty in walking, not elsewhere classified: Secondary | ICD-10-CM

## 2020-11-29 DIAGNOSIS — M25652 Stiffness of left hip, not elsewhere classified: Secondary | ICD-10-CM | POA: Diagnosis not present

## 2020-11-29 DIAGNOSIS — M25552 Pain in left hip: Secondary | ICD-10-CM

## 2020-11-29 NOTE — Therapy (Signed)
[place], [place], [address] Phone: [phone]   Fax:  [phone]  Physical Therapy Treatment  Patient Details  Name: Lindsay Maldonado MRN: [id number] Date of Birth: Nov 27, 1934 Referring Provider (PT): Dr. Lew Dawes   Encounter Date: 11/29/2020   PT End of Session - 11/29/20 0809     Visit Number 2    Number of Visits 12    Date for PT Re-Evaluation 01/07/21    Authorization Type BCBS    PT Start Time 0806    PT Stop Time 0845    PT Time Calculation (min) 39 min    Activity Tolerance Patient tolerated treatment well    Behavior During Therapy [facility] for tasks assessed/performed             Past Medical History:  Diagnosis Date   Cyst of breast    Fibromyalgia    Goiter    multinodular   Hypothyroidism    Osteopenia     Past Surgical History:  Procedure Laterality Date   BREAST EXCISIONAL BIOPSY Left    benign   BREAST EXCISIONAL BIOPSY Right    benign   ENDOMETRIAL BIOPSY  2008   Dr. Loma Sousa CUFF REPAIR     Left   THYROID SURGERY  2007   Nodule removed    There were no vitals filed for this visit.   Subjective Assessment - 11/29/20 0808     Subjective It felt better after the evaluation.  Sat. it was excruciating , took some Ativan to help me relax. It still feels relaxed from yesterday.             Skilled therapy interventions:   Therapeutic Exercise: Nustep L5 UE and LE  Supine hamstring x 3  with strap added ITB x 2  Supine figure 4 knee press x 5 , then pull x 2  Bridging with ball x  10, then with green band x 10  Sidelying hip abduction 2 x 10 , green band  Sidelying clam x 2 x 10 , green band  Seated piriformis   Manual Therapy: STW to L piriformis in prone : L hip PROM with compression to glutes, pin and stretch to L hamstring   Self care/Pt. Education:   tennis ball for L hip muscle release , used wall         PT Long Term  Goals - 11/26/20 1311       PT LONG TERM GOAL #1   Title Pt will be I with HEP for hip and core    Time 6    Period Weeks    Status New    Target Date 01/07/21      PT LONG TERM GOAL #2   Title Pt will be able to walk for at least 20 min x 3 per week to begin to positively impact her health    Time 6    Period Weeks    Status New    Target Date 01/07/21      PT LONG TERM GOAL #3   Title Pt will be able to shop with pain in L hip < 2/10 for 1 hour    Baseline 5/10    Time 6    Period Weeks    Status New    Target Date 01/07/21      PT LONG TERM GOAL #4   Title Pt will be able to stand on LL without Trendelenburg  to show pelvic stability, support    Time 6    Period Weeks    Status New    Target Date 01/07/21      PT LONG TERM GOAL #5   Title TBA further goals for neck once evaluated                   Plan - 11/29/20 0816     Clinical Impression Statement Patient with very little pain today but likely still feeling effects of the medicine she took Sunday.  Added bridges for bilateral post hip strength.  Low tolerance on L side for banded clam. Cont strengthening, gait.  No cert signed to allow neck eval.    PT Treatment/Interventions ADLs/Self Care Home Management;Therapeutic exercise;Patient/family education;Balance training;Therapeutic activities;Manual techniques;Dry needling;Functional mobility training;Cryotherapy;Electrical Stimulation;Moist Heat;Gait training    PT Next Visit Plan hip and core strength, manual L hip, eval neck when signed.    PT Home Exercise Plan Access Code: [email or web address]    Consulted and Agree with Plan of Care Patient             Patient will benefit from skilled therapeutic intervention in order to improve the following deficits and impairments:  Abnormal gait, Decreased strength, Increased fascial restricitons, Impaired flexibility, Impaired UE functional use, Postural dysfunction, Pain, Decreased range of motion, Difficulty  walking  Visit Diagnosis: Pain in left hip  Pain in left leg  Difficulty in walking, not elsewhere classified  Stiffness of left hip, not elsewhere classified     Problem List Patient Active Problem List   Diagnosis Date Noted   Piriformis syndrome of left side 09/27/2020   Cervical pain (neck) 09/27/2020   Hypokalemia 06/09/2019   Plantar fasciitis 06/09/2019   Claustrophobia 03/10/2019   Renal cyst 03/10/2019   Decreased GFR 01/14/2019   Seborrheic keratoses 01/14/2019   Headache 11/13/2017   Bilateral hearing loss 11/13/2017   Sinus pressure 11/13/2017   Radiculitis of leg 09/26/2017   Grief 10/13/2016   Right LBP 09/29/2014   Actinic keratoses 09/05/2012   Chest wall pain 09/05/2012   HTN (hypertension) 09/04/2011   Elbow pain, left 05/12/2010   Well adult exam 05/12/2010   WEIGHT LOSS 04/26/2009   Hypothyroidism 03/12/2007   Cough 03/12/2007   CERVICAL STRAIN 03/12/2007   GOITER, MULTINODULAR 11/29/2006   Fibromyalgia 11/29/2006   OSTEOPENIA 11/29/2006    Kyle Stansell, PT 11/29/2020, 8:54 AM  [facility] [address] [place], [place], [address] Phone: [phone]   Fax:  [phone]  Name: Lindsay Maldonado MRN: [id number] Date of Birth: [birth year]   Raeford Razor, PT 11/29/20 8:54 AM Phone: [phone] Fax: [phone]

## 2020-12-06 ENCOUNTER — Encounter: Payer: Self-pay | Admitting: Dermatology

## 2020-12-06 NOTE — Progress Notes (Signed)
   New Patient   Subjective  Lindsay Maldonado is a 86 y.o. female who presents for the following: Annual Exam (Patient here for skin check this is patient's first time seeing a dermatologist. Patient states that she has lesions on her back, under her breast, on her forehead and a lesion on the back of her left leg x years. No bleeding from any of the lesions they do get scaly. No personal history or family history of atypical moles, melanoma or non mole skin cancer. ).  General skin examination Location:  Duration:  Quality:  Associated Signs/Symptoms: Modifying Factors:  Severity:  Timing: Context:    The following portions of the chart were reviewed this encounter and updated as appropriate:  Tobacco  Allergies  Meds  Problems  Med Hx  Surg Hx  Fam Hx      Objective  Well appearing patient in no apparent distress; mood and affect are within normal limits. Left Inframammary Fold, Right Inframammary Fold, Right Inguinal Area, Right Lower Leg - Posterior Multiple tan-brown flattopped textured 4 to 9 mm papules  Abdomen (Lower Torso, Anterior) Multiple 1 to 2 mm dermal vascular red lesions   Torso - Posterior (Back) General skin examination.  No atypical nevi or signs of NMSC noted at the time of the visit.     A full examination was performed including scalp, head, eyes, ears, nose, lips, neck, chest, axillae, abdomen, back, buttocks, bilateral upper extremities, bilateral lower extremities, hands, feet, fingers, toes, fingernails, and toenails. All findings within normal limits unless otherwise noted below.  Areas beneath undergarments not fully examined.   Assessment & Plan  Seborrheic keratosis (4) Right Lower Leg - Posterior; Left Inframammary Fold; Right Inframammary Fold; Right Inguinal Area  Benign okay to leave if stable  Hemangioma of skin Abdomen (Lower Torso, Anterior)  No intervention indicated  Skin exam for malignant neoplasm Torso - Posterior  (Back)  Yearly skin check.

## 2020-12-09 ENCOUNTER — Encounter: Payer: Self-pay | Admitting: Physical Therapy

## 2020-12-09 ENCOUNTER — Ambulatory Visit: Payer: Medicare Other | Admitting: Physical Therapy

## 2020-12-09 ENCOUNTER — Other Ambulatory Visit: Payer: Self-pay

## 2020-12-09 DIAGNOSIS — M25652 Stiffness of left hip, not elsewhere classified: Secondary | ICD-10-CM

## 2020-12-09 DIAGNOSIS — R262 Difficulty in walking, not elsewhere classified: Secondary | ICD-10-CM | POA: Diagnosis not present

## 2020-12-09 DIAGNOSIS — M79605 Pain in left leg: Secondary | ICD-10-CM | POA: Diagnosis not present

## 2020-12-09 DIAGNOSIS — M25552 Pain in left hip: Secondary | ICD-10-CM

## 2020-12-09 NOTE — Patient Instructions (Signed)

## 2020-12-09 NOTE — Therapy (Signed)
[place], [place], [address] Phone: [phone]   Fax:  [phone]  Physical Therapy Treatment  Patient Details  Name: Lindsay Maldonado MRN: [id number] Date of Birth: Nov 12, 1934 Referring Provider (PT): Dr. Lew Dawes   Encounter Date: 12/09/2020   PT End of Session - 12/09/20 0813     Visit Number 3    Number of Visits 12    Date for PT Re-Evaluation 01/07/21    Authorization Type BCBS    PT Start Time 0809    PT Stop Time 0856    PT Time Calculation (min) 47 min    Activity Tolerance Patient tolerated treatment well    Behavior During Therapy [facility] for tasks assessed/performed             Past Medical History:  Diagnosis Date   Cyst of breast    Fibromyalgia    Goiter    multinodular   Hypothyroidism    Osteopenia     Past Surgical History:  Procedure Laterality Date   BREAST EXCISIONAL BIOPSY Left    benign   BREAST EXCISIONAL BIOPSY Right    benign   ENDOMETRIAL BIOPSY  2008   Dr. Loma Sousa CUFF REPAIR     Left   THYROID SURGERY  2007   Nodule removed    There were no vitals filed for this visit.   Subjective Assessment - 12/09/20 0811     Subjective I have not had the buttock pain in several days but I felt i this AM.  Side of my hip and knee hurt 4/10.  Neck is OK but hurt me last night. Overall her hip has been feeling better.    Currently in Pain? Yes    Pain Score 4     Pain Location Leg   hip to knee   Pain Orientation Left;Lateral;Posterior    Pain Descriptors / Indicators Aching    Pain Type Chronic pain    Pain Radiating Towards knee    Pain Onset More than a month ago    Pain Frequency Intermittent    Aggravating Factors  walking, not sure    Pain Relieving Factors sitting ,rest                Therapeutic Exercise: Nustep L5 UE and LE for 5 min  Seated piriformis x 3 painful  Seated hamstring x 3  LLE  Supine knees crossed rotate  to Rt x 15 sec and Lt x 5  Supine knee to opposite shoulder x 3 , Rt LE straight  Hip circles L thigh using hands  Bridging with with red band x 10 articulation Bridge hold with red band clam x 10  standing hip abduction 2 x 10 , no band   Single leg balance and glute actication on the wall  Reach with opp arm on wall   Manual  IASTM  to L glute med and L piriformis and lateral thigh, ITB   Ionto patch to L glute med 6 hour patch    cold pack  10 min L hip          PT Long Term Goals - 11/26/20 1311       PT LONG TERM GOAL #1   Title Pt will be I with HEP for hip and core    Time 6    Period Weeks    Status New    Target Date 01/07/21  PT LONG TERM GOAL #2   Title Pt will be able to walk for at least 20 min x 3 per week to begin to positively impact her health    Time 6    Period Weeks    Status New    Target Date 01/07/21      PT LONG TERM GOAL #3   Title Pt will be able to shop with pain in L hip < 2/10 for 1 hour    Baseline 5/10    Time 6    Period Weeks    Status New    Target Date 01/07/21      PT LONG TERM GOAL #4   Title Pt will be able to stand on LL without Trendelenburg to show pelvic stability, support    Time 6    Period Weeks    Status New    Target Date 01/07/21      PT LONG TERM GOAL #5   Title TBA further goals for neck once evaluated                   Plan - 12/09/20 0962     Clinical Impression Statement Patient with flare up of L hip pain today, in a slightly different location than previous.  Glute med more involved with 7/10 pain when attempting to stand on L LE with level pelvis.  Enocuraged to cont gentle stretching and mobilization of hip joint to promote healing.    PT Treatment/Interventions ADLs/Self Care Home Management;Therapeutic exercise;Patient/family education;Balance training;Therapeutic activities;Manual techniques;Dry needling;Functional mobility training;Cryotherapy;Electrical Stimulation;Moist Heat;Gait  training    PT Next Visit Plan did ionto help? hip and core strength, manual L hip, eval neck when signed.    PT Home Exercise Plan Access Code: [email or web address]    Consulted and Agree with Plan of Care Patient             Patient will benefit from skilled therapeutic intervention in order to improve the following deficits and impairments:  Abnormal gait, Decreased strength, Increased fascial restricitons, Impaired flexibility, Impaired UE functional use, Postural dysfunction, Pain, Decreased range of motion, Difficulty walking  Visit Diagnosis: Pain in left hip  Pain in left leg  Stiffness of left hip, not elsewhere classified  Difficulty in walking, not elsewhere classified     Problem List Patient Active Problem List   Diagnosis Date Noted   Piriformis syndrome of left side 09/27/2020   Cervical pain (neck) 09/27/2020   Hypokalemia 06/09/2019   Plantar fasciitis 06/09/2019   Claustrophobia 03/10/2019   Renal cyst 03/10/2019   Decreased GFR 01/14/2019   Seborrheic keratoses 01/14/2019   Headache 11/13/2017   Bilateral hearing loss 11/13/2017   Sinus pressure 11/13/2017   Radiculitis of leg 09/26/2017   Grief 10/13/2016   Right LBP 09/29/2014   Actinic keratoses 09/05/2012   Chest wall pain 09/05/2012   HTN (hypertension) 09/04/2011   Elbow pain, left 05/12/2010   Well adult exam 05/12/2010   WEIGHT LOSS 04/26/2009   Hypothyroidism 03/12/2007   Cough 03/12/2007   CERVICAL STRAIN 03/12/2007   GOITER, MULTINODULAR 11/29/2006   Fibromyalgia 11/29/2006   OSTEOPENIA 11/29/2006    Loneta Tamplin, PT 12/09/2020, 8:54 AM  [facility] [address] [place], [place], [address] Phone: [phone]   Fax:  [phone]  Name: Lindsay Maldonado MRN: [id number] Date of Birth: 1934/12/04  Raeford Razor, PT 12/09/20 8:55 AM Phone: [phone] Fax: [phone]

## 2020-12-13 ENCOUNTER — Other Ambulatory Visit: Payer: Self-pay

## 2020-12-13 ENCOUNTER — Encounter: Payer: Self-pay | Admitting: Physical Therapy

## 2020-12-13 ENCOUNTER — Ambulatory Visit: Payer: Medicare Other | Admitting: Physical Therapy

## 2020-12-13 DIAGNOSIS — M79605 Pain in left leg: Secondary | ICD-10-CM

## 2020-12-13 DIAGNOSIS — M25552 Pain in left hip: Secondary | ICD-10-CM | POA: Diagnosis not present

## 2020-12-13 DIAGNOSIS — M25652 Stiffness of left hip, not elsewhere classified: Secondary | ICD-10-CM | POA: Diagnosis not present

## 2020-12-13 DIAGNOSIS — R262 Difficulty in walking, not elsewhere classified: Secondary | ICD-10-CM

## 2020-12-13 NOTE — Therapy (Signed)
[place], [place], [address] Phone: [phone]   Fax:  [phone]  Physical Therapy Treatment  Patient Details  Name: Lindsay Maldonado MRN: [id number] Date of Birth: 01/28/1935 Referring Provider (PT): Dr. Lew Dawes   Encounter Date: 12/13/2020   PT End of Session - 12/13/20 1600     Visit Number 4    Number of Visits 12    Date for PT Re-Evaluation 01/07/21    Authorization Type BCBS    PT Start Time 5397    PT Stop Time 6734    PT Time Calculation (min) 43 min    Activity Tolerance Patient tolerated treatment well    Behavior During Therapy [facility] for tasks assessed/performed             Past Medical History:  Diagnosis Date   Cyst of breast    Fibromyalgia    Goiter    multinodular   Hypothyroidism    Osteopenia     Past Surgical History:  Procedure Laterality Date   BREAST EXCISIONAL BIOPSY Left    benign   BREAST EXCISIONAL BIOPSY Right    benign   ENDOMETRIAL BIOPSY  2008   Dr. Loma Sousa CUFF REPAIR     Left   THYROID SURGERY  2007   Nodule removed    There were no vitals filed for this visit.   Subjective Assessment - 12/13/20 1559     Subjective I had to take some Ibuprofen over the weekend and this AM it helps.  Pain still in L buttock and down lateral leg to knee.    Currently in Pain? Yes    Pain Score 3     Pain Location Leg    Pain Orientation Left;Posterior;Lateral    Pain Descriptors / Indicators Aching    Pain Type Chronic pain    Pain Onset More than a month ago    Pain Frequency Intermittent              Skilled therapy interventions:   Therapeutic Exercise:  Pilates Reformer used for LE/core strength, postural strength, lumbopelvic disassociation and core control.  Exercises included: Footwork 2 red 1 blue   Parallel on heels with ball   Parallel on toes with heel raise x 10  Hip External rotation x 15 narrow and then wide x  15   Single leg press 2 red 1 blue x 10  Bridging 2 red 1 blue   With Ball squeeze x 10  Single leg sidelying   Clam no band   Footwork 2 red parallel then turnout x 10 each (heels)    Ionto patch < 3 min to L piriformis              PT Long Term Goals - 11/26/20 1311       PT LONG TERM GOAL #1   Title Pt will be I with HEP for hip and core    Time 6    Period Weeks    Status New    Target Date 01/07/21      PT LONG TERM GOAL #2   Title Pt will be able to walk for at least 20 min x 3 per week to begin to positively impact her health    Time 6    Period Weeks    Status New    Target Date 01/07/21      PT LONG TERM GOAL #3   Title Pt  will be able to shop with pain in L hip < 2/10 for 1 hour    Baseline 5/10    Time 6    Period Weeks    Status New    Target Date 01/07/21      PT LONG TERM GOAL #4   Title Pt will be able to stand on LL without Trendelenburg to show pelvic stability, support    Time 6    Period Weeks    Status New    Target Date 01/07/21      PT LONG TERM GOAL #5   Title TBA further goals for neck once evaluated                   Plan - 12/13/20 1553     Clinical Impression Statement Patient cont to have L hip discomfort with walking and performing HEP.  She has had an increase in medial knee pain, likely due to compensations with gait.  She did well using the Pilates Reformer.  Advised her to continue with gentle consistent stretching and activity.  Notable weakness in L lateral hip and glute with single leg work.  Repeat of ionto patch .    PT Treatment/Interventions ADLs/Self Care Home Management;Therapeutic exercise;Patient/family education;Balance training;Therapeutic activities;Manual techniques;Dry needling;Functional mobility training;Cryotherapy;Electrical Stimulation;Moist Heat;Gait training    PT Next Visit Plan hip and core strength, manual L hip, eval neck when signed. Pilates, ionto?    PT Home Exercise Plan Access  Code: [email or web address]    Consulted and Agree with Plan of Care Patient             Patient will benefit from skilled therapeutic intervention in order to improve the following deficits and impairments:  Abnormal gait, Decreased strength, Increased fascial restricitons, Impaired flexibility, Impaired UE functional use, Postural dysfunction, Pain, Decreased range of motion, Difficulty walking  Visit Diagnosis: Pain in left hip  Pain in left leg  Stiffness of left hip, not elsewhere classified  Difficulty in walking, not elsewhere classified     Problem List Patient Active Problem List   Diagnosis Date Noted   Piriformis syndrome of left side 09/27/2020   Cervical pain (neck) 09/27/2020   Hypokalemia 06/09/2019   Plantar fasciitis 06/09/2019   Claustrophobia 03/10/2019   Renal cyst 03/10/2019   Decreased GFR 01/14/2019   Seborrheic keratoses 01/14/2019   Headache 11/13/2017   Bilateral hearing loss 11/13/2017   Sinus pressure 11/13/2017   Radiculitis of leg 09/26/2017   Grief 10/13/2016   Right LBP 09/29/2014   Actinic keratoses 09/05/2012   Chest wall pain 09/05/2012   HTN (hypertension) 09/04/2011   Elbow pain, left 05/12/2010   Well adult exam 05/12/2010   WEIGHT LOSS 04/26/2009   Hypothyroidism 03/12/2007   Cough 03/12/2007   CERVICAL STRAIN 03/12/2007   GOITER, MULTINODULAR 11/29/2006   Fibromyalgia 11/29/2006   OSTEOPENIA 11/29/2006    Axelle Szwed, PT 12/13/2020, 4:39 PM  [facility] [address] [place], [place], [address] Phone: [phone]   Fax:  [phone]  Name: Lindsay Maldonado MRN: [id number] Date of Birth: 22-Sep-1934  Raeford Razor, PT 12/13/20 4:39 PM Phone: [phone] Fax: [phone]

## 2020-12-15 ENCOUNTER — Encounter: Payer: Self-pay | Admitting: Physical Therapy

## 2020-12-15 ENCOUNTER — Ambulatory Visit: Payer: Medicare Other | Admitting: Physical Therapy

## 2020-12-15 ENCOUNTER — Other Ambulatory Visit: Payer: Self-pay

## 2020-12-15 DIAGNOSIS — M25552 Pain in left hip: Secondary | ICD-10-CM

## 2020-12-15 DIAGNOSIS — M25652 Stiffness of left hip, not elsewhere classified: Secondary | ICD-10-CM

## 2020-12-15 DIAGNOSIS — R262 Difficulty in walking, not elsewhere classified: Secondary | ICD-10-CM

## 2020-12-15 DIAGNOSIS — M79605 Pain in left leg: Secondary | ICD-10-CM

## 2020-12-15 NOTE — Therapy (Signed)
[place], [place], [address] Phone: [phone]   Fax:  [phone]  Physical Therapy Treatment  Patient Details  Name: Lindsay Maldonado MRN: [id number] Date of Birth: Feb 27, 1934 Referring Provider (PT): Dr. Lew Dawes   Encounter Date: 12/15/2020   PT End of Session - 12/15/20 1034     Visit Number 5    Number of Visits 12    Date for PT Re-Evaluation 01/07/21    Authorization Type BCBS    PT Start Time 1016    PT Stop Time 1100    PT Time Calculation (min) 44 min    Activity Tolerance Patient tolerated treatment well    Behavior During Therapy [facility] for tasks assessed/performed             Past Medical History:  Diagnosis Date   Cyst of breast    Fibromyalgia    Goiter    multinodular   Hypothyroidism    Osteopenia     Past Surgical History:  Procedure Laterality Date   BREAST EXCISIONAL BIOPSY Left    benign   BREAST EXCISIONAL BIOPSY Right    benign   ENDOMETRIAL BIOPSY  2008   Dr. Loma Sousa CUFF REPAIR     Left   THYROID SURGERY  2007   Nodule removed    There were no vitals filed for this visit.   Subjective Assessment - 12/15/20 1020     Subjective Stopped ibuprofen .  L medial knee and L lateral hip hurt (points to glute med) .  L post lateral neck .    Currently in Pain? Yes    Pain Score 2     Pain Location Leg   knee and lateral post hip   Pain Orientation Left    Pain Descriptors / Indicators Sore    Pain Type Chronic pain    Pain Onset More than a month ago    Pain Frequency Intermittent    Aggravating Factors  walking, standing    Pain Relieving Factors PM hours    Multiple Pain Sites Yes    Pain Score 1    Pain Location Neck    Pain Orientation Left;Posterior;Lateral    Pain Descriptors / Indicators Tightness    Pain Type Chronic pain    Pain Onset More than a month ago    Pain Frequency Intermittent    Aggravating Factors  PM    Pain  Relieving Factors during the day                [facility] PT Assessment - 12/15/20 0001       AROM   Cervical Flexion 48    Cervical Extension 60    Cervical - Right Side Bend 23   stretch   Cervical - Left Side Bend 32    Cervical - Right Rotation 75 no pain    Cervical - Left Rotation 50 pain on L               Skilled therapy interventions:   Therapeutic Exercise:  Cervical stretching : upper trap and levator scap x 3 , 30 sec each side   Supine narrow grip overhead  x 10 Clam with red band x 15   March x 10 red band   Supine isometric adduction to bridge with ball x 10   Bridge with clam x 10 each side    Knee to chest , figure 4 each side x  2-3   Hamstring stretch x 3, 30 sec then ITB x 30 sec    Single leg standing balance with UE assist as needed, L side much more limited than Rt   Hip hinge with hip extension with hands on chair.   Ionto patch L glute med  6 hour patch with 1 cc dexamethasone     PT Long Term Goals - 11/26/20 1311       PT LONG TERM GOAL #1   Title Pt will be I with HEP for hip and core    Time 6    Period Weeks    Status New    Target Date 01/07/21      PT LONG TERM GOAL #2   Title Pt will be able to walk for at least 20 min x 3 per week to begin to positively impact her health    Time 6    Period Weeks    Status New    Target Date 01/07/21      PT LONG TERM GOAL #3   Title Pt will be able to shop with pain in L hip < 2/10 for 1 hour    Baseline 5/10    Time 6    Period Weeks    Status New    Target Date 01/07/21      PT LONG TERM GOAL #4   Title Pt will be able to stand on LL without Trendelenburg to show pelvic stability, support    Time 6    Period Weeks    Status New    Target Date 01/07/21      PT LONG TERM GOAL #5   Title TBA further goals for neck once evaluated                   Plan - 12/15/20 1029     Clinical Impression Statement Patient improving overall but feeling more glute medius  and L medial knee discomfort.  Not sore in the pes anserine, more medial jt. line. Pain in single leg standing , with Trendelenburg L side.  Ionto patch #3. Did an eval of her neck pain.  Pt observed to have poor awareness with head posture in standing.    PT Treatment/Interventions ADLs/Self Care Home Management;Therapeutic exercise;Patient/family education;Balance training;Therapeutic activities;Manual techniques;Dry needling;Functional mobility training;Cryotherapy;Electrical Stimulation;Moist Heat;Gait training    PT Next Visit Plan save time for soft tissue. check cervical strethcing HEP .  hip and core strength, manual L hip,  Pilates, ionto?    PT Home Exercise Plan Access Code: [email or web address]    Consulted and Agree with Plan of Care Patient             Patient will benefit from skilled therapeutic intervention in order to improve the following deficits and impairments:  Abnormal gait, Decreased strength, Increased fascial restricitons, Impaired flexibility, Impaired UE functional use, Postural dysfunction, Pain, Decreased range of motion, Difficulty walking  Visit Diagnosis: Pain in left hip  Pain in left leg  Stiffness of left hip, not elsewhere classified  Difficulty in walking, not elsewhere classified     Problem List Patient Active Problem List   Diagnosis Date Noted   Piriformis syndrome of left side 09/27/2020   Cervical pain (neck) 09/27/2020   Hypokalemia 06/09/2019   Plantar fasciitis 06/09/2019   Claustrophobia 03/10/2019   Renal cyst 03/10/2019   Decreased GFR 01/14/2019   Seborrheic keratoses 01/14/2019   Headache 11/13/2017   Bilateral hearing loss 11/13/2017   Sinus pressure  11/13/2017   Radiculitis of leg 09/26/2017   Grief 10/13/2016   Right LBP 09/29/2014   Actinic keratoses 09/05/2012   Chest wall pain 09/05/2012   HTN (hypertension) 09/04/2011   Elbow pain, left 05/12/2010   Well adult exam 05/12/2010   WEIGHT LOSS 04/26/2009   Hypothyroidism  03/12/2007   Cough 03/12/2007   CERVICAL STRAIN 03/12/2007   GOITER, MULTINODULAR 11/29/2006   Fibromyalgia 11/29/2006   OSTEOPENIA 11/29/2006    Henrietta Cieslewicz, PT 12/15/2020, 11:50 AM  [facility] [address] [place], [place], [address] Phone: [phone]   Fax:  [phone]  Name: JAMERA VANLOAN MRN: [id number] Date of Birth: [birth year]   Raeford Razor, PT 12/15/20 11:51 AM Phone: [phone] Fax: [phone]

## 2020-12-20 ENCOUNTER — Ambulatory Visit: Payer: Medicare Other | Admitting: Physical Therapy

## 2020-12-20 ENCOUNTER — Other Ambulatory Visit: Payer: Self-pay

## 2020-12-20 DIAGNOSIS — M25552 Pain in left hip: Secondary | ICD-10-CM | POA: Diagnosis not present

## 2020-12-20 DIAGNOSIS — R262 Difficulty in walking, not elsewhere classified: Secondary | ICD-10-CM

## 2020-12-20 DIAGNOSIS — M79605 Pain in left leg: Secondary | ICD-10-CM | POA: Diagnosis not present

## 2020-12-20 DIAGNOSIS — M25652 Stiffness of left hip, not elsewhere classified: Secondary | ICD-10-CM | POA: Diagnosis not present

## 2020-12-20 NOTE — Therapy (Signed)
[place], [place], [address] Phone: [phone]   Fax:  [phone]  Physical Therapy Treatment  Patient Details  Name: Lindsay Maldonado MRN: [id number] Date of Birth: [birth year] Referring Provider (PT): Dr. Lew Dawes   Encounter Date: 12/20/2020   PT End of Session - 12/20/20 1548     Visit Number 6    Number of Visits 12    Date for PT Re-Evaluation 01/07/21    Authorization Type BCBS    PT Start Time 2774    PT Stop Time 1632    PT Time Calculation (min) 44 min             Past Medical History:  Diagnosis Date   Cyst of breast    Fibromyalgia    Goiter    multinodular   Hypothyroidism    Osteopenia     Past Surgical History:  Procedure Laterality Date   BREAST EXCISIONAL BIOPSY Left    benign   BREAST EXCISIONAL BIOPSY Right    benign   ENDOMETRIAL BIOPSY  2008   Dr. Loma Sousa CUFF REPAIR     Left   THYROID SURGERY  2007   Nodule removed    There were no vitals filed for this visit.   Subjective Assessment - 12/20/20 1555     Subjective The knee is what is bothering me.  Knee was a 2/10 earlier. Hip is a 1/10.    Currently in Pain? Yes    Pain Score 2     Pain Location Knee    Pain Orientation Medial;Left    Pain Descriptors / Indicators Aching;Sore    Pain Type Acute pain    Pain Onset 1 to 4 weeks ago    Pain Frequency Intermittent    Aggravating Factors  walking    Pain Relieving Factors rest    Multiple Pain Sites Yes    Pain Score 1    Pain Location Hip    Pain Orientation Left    Pain Descriptors / Indicators Aching;Sore    Pain Type Chronic pain    Pain Onset More than a month ago    Pain Frequency Intermittent    Aggravating Factors  walking    Pain Relieving Factors rest , PT                Skilled therapy interventions:   Therapeutic Exercise:  Stretching supine, 3 way hip with strap, bilateral  Knee to opposite  shoulder x 3 , LLE  SLR LLE x 15 neutral and then with hip ER x 10 small ROM  Bridge with ball x 10  Knee extension with ball squeeze, alt. X 10 each LE  Sidelying hip abduction with correction for form x 15   Seated cervical stretching Levator scapula deferred  Manual Therapy: Prone hip ER and IR with compression along glutes med, piriformis  Soft tissue to post lateral hip with trigger point work and ischemic  compression  Self care/Pt. Education:    FOTO, progress, recommend Dr. Tamala Julian or Dr. Clovis Riley practice for another opinion or alternative treatments.     PT Education - 12/20/20 1932     Education Details follow up with MD, consider Orthopedic/DO?    Person(s) Educated Patient    Methods Explanation    Comprehension Verbalized understanding                 PT Long Term Goals - 12/20/20 1932  PT LONG TERM GOAL #1   Title Pt will be I with HEP for hip and core    Status On-going      PT LONG TERM GOAL #2   Title Pt will be able to walk for at least 20 min x 3 per week to begin to positively impact her health    Status On-going      PT LONG TERM GOAL #3   Title Pt will be able to shop with pain in L hip < 2/10 for 1 hour    Baseline pain after cont to be moderate    Status On-going      PT LONG TERM GOAL #4   Title Pt will be able to stand on LL without Trendelenburg to show pelvic stability, support    Baseline painful    Status On-going                   Plan - 12/20/20 1602     Clinical Impression Statement Pt cont to have L knee pain more than L hip.  She has knee pain with crossing over her Rt leg for figure 4 stretch.  Modifed HEP for this.  She has an abnormal gait and is likely collapsing inward on L knee straining medial compartment.  Pain with single leg stance on LLE (hip).  Held ionto today and focused on strength and manual. Cont POC.    PT Treatment/Interventions ADLs/Self Care Home Management;Therapeutic  exercise;Patient/family education;Balance training;Therapeutic activities;Manual techniques;Dry needling;Functional mobility training;Cryotherapy;Electrical Stimulation;Moist Heat;Gait training    PT Next Visit Plan check cervical strethcing HEP?  hip and core strength, manual L hip,  Pilates, ionto?    PT Home Exercise Plan Access Code: [email or web address]    Consulted and Agree with Plan of Care Patient             Patient will benefit from skilled therapeutic intervention in order to improve the following deficits and impairments:  Abnormal gait, Decreased strength, Increased fascial restricitons, Impaired flexibility, Impaired UE functional use, Postural dysfunction, Pain, Decreased range of motion, Difficulty walking  Visit Diagnosis: Pain in left hip  Stiffness of left hip, not elsewhere classified  Difficulty in walking, not elsewhere classified  Pain in left leg     Problem List Patient Active Problem List   Diagnosis Date Noted   Piriformis syndrome of left side 09/27/2020   Cervical pain (neck) 09/27/2020   Hypokalemia 06/09/2019   Plantar fasciitis 06/09/2019   Claustrophobia 03/10/2019   Renal cyst 03/10/2019   Decreased GFR 01/14/2019   Seborrheic keratoses 01/14/2019   Headache 11/13/2017   Bilateral hearing loss 11/13/2017   Sinus pressure 11/13/2017   Radiculitis of leg 09/26/2017   Grief 10/13/2016   Right LBP 09/29/2014   Actinic keratoses 09/05/2012   Chest wall pain 09/05/2012   HTN (hypertension) 09/04/2011   Elbow pain, left 05/12/2010   Well adult exam 05/12/2010   WEIGHT LOSS 04/26/2009   Hypothyroidism 03/12/2007   Cough 03/12/2007   CERVICAL STRAIN 03/12/2007   GOITER, MULTINODULAR 11/29/2006   Fibromyalgia 11/29/2006   OSTEOPENIA 11/29/2006    Surya Folden, PT 12/20/2020, 7:36 PM  [facility] [address] [place], [place], [address] Phone: [phone]   Fax:  [phone]  Name: MARLITA KEIL MRN: [id number] Date of Birth: 21-Nov-1934  Raeford Razor, PT 12/20/20 7:42 PM Phone: [phone] Fax: [phone]

## 2020-12-22 ENCOUNTER — Ambulatory Visit: Payer: Medicare Other | Attending: Internal Medicine | Admitting: Physical Therapy

## 2020-12-22 ENCOUNTER — Other Ambulatory Visit: Payer: Self-pay

## 2020-12-22 ENCOUNTER — Encounter: Payer: Self-pay | Admitting: Physical Therapy

## 2020-12-22 DIAGNOSIS — R262 Difficulty in walking, not elsewhere classified: Secondary | ICD-10-CM | POA: Diagnosis not present

## 2020-12-22 DIAGNOSIS — M79605 Pain in left leg: Secondary | ICD-10-CM | POA: Diagnosis not present

## 2020-12-22 DIAGNOSIS — M25552 Pain in left hip: Secondary | ICD-10-CM | POA: Diagnosis not present

## 2020-12-22 DIAGNOSIS — M25652 Stiffness of left hip, not elsewhere classified: Secondary | ICD-10-CM | POA: Diagnosis not present

## 2020-12-22 NOTE — Therapy (Signed)
[place], [place], [address] Phone: [phone]   Fax:  [phone]  Physical Therapy Treatment  Patient Details  Name: Lindsay Maldonado MRN: [id number] Date of Birth: [birth year] Referring Provider (PT): Dr. Lew Dawes   Encounter Date: 12/22/2020   PT End of Session - 12/22/20 1028     Visit Number 7    Number of Visits 12    Date for PT Re-Evaluation 01/07/21    Authorization Type BCBS    PT Start Time 1015    PT Stop Time 1108    PT Time Calculation (min) 53 min    Activity Tolerance Patient tolerated treatment well    Behavior During Therapy [facility] for tasks assessed/performed             Past Medical History:  Diagnosis Date   Cyst of breast    Fibromyalgia    Goiter    multinodular   Hypothyroidism    Osteopenia     Past Surgical History:  Procedure Laterality Date   BREAST EXCISIONAL BIOPSY Left    benign   BREAST EXCISIONAL BIOPSY Right    benign   ENDOMETRIAL BIOPSY  2008   Dr. Loma Sousa CUFF REPAIR     Left   THYROID SURGERY  2007   Nodule removed    There were no vitals filed for this visit.   Subjective Assessment - 12/22/20 1020     Subjective The massage helped alot.  Hip is now 3/10.  No knee pain .    Currently in Pain? Yes    Pain Score 3     Pain Location Hip    Pain Orientation Left;Lateral    Pain Descriptors / Indicators Sore    Pain Type Chronic pain    Pain Onset More than a month ago    Pain Frequency Intermittent    Aggravating Factors  walking, standing    Pain Relieving Factors rest, massage             Skilled therapy interventions:   Therapeutic Exercise:  Pilates Reformer used for LE/core strength, postural strength, lumbopelvic disassociation and core control.  Exercises included:  Footwork 2 red 1 blue   Parallel blue band on thighs for outer hip activation   X 20   Bent knee fall out x 10 blue band  Single leg 2  red 1 blue in parallel LLE more challenging   Bridging with band x 10   SL footwork 2 red backed to 1 red 1 blue for hip ER   Parallel and ER press out each leg x 15  Both sides x 15-20 reps   Manual Therapy:  Sidelying to L piriformis, glute and lateral hip  Ice pack 10 min L lateral hip                    PT Long Term Goals - 12/20/20 1932       PT LONG TERM GOAL #1   Title Pt will be I with HEP for hip and core    Status On-going      PT LONG TERM GOAL #2   Title Pt will be able to walk for at least 20 min x 3 per week to begin to positively impact her health    Status On-going      PT LONG TERM GOAL #3   Title Pt will be able to shop with pain in L hip <  2/10 for 1 hour    Baseline pain after cont to be moderate    Status On-going      PT LONG TERM GOAL #4   Title Pt will be able to stand on LL without Trendelenburg to show pelvic stability, support    Baseline painful    Status On-going                   Plan - 12/22/20 1020     Clinical Impression Statement Pt reports less pain in hip and knee today.  She felt relief after her next session.  She can stand on her LLE better than the week previous.  Used Pilates Reformer for closed chain pain free exercise to strengthen hips and provide alignment cues for LLE.  No pain post session in knee or hip. cont POC.    PT Treatment/Interventions ADLs/Self Care Home Management;Therapeutic exercise;Patient/family education;Balance training;Therapeutic activities;Manual techniques;Dry needling;Functional mobility training;Cryotherapy;Electrical Stimulation;Moist Heat;Gait training    PT Next Visit Plan check cervical strethcing HEP?  hip and core strength, manual L hip,  Pilates, ionto?    PT Home Exercise Plan Access Code: [email or web address]    Consulted and Agree with Plan of Care Patient             Patient will benefit from skilled therapeutic intervention in order to improve the following deficits and  impairments:  Abnormal gait, Decreased strength, Increased fascial restricitons, Impaired flexibility, Impaired UE functional use, Postural dysfunction, Pain, Decreased range of motion, Difficulty walking  Visit Diagnosis: Pain in left hip  Difficulty in walking, not elsewhere classified  Stiffness of left hip, not elsewhere classified  Pain in left leg     Problem List Patient Active Problem List   Diagnosis Date Noted   Piriformis syndrome of left side 09/27/2020   Cervical pain (neck) 09/27/2020   Hypokalemia 06/09/2019   Plantar fasciitis 06/09/2019   Claustrophobia 03/10/2019   Renal cyst 03/10/2019   Decreased GFR 01/14/2019   Seborrheic keratoses 01/14/2019   Headache 11/13/2017   Bilateral hearing loss 11/13/2017   Sinus pressure 11/13/2017   Radiculitis of leg 09/26/2017   Grief 10/13/2016   Right LBP 09/29/2014   Actinic keratoses 09/05/2012   Chest wall pain 09/05/2012   HTN (hypertension) 09/04/2011   Elbow pain, left 05/12/2010   Well adult exam 05/12/2010   WEIGHT LOSS 04/26/2009   Hypothyroidism 03/12/2007   Cough 03/12/2007   CERVICAL STRAIN 03/12/2007   GOITER, MULTINODULAR 11/29/2006   Fibromyalgia 11/29/2006   OSTEOPENIA 11/29/2006    Joenathan Sakuma, PT 12/22/2020, 11:45 AM  [facility] [address] [place], [place], [address] Phone: [phone]   Fax:  [phone]  Name: AVIENDHA AZBELL MRN: [id number] Date of Birth: [birth year]  Raeford Razor, PT 12/22/20 11:46 AM Phone: [phone] Fax: [phone]

## 2020-12-23 ENCOUNTER — Encounter: Payer: Medicare Other | Admitting: Physical Therapy

## 2020-12-27 ENCOUNTER — Ambulatory Visit: Payer: Medicare Other | Admitting: Physical Therapy

## 2020-12-29 ENCOUNTER — Other Ambulatory Visit: Payer: Self-pay

## 2020-12-29 ENCOUNTER — Ambulatory Visit: Payer: Medicare Other | Admitting: Physical Therapy

## 2020-12-29 DIAGNOSIS — M25552 Pain in left hip: Secondary | ICD-10-CM | POA: Diagnosis not present

## 2020-12-29 DIAGNOSIS — M25652 Stiffness of left hip, not elsewhere classified: Secondary | ICD-10-CM

## 2020-12-29 DIAGNOSIS — M79605 Pain in left leg: Secondary | ICD-10-CM | POA: Diagnosis not present

## 2020-12-29 DIAGNOSIS — R262 Difficulty in walking, not elsewhere classified: Secondary | ICD-10-CM | POA: Diagnosis not present

## 2020-12-29 NOTE — Therapy (Signed)
[place], [place], [address] Phone: [phone]   Fax:  [phone]  Physical Therapy Treatment  Patient Details  Name: Lindsay Maldonado MRN: [id number] Date of Birth: 08-Feb-1935 Referring Provider (PT): Dr. Lew Dawes   Encounter Date: 12/29/2020   PT End of Session - 12/29/20 1037     Visit Number 8    Number of Visits 12    Date for PT Re-Evaluation 01/07/21    Authorization Type BCBS    PT Start Time 1021    PT Stop Time 1100    PT Time Calculation (min) 39 min    Activity Tolerance Patient tolerated treatment well    Behavior During Therapy [facility] for tasks assessed/performed             Past Medical History:  Diagnosis Date   Cyst of breast    Fibromyalgia    Goiter    multinodular   Hypothyroidism    Osteopenia     Past Surgical History:  Procedure Laterality Date   BREAST EXCISIONAL BIOPSY Left    benign   BREAST EXCISIONAL BIOPSY Right    benign   ENDOMETRIAL BIOPSY  2008   Dr. Loma Sousa CUFF REPAIR     Left   THYROID SURGERY  2007   Nodule removed    There were no vitals filed for this visit.   Subjective Assessment - 12/29/20 1022     Subjective 2/10 today and pain is in the L lateral hip.  She has reduced her walking to see if that helps.  She can lie on her L side now and she was not always able to do that.    Currently in Pain? Yes    Pain Score 2     Pain Location Hip    Pain Orientation Left;Lateral    Pain Descriptors / Indicators Aching;Sore    Pain Type Chronic pain    Pain Radiating Towards knee    Pain Onset More than a month ago    Pain Frequency Intermittent    Aggravating Factors  walking, standing    Pain Relieving Factors rest , massage                 [facility] Adult PT Treatment/Exercise:   Therapeutic Exercise: Recumbant bike 5 min level 2  Supine stretch L lateral hip: knees crossed with rotation, then figure 4 with knee  press  Ball squeeze x 10  Bridge with ball x 15  All 4's hip extension and donkey kick x 10 each on elbows for wrist pain modification   Manual Therapy:  L post hip  Compression to glutes an piriformis with Passive hip rotation ER and IR  Soft tissue ot glute med and piriformis IASTM to lateral thigh   Neuromuscular re-ed: N/A   Therapeutic Activity: N/A   Modalities: N/A   Self Care: POC, follow up with MD, benefit (MIN) of knee and hip XR.  Consider injection and /or cont therapy for ongoing L hip pain .    Consider / progression for next session:               PT Long Term Goals - 12/29/20 1037       PT LONG TERM GOAL #1   Title Pt will be I with HEP for hip and core    Status On-going      PT LONG TERM GOAL #2   Title Pt will be  able to walk for at least 20 min x 3 per week to begin to positively impact her health    Baseline has not done this    Status On-going      PT LONG TERM GOAL #3   Title Pt will be able to shop with pain in L hip < 2/10 for 1 hour    Baseline can be 2/10-4/10 with every step    Status On-going      PT LONG TERM GOAL #4   Title Pt will be able to stand on LL without Trendelenburg to show pelvic stability, support    Baseline painful    Status On-going                   Plan - 12/29/20 1023     Clinical Impression Statement Patient cont to have pain with "every step" and none at rest.  She was able to perform ther ex with ease today and after manual therapy was able to get up off the table and walk, stand on LLE without any pain at all.  She will cont to benefit from PT to move toward more closed chain strengthening for functional movement as tolerated.    PT Treatment/Interventions ADLs/Self Care Home Management;Therapeutic exercise;Patient/family education;Balance training;Therapeutic activities;Manual techniques;Dry needling;Functional mobility training;Cryotherapy;Electrical Stimulation;Moist Heat;Gait training     PT Next Visit Plan cont strength and manual as needed. renew for more visits , walking    PT Home Exercise Plan Access Code: [email or web address]    Consulted and Agree with Plan of Care Patient             Patient will benefit from skilled therapeutic intervention in order to improve the following deficits and impairments:  Abnormal gait, Decreased strength, Increased fascial restricitons, Impaired flexibility, Impaired UE functional use, Postural dysfunction, Pain, Decreased range of motion, Difficulty walking  Visit Diagnosis: Pain in left hip  Difficulty in walking, not elsewhere classified  Stiffness of left hip, not elsewhere classified  Pain in left leg     Problem List Patient Active Problem List   Diagnosis Date Noted   Piriformis syndrome of left side 09/27/2020   Cervical pain (neck) 09/27/2020   Hypokalemia 06/09/2019   Plantar fasciitis 06/09/2019   Claustrophobia 03/10/2019   Renal cyst 03/10/2019   Decreased GFR 01/14/2019   Seborrheic keratoses 01/14/2019   Headache 11/13/2017   Bilateral hearing loss 11/13/2017   Sinus pressure 11/13/2017   Radiculitis of leg 09/26/2017   Grief 10/13/2016   Right LBP 09/29/2014   Actinic keratoses 09/05/2012   Chest wall pain 09/05/2012   HTN (hypertension) 09/04/2011   Elbow pain, left 05/12/2010   Well adult exam 05/12/2010   WEIGHT LOSS 04/26/2009   Hypothyroidism 03/12/2007   Cough 03/12/2007   CERVICAL STRAIN 03/12/2007   GOITER, MULTINODULAR 11/29/2006   Fibromyalgia 11/29/2006   OSTEOPENIA 11/29/2006    Ifrah Vest, PT 12/29/2020, 11:30 AM  [facility] [address] [place], [place], [address] Phone: [phone]   Fax:  [phone]  Name: Lindsay Maldonado MRN: [id number] Date of Birth: 08/27/1934   Raeford Razor, PT 12/29/20 11:30 AM Phone: [phone] Fax: [phone]

## 2020-12-30 ENCOUNTER — Encounter: Payer: Medicare Other | Admitting: Physical Therapy

## 2021-01-03 ENCOUNTER — Ambulatory Visit: Payer: Medicare Other | Admitting: Physical Therapy

## 2021-01-03 ENCOUNTER — Other Ambulatory Visit: Payer: Self-pay

## 2021-01-03 DIAGNOSIS — R262 Difficulty in walking, not elsewhere classified: Secondary | ICD-10-CM | POA: Diagnosis not present

## 2021-01-03 DIAGNOSIS — M79605 Pain in left leg: Secondary | ICD-10-CM

## 2021-01-03 DIAGNOSIS — M25552 Pain in left hip: Secondary | ICD-10-CM | POA: Diagnosis not present

## 2021-01-03 DIAGNOSIS — M25652 Stiffness of left hip, not elsewhere classified: Secondary | ICD-10-CM | POA: Diagnosis not present

## 2021-01-03 NOTE — Therapy (Addendum)
[place], [place], [address] Phone: [phone]   Fax:  [phone]  Physical Therapy Treatment/Discharge  Patient Details  Name: Lindsay Maldonado MRN: [id number] Date of Birth: 19-Mar-1934 Referring Provider (PT): Dr. Lew Dawes   Encounter Date: 01/03/2021   PT End of Session - 01/03/21 1551     Visit Number 9    Number of Visits 12    Date for PT Re-Evaluation 01/07/21    Authorization Type BCBS    PT Start Time 9381    PT Stop Time 1630    PT Time Calculation (min) 41 min             Past Medical History:  Diagnosis Date   Cyst of breast    Fibromyalgia    Goiter    multinodular   Hypothyroidism    Osteopenia     Past Surgical History:  Procedure Laterality Date   BREAST EXCISIONAL BIOPSY Left    benign   BREAST EXCISIONAL BIOPSY Right    benign   ENDOMETRIAL BIOPSY  2008   Dr. Loma Sousa CUFF REPAIR     Left   THYROID SURGERY  2007   Nodule removed    There were no vitals filed for this visit.       [facility] PT Assessment - 01/03/21 0001       Strength   Right Hip Extension 4+/5    Right Hip ABduction 4+/5    Left Hip Extension 4+/5    Left Hip ABduction 4-/5      Transfers   Five time sit to stand comments  7.6 sec           [facility] Adult PT Treatment/Exercise:   Therapeutic Exercise: Supine stretch to L piriformis, 30 sec x 3  Bridging with high bolster, double then single leg bridge, noted difference R/L   Hamstring stretch bilateral 30 sec  x 2 to ITB LLE x 2 Step  up lateral 4 inch step x 10, pain In hip Standing squat x 10 no pain    Manual Therapy:  PROM L hip post capsule stretch , LAD Prone compression to L glutes, piriformis  and along  ITB  Neuromuscular re-ed: N/A   Therapeutic Activity: N/A   Modalities: N/A   Self Care: HEP, single leg stance, progression for home Strength and goals   Consider / progression for next  session:                   PT Long Term Goals - 01/03/21 1554       PT LONG TERM GOAL #1   Title Pt will be I with HEP for hip and core    Status On-going      PT LONG TERM GOAL #2   Title Pt will be able to walk for at least 20 min x 3 per week to begin to positively impact her health    Baseline has not done this purposefully for fitness , pain with each step, min to moderate    Status On-going      PT LONG TERM GOAL #3   Title Pt will be able to shop with pain in L hip < 2/10 for 1 hour    Baseline not usually > 2/10 if < 1 hour    Status Achieved      PT LONG TERM GOAL #4   Title Pt will be able to stand on LL  without Trendelenburg to show pelvic stability, support                   Plan - 01/03/21 1552     Clinical Impression Statement Pt will be travelling and out of town for the next few weeks.  She requested going on hold to see how she does without PT.  She is continuing to have consistent pain with walking.  Pain is improved with manual therapy to L posterior hip. Renew after holiday if she returns. FOTO still at 62%    PT Treatment/Interventions ADLs/Self Care Home Management;Therapeutic exercise;Patient/family education;Balance training;Therapeutic activities;Manual techniques;Dry needling;Functional mobility training;Cryotherapy;Electrical Stimulation;Moist Heat;Gait training    PT Next Visit Plan cont strength and manual as needed. renew for more visits , walking    PT Home Exercise Plan Access Code: [email or web address]    Consulted and Agree with Plan of Care Patient             Patient will benefit from skilled therapeutic intervention in order to improve the following deficits and impairments:  Abnormal gait, Decreased strength, Increased fascial restricitons, Impaired flexibility, Impaired UE functional use, Postural dysfunction, Pain, Decreased range of motion, Difficulty walking  Visit Diagnosis: Pain in left hip  Difficulty in walking, not  elsewhere classified  Stiffness of left hip, not elsewhere classified  Pain in left leg     Problem List Patient Active Problem List   Diagnosis Date Noted   Piriformis syndrome of left side 09/27/2020   Cervical pain (neck) 09/27/2020   Hypokalemia 06/09/2019   Plantar fasciitis 06/09/2019   Claustrophobia 03/10/2019   Renal cyst 03/10/2019   Decreased GFR 01/14/2019   Seborrheic keratoses 01/14/2019   Headache 11/13/2017   Bilateral hearing loss 11/13/2017   Sinus pressure 11/13/2017   Radiculitis of leg 09/26/2017   Grief 10/13/2016   Right LBP 09/29/2014   Actinic keratoses 09/05/2012   Chest wall pain 09/05/2012   HTN (hypertension) 09/04/2011   Elbow pain, left 05/12/2010   Well adult exam 05/12/2010   WEIGHT LOSS 04/26/2009   Hypothyroidism 03/12/2007   Cough 03/12/2007   CERVICAL STRAIN 03/12/2007   GOITER, MULTINODULAR 11/29/2006   Fibromyalgia 11/29/2006   OSTEOPENIA 11/29/2006    Holden Maniscalco, PT 01/03/2021, 4:34 PM  [facility] [address] [place], [place], [address] Phone: [phone]   Fax:  [phone]  Name: Lindsay Maldonado MRN: [id number] Date of Birth: 03/14/1934   Raeford Razor, PT 01/03/21 4:34 PM Phone: [phone] Fax: [phone]  PHYSICAL THERAPY DISCHARGE SUMMARY  Visits from Start of Care: 9  Current functional level related to goals / functional outcomes: See above    Remaining deficits: Hip pain , occ knee pain    Education / Equipment: HEP, anatomy, diff. Diagnosis    Patient agrees to discharge. Patient goals were partially met. Patient is being discharged due to not returning since the last visit.  Raeford Razor, PT 03/09/21 3:04 PM Phone: [phone] Fax: [phone]

## 2021-01-06 ENCOUNTER — Encounter: Payer: Medicare Other | Admitting: Physical Therapy

## 2021-01-10 ENCOUNTER — Ambulatory Visit: Payer: Medicare Other | Admitting: Physical Therapy

## 2021-01-17 ENCOUNTER — Encounter: Payer: Medicare Other | Admitting: Physical Therapy

## 2021-01-20 ENCOUNTER — Encounter: Payer: Medicare Other | Admitting: Physical Therapy

## 2021-01-31 ENCOUNTER — Telehealth: Payer: Self-pay | Admitting: Internal Medicine

## 2021-01-31 ENCOUNTER — Telehealth (INDEPENDENT_AMBULATORY_CARE_PROVIDER_SITE_OTHER): Payer: Medicare Other | Admitting: Physician Assistant

## 2021-01-31 ENCOUNTER — Encounter: Payer: Self-pay | Admitting: Physician Assistant

## 2021-01-31 VITALS — Ht 63.0 in | Wt 120.0 lb

## 2021-01-31 DIAGNOSIS — U071 COVID-19: Secondary | ICD-10-CM

## 2021-01-31 NOTE — Progress Notes (Signed)
Virtual Visit via Video   I connected with Lindsay Maldonado on 01/31/21 at  4:00 PM EST by a video enabled telemedicine application and verified that I am speaking with the correct person using two identifiers. Location patient: Home Location provider: [facility], Office Persons participating in the virtual visit: Natalyia, Innes PA-C, Anselmo Pickler, LPN   I discussed the limitations of evaluation and management by telemedicine and the availability of in person appointments. The patient expressed understanding and agreed to proceed.  I acted as a Education administrator for [employer], PA-C [employer], LPN   Subjective:   HPI:   Patient is requesting evaluation for possible COVID-19.  Symptom onset: Friday early morning  Travel/contacts: None   Vaccination status: 2 shots -- no boosters  Testing results: Home test on Friday was positive.  Patient endorses the following symptoms: Fatigue, no appetite, nausea, vomited yesterday. Fever Saturday was 101.7, last night 99.7. Pt is also having pain left neck, left shoulder, left hip and left knee -- hx of piriformis syndrome -- recently treated with PT but unsure if that is helping. Denies numbness/tingling.   Denies prior hx of asthma or COPD. Symptoms are overall improving.   Patient denies the following symptoms: Headache, Chest pain or SOB.  Treatments tried: Ibuprofen, Mucinex DM  Patient risk factors: Current [id number] risk of complications score: 4 Smoking status: AARIAN CLE[employer]ER  reports that she has never smoked. She has never used smokeless tobacco. If female, currently pregnant? []   Yes [x]   No  ROS: See pertinent positives and negatives per HPI.  Patient Active Problem List   Diagnosis Date Noted   Piriformis syndrome of left side 09/27/2020   Cervical pain (neck) 09/27/2020   Hypokalemia 06/09/2019   Plantar fasciitis 06/09/2019   Claustrophobia 03/10/2019   Renal cyst 03/10/2019    Decreased GFR 01/14/2019   Seborrheic keratoses 01/14/2019   Headache 11/13/2017   Bilateral hearing loss 11/13/2017   Sinus pressure 11/13/2017   Radiculitis of leg 09/26/2017   Grief 10/13/2016   Right LBP 09/29/2014   Actinic keratoses 09/05/2012   Chest wall pain 09/05/2012   HTN (hypertension) 09/04/2011   Elbow pain, left 05/12/2010   Well adult exam 05/12/2010   WEIGHT LOSS 04/26/2009   Hypothyroidism 03/12/2007   Cough 03/12/2007   CERVICAL STRAIN 03/12/2007   GOITER, MULTINODULAR 11/29/2006   Fibromyalgia 11/29/2006   OSTEOPENIA 11/29/2006    Social History   Tobacco Use   Smoking status: Never   Smokeless tobacco: Never  Substance Use Topics   Alcohol use: No    Current Outpatient Medications:    Cholecalciferol (VITAMIN D3) 50 MCG (2000 UT) capsule, Take 1 capsule (2,000 Units total) by mouth daily., Disp: 100 capsule, Rfl: 3   LORazepam (ATIVAN) 1 MG tablet, TAKE ONE TABLET BY MOUTH TWICE A DAY AS NEEDED FOR ANXIETY OR SLEEP, Disp: 60 tablet, Rfl: 2   SYNTHROID 75 MCG tablet, Take 1 tablet (75 mcg total) by mouth daily., Disp: 90 tablet, Rfl: 3   triamterene-hydrochlorothiazide (MAXZIDE-25) 37.5-25 MG tablet, Take 0.5 tablets by mouth daily. Annual appt is overdue must see provider for future refills, Disp: 45 tablet, Rfl: 3  Allergies  Allergen Reactions   Morphine     nausea    Objective:   VITALS: Per patient if applicable, see vitals. GENERAL: Alert, appears well and in no acute distress. HEENT: Atraumatic, conjunctiva clear, no obvious abnormalities on inspection of external nose and ears. NECK: Normal  movements of the head and neck. CARDIOPULMONARY: No increased WOB. Speaking in clear sentences. I:E ratio WNL.  MS: Moves all visible extremities without noticeable abnormality. PSYCH: Pleasant and cooperative, well-groomed. Speech normal rate and rhythm. Affect is appropriate. Insight and judgement are appropriate. Attention is focused, linear, and  appropriate.  NEURO: CN grossly intact. Oriented as arrived to appointment on time with no prompting. Moves both UE equally.  SKIN: No obvious lesions, wounds, erythema, or cyanosis noted on face or hands.  Assessment and Plan:   [name] was seen today for covid positive.  Diagnoses and all orders for this visit:  COVID-19   No red flags on discussion, patient is not in any obvious distress during our visit. Discussed progression of most viral illnesses, and recommended supportive care at this point in time.  She is currently on day 4/5 of sx. Since she is clinically improving and vaccinated, she would like to hold off on COVID anti-viral medications -- I think that this is reasonable.  I did recommend that she consider holding BP medication (maxzide) if she is not getting much fluid intake to prevent dehydration. I told her that she can send me a mychart message if she had concerns Re: her BP readings at home.  Discussed over the counter supportive care options, including Tylenol 500 mg q 8 hours, with recommendations to push fluids and rest. Reviewed return precautions including new/worsening fever, SOB, new/worsening cough, sudden onset changes of symptoms. Recommended need to self-quarantine and practice social distancing until symptoms resolve. I recommend that patient follow-up if symptoms worsen or persist despite treatment x 7-10 days, sooner if needed.  I discussed the assessment and treatment plan with the patient. The patient was provided an opportunity to ask questions and all were answered. The patient agreed with the plan and demonstrated an understanding of the instructions.   The patient was advised to call back or seek an in-person evaluation if the symptoms worsen or if the condition fails to improve as anticipated.   CMA or LPN served as scribe during this visit. History, Physical, and Plan performed by medical provider. The above documentation has been reviewed and is  accurate and complete.   Time spent with patient today was 15 minutes which consisted of chart review, discussing diagnosis, work up, treatment answering questions and documentation.  Lindsay Maldonado, [place] 01/31/2021

## 2021-01-31 NOTE — Telephone Encounter (Signed)
Connected to [facility] 12.11.2022.    Caller states she spoke with a nurse yesterday. Caller was told if she needed to speak with provider she could. Caller is requesting to have provider paged. Caller has tested positive for covid on Friday evening. Caller has body aches, low grade temp., N/V.

## 2021-01-31 NOTE — Telephone Encounter (Signed)
Pt connected to [facility] 12.10.2022.   Caller states she is positive for covid, cough, headache, fatigue, neck aches, nausea. What should she do?   Caller states she is positive for covid. c/o a cough, headache, fatigue, neck aches and nausea. no fever.   Advised to call PCP within 24 hours.Scheduled appt with another office.

## 2021-02-01 ENCOUNTER — Telehealth: Payer: Medicare Other | Admitting: Physician Assistant

## 2021-02-23 ENCOUNTER — Telehealth: Payer: Self-pay | Admitting: Internal Medicine

## 2021-02-23 DIAGNOSIS — M25562 Pain in left knee: Secondary | ICD-10-CM

## 2021-02-23 DIAGNOSIS — M25552 Pain in left hip: Secondary | ICD-10-CM

## 2021-02-23 DIAGNOSIS — G8929 Other chronic pain: Secondary | ICD-10-CM

## 2021-02-23 NOTE — Telephone Encounter (Signed)
Patient calling in  Patient says she is still experiencing the pain in left knee & hip & is requesting provider put order in for xray   Please call patient [phone]

## 2021-02-24 ENCOUNTER — Ambulatory Visit: Payer: Medicare Other

## 2021-02-24 NOTE — Telephone Encounter (Signed)
Notified pt w/ MD response. She states she is having hip pain too need xray addd. Notified pt will have md to add. Inform MD of hip xray.Lindsay Maldonado

## 2021-02-24 NOTE — Telephone Encounter (Signed)
OK. Thx

## 2021-02-24 NOTE — Telephone Encounter (Signed)
Ordered.  Thx

## 2021-03-01 ENCOUNTER — Other Ambulatory Visit: Payer: Self-pay

## 2021-03-01 ENCOUNTER — Ambulatory Visit (INDEPENDENT_AMBULATORY_CARE_PROVIDER_SITE_OTHER)
Admission: RE | Admit: 2021-03-01 | Discharge: 2021-03-01 | Disposition: A | Discharge status: Home / Self Care | Payer: Medicare Other | Source: Ambulatory Visit | Attending: Internal Medicine | Admitting: Internal Medicine

## 2021-03-01 DIAGNOSIS — M25552 Pain in left hip: Secondary | ICD-10-CM | POA: Diagnosis not present

## 2021-03-01 DIAGNOSIS — G8929 Other chronic pain: Secondary | ICD-10-CM

## 2021-03-01 DIAGNOSIS — M25562 Pain in left knee: Secondary | ICD-10-CM | POA: Diagnosis not present

## 2021-03-28 ENCOUNTER — Other Ambulatory Visit: Payer: Self-pay

## 2021-03-28 ENCOUNTER — Ambulatory Visit (INDEPENDENT_AMBULATORY_CARE_PROVIDER_SITE_OTHER): Payer: Medicare Other

## 2021-03-28 DIAGNOSIS — Z0000 Encounter for general adult medical examination without abnormal findings: Secondary | ICD-10-CM

## 2021-03-28 NOTE — Progress Notes (Signed)
I connected with [name] today by telephone and verified that I am speaking with the correct person using two identifiers. Location patient: home Location provider: work Persons participating in the virtual visit: patient, provider.   I discussed the limitations, risks, security and privacy concerns of performing an evaluation and management service by telephone and the availability of in person appointments. I also discussed with the patient that there may be a patient responsible charge related to this service. The patient expressed understanding and verbally consented to this telephonic visit.    Interactive audio and video telecommunications were attempted between this provider and patient, however failed, due to patient having technical difficulties OR patient did not have access to video capability.  We continued and completed visit with audio only.  Some vital signs may be absent or patient reported.   Time Spent with patient on telephone encounter: 40 minutes  Subjective:   Lindsay Maldonado is a 87 y.o. female who presents for Medicare Annual (Subsequent) preventive examination.  Review of Systems     Cardiac Risk Factors include: advanced age (>26men, >63 women);hypertension     Objective:    Today's Vitals   03/28/21 1003  PainSc: 5    There is no height or weight on file to calculate BMI.  Advanced Directives 03/28/2021 11/26/2020 06/20/2020 03/15/2020 10/12/2015  Does Patient Have a Medical Advance Directive? Yes Yes Yes Yes Yes  Type of Advance Directive Living will;Healthcare Power of [place];Living will Living will;Healthcare Power of Attorney Living will;Healthcare Power of Attorney Living will  Does patient want to make changes to medical advance directive? No - Patient declined No - Patient declined No - Patient declined No - Patient declined -  Copy of [place] in Chart? No - copy requested - No - copy  requested, Physician notified No - copy requested -  Would patient like information on creating a medical advance directive? - No - Patient declined No - Guardian declined - -    Current Medications (verified) Outpatient Encounter Medications as of 03/28/2021  Medication Sig   LORazepam (ATIVAN) 1 MG tablet TAKE ONE TABLET BY MOUTH TWICE A DAY AS NEEDED FOR ANXIETY OR SLEEP   SYNTHROID 75 MCG tablet Take 1 tablet (75 mcg total) by mouth daily.   triamterene-hydrochlorothiazide (MAXZIDE-25) 37.5-25 MG tablet Take 0.5 tablets by mouth daily. Annual appt is overdue must see provider for future refills   Cholecalciferol (VITAMIN D3) 50 MCG (2000 UT) capsule Take 1 capsule (2,000 Units total) by mouth daily. (Patient not taking: Reported on 03/28/2021)   No facility-administered encounter medications on file as of 03/28/2021.    Allergies (verified) Morphine   History: Past Medical History:  Diagnosis Date   Cyst of breast    Fibromyalgia    Goiter    multinodular   Hypothyroidism    Osteopenia    Past Surgical History:  Procedure Laterality Date   BREAST EXCISIONAL BIOPSY Left    benign   BREAST EXCISIONAL BIOPSY Right    benign   ENDOMETRIAL BIOPSY  2008   Dr. Loma Sousa CUFF REPAIR     Left   THYROID SURGERY  2007   Nodule removed   Family History  Problem Relation Age of Onset   Cancer Mother 58       breast   Parkinsonism Father    Social History   Socioeconomic History   Marital status: Widowed    Spouse name: Not  on file   Number of children: 5   Years of education: Not on file   Highest education level: Not on file  Occupational History   Occupation: housewife  Tobacco Use   Smoking status: Never   Smokeless tobacco: Never  Substance and Sexual Activity   Alcohol use: No   Drug use: Never   Sexual activity: Not on file  Other Topics Concern   Not on file  Social History Narrative   Physician Roster:   Dr. Deatra Ina   Dr. Watt Climes   Social  Determinants of Health   Financial Resource Strain: Low Risk    Difficulty of Paying Living Expenses: Not hard at all  Food Insecurity: No Food Insecurity   Worried About Charity fundraiser in the Last Year: Never true   Jo[place] in the Last Year: Never true  Transportation Needs: No Transportation Needs   Lack of Transportation (Medical): No   Lack of Transportation (Non-Medical): No  Physical Activity: Inactive   Days of Exercise per Week: 0 days   Minutes of Exercise per Session: 0 min  Stress: No Stress Concern Present   Feeling of Stress : Not at all  Social Connections: Moderately Integrated   Frequency of Communication with Friends and Family: More than three times a week   Frequency of Social Gatherings with Friends and Family: Once a week   Attends Religious Services: More than 4 times per year   Active Member of [employer] or Organizations: Yes   Attends Archivist Meetings: More than 4 times per year   Marital Status: Widowed    Tobacco Counseling Counseling given: Not Answered   Clinical Intake:  Pre-visit preparation completed: No  Pain : 0-10 Pain Score: 5  Pain Type: Chronic pain Pain Location: Hip Pain Orientation: Left Pain Radiating Towards: left knee during walking Pain Descriptors / Indicators: Discomfort, Dull Pain Onset: More than a month ago Pain Frequency: Intermittent (with walking only) Pain Relieving Factors: none Effect of Pain on Daily Activities: Pain produces disability and affects the quality of life.  Pain Relieving Factors: none  Nutritional Risks: None Diabetes: No  How often do you need to have someone help you when you read instructions, pamphlets, or other written materials from your doctor or pharmacy?: 1 - Never What is the last grade level you completed in school?: High School Graduate  Diabetic? no  Interpreter Needed?: No  Information entered by :: Lisette Abu, LPN   Activities of Daily Living In  your present state of health, do you have any difficulty performing the following activities: 03/28/2021  Hearing? N  Vision? N  Difficulty concentrating or making decisions? N  Walking or climbing stairs? N  Dressing or bathing? N  Doing errands, shopping? N  Preparing Food and eating ? N  Using the Toilet? N  In the past six months, have you accidently leaked urine? N  Do you have problems with loss of bowel control? N  Managing your Medications? N  Managing your Finances? N  Housekeeping or managing your Housekeeping? N  Some recent data might be hidden    Patient Care Team: Plotnikov, Evie Lacks, MD as PCP - General Alden Hipp, MD (Obstetrics and Gynecology) Clarene Essex, MD (Gastroenterology) Darleen Crocker, MD as Consulting Physician (Ophthalmology) Webb Laws, [place] as Referring Physician (Optometry) Lavonna Monarch, MD as Consulting Physician (Dermatology)  Indicate any recent Medical Services you may have received from other than [facility] providers in the past year (  date may be approximate).     Assessment:   This is a routine wellness examination for [place].  Hearing/Vision screen Hearing Screening - Comments:: Patient denied any hearing difficulty.   No hearing aids.  Vision Screening - Comments:: Patient does not wear any corrective glasses/contacts.  Eye exam done annually by: Darleen Crocker, MD and Webb Laws, MD.  Dietary issues and exercise activities discussed: Current Exercise Habits: The patient does not participate in regular exercise at present, Exercise limited by: orthopedic condition(s);neurologic condition(s)   Goals Addressed               This Visit's Progress     Patient Stated (pt-stated)        My goal is to stay up right and eat healthier.      Depression Screen PHQ 2/9 Scores 03/28/2021 03/22/2020 03/15/2020 01/14/2019 01/07/2018 10/13/2016 10/12/2015  PHQ - 2 Score 0 0 0 0 0 0 0    Fall Risk Fall Risk  03/28/2021 03/22/2020  03/15/2020 01/14/2019 01/07/2018  Falls in the past year? 0 0 1 0 0  Number falls in past yr: 0 0 0 - -  Injury with Fall? 0 0 0 - -  Risk for fall due to : No Fall Risks No Fall Risks - - -  Follow up Falls evaluation completed - - Falls evaluation completed -    FALL RISK PREVENTION PERTAINING TO THE HOME:  Any stairs in or around the home? Yes  If so, are there any without handrails? No  Home free of loose throw rugs in walkways, pet beds, electrical cords, etc? Yes  Adequate lighting in your home to reduce risk of falls? Yes   ASSISTIVE DEVICES UTILIZED TO PREVENT FALLS:  Life alert? No  Use of a cane, walker or w/c? No  Grab bars in the bathroom? No  Shower chair or bench in shower? Yes  Elevated toilet seat or a handicapped toilet? No   TIMED UP AND GO:  Was the test performed? No .  Length of time to ambulate 10 feet: n/a sec.   Gait steady and fast without use of assistive device  Cognitive Function: Normal cognitive status assessed by direct observation by this Nurse Health Advisor. No abnormalities found.   MMSE - Mini Mental State Exam 10/12/2015  Not completed: (No Data)        Immunizations Immunization History  Administered Date(s) Administered   Influenza-Unspecified 12/21/2017   Moderna Sars-Covid-2 Vaccination 03/04/2019, 04/01/2019   Pneumococcal Conjugate-13 09/25/2013   Pneumococcal Polysaccharide-23 01/09/2005, 10/12/2015   Td 02/21/2004   Tdap 10/13/2016   Zoster, Live 04/29/2009    TDAP status: Up to date  Flu Vaccine status: Declined, Education has been provided regarding the importance of this vaccine but patient still declined. Advised may receive this vaccine at local pharmacy or Health Dept. Aware to provide a copy of the vaccination record if obtained from local pharmacy or Health Dept. Verbalized acceptance and understanding.  Pneumococcal vaccine status: Up to date  Covid-19 vaccine status: Completed vaccines  Qualifies for  Shingles Vaccine? Yes   Zostavax completed Yes   Shingrix Completed?: No.    Education has been provided regarding the importance of this vaccine. Patient has been advised to call insurance company to determine out of pocket expense if they have not yet received this vaccine. Advised may also receive vaccine at local pharmacy or Health Dept. Verbalized acceptance and understanding.  Screening Tests Health Maintenance  Topic Date Due   Zoster  Vaccines- Shingrix (1 of 2) Never done   COVID-19 Vaccine (3 - Moderna risk series) 04/29/2019   INFLUENZA VACCINE  09/20/2020   TETANUS/TDAP  10/14/2026   Pneumonia Vaccine 62+ Years old  Completed   HPV VACCINES  Aged Out   DEXA SCAN  Discontinued    Health Maintenance  Health Maintenance Due  Topic Date Due   Zoster Vaccines- Shingrix (1 of 2) Never done   COVID-19 Vaccine (3 - Moderna risk series) 04/29/2019   INFLUENZA VACCINE  09/20/2020    Colorectal cancer screening: No longer required.   Mammogram status: Completed 05/12/2020. Repeat every year  Bone density status: never done  Lung Cancer Screening: (Low Dose CT Chest recommended if Age 24-80 years, 30 pack-year currently smoking OR have quit w/in 15years.) does not qualify.   Lung Cancer Screening Referral: no  Additional Screening:  Hepatitis C Screening: does not qualify; Completed no  Vision Screening: Recommended annual ophthalmology exams for early detection of glaucoma and other disorders of the eye. Is the patient up to date with their annual eye exam?  Yes  Who is the provider or what is the name of the office in which the patient attends annual eye exams? Darleen Crocker, MD and Webb Laws, OD. If pt is not established with a provider, would they like to be referred to a provider to establish care? No .   Dental Screening: Recommended annual dental exams for proper oral hygiene  Community Resource Referral / Chronic Care Management: CRR required this visit?   No   CCM required this visit?  No      Plan:     I have personally reviewed and noted the following in the patients chart:   Medical and social history Use of alcohol, tobacco or illicit drugs  Current medications and supplements including opioid prescriptions.  Functional ability and status Nutritional status Physical activity Advanced directives List of other physicians Hospitalizations, surgeries, and ER visits in previous 12 months Vitals Screenings to include cognitive, depression, and falls Referrals and appointments  In addition, I have reviewed and discussed with patient certain preventive protocols, quality metrics, and best practice recommendations. A written personalized care plan for preventive services as well as general preventive health recommendations were provided to patient.     Sheral Flow, LPN   09/20/173   Nurse Notes:  Patient is cogitatively intact. There were no vitals filed for this visit. There is no height or weight on file to calculate BMI.

## 2021-03-28 NOTE — Patient Instructions (Signed)
Lindsay Maldonado , Thank you for taking time to come for your Medicare Wellness Visit. I appreciate your ongoing commitment to your health goals. Please review the following plan we discussed and let me know if I can assist you in the future.   Screening recommendations/referrals: Colonoscopy: Not a candidate for screening due to age 87: 05/12/2020' due every 1-2 years Bone Density: never done Recommended yearly ophthalmology/optometry visit for glaucoma screening and checkup Recommended yearly dental visit for hygiene and checkup  Vaccinations: Influenza vaccine: declined Pneumococcal vaccine: 09/25/2013, 10/12/2019 Tdap vaccine: 04/15/2016; due every 10 years  Shingles vaccine: never done; can check with local pharmacy   Covid-19: 03/04/2019, 04/01/2019  Advanced directives: Please bring a copy of your health care power of attorney and living will to the office at your convenience.  Conditions/risks identified: Yes; Client understands the importance of follow-up with providers by attending scheduled visits and discussed goals to eat healthier, increase physical activity, exercise the brain, socialize more, get enough sleep and make time for laughter.  Next appointment: Please schedule your next Medicare Wellness Visit with your Nurse Health Advisor in 1 year by calling [phone].   Preventive Care 45 Years and Older, Female Preventive care refers to lifestyle choices and visits with your health care provider that can promote health and wellness. What does preventive care include? A yearly physical exam. This is also called an annual well check. Dental exams once or twice a year. Routine eye exams. Ask your health care provider how often you should have your eyes checked. Personal lifestyle choices, including: Daily care of your teeth and gums. Regular physical activity. Eating a healthy diet. Avoiding tobacco and drug use. Limiting alcohol use. Practicing safe sex. Taking low-dose  aspirin every day. Taking vitamin and mineral supplements as recommended by your health care provider. What happens during an annual well check? The services and screenings done by your health care provider during your annual well check will depend on your age, overall health, lifestyle risk factors, and family history of disease. Counseling  Your health care provider may ask you questions about your: Alcohol use. Tobacco use. Drug use. Emotional well-being. Home and relationship well-being. Sexual activity. Eating habits. History of falls. Memory and ability to understand (cognition). Work and work Statistician. Reproductive health. Screening  You may have the following tests or measurements: Height, weight, and BMI. Blood pressure. Lipid and cholesterol levels. These may be checked every 5 years, or more frequently if you are over 83 years old. Skin check. Lung cancer screening. You may have this screening every year starting at age 22 if you have a 30-pack-year history of smoking and currently smoke or have quit within the past 15 years. Fecal occult blood test (FOBT) of the stool. You may have this test every year starting at age 13. Flexible sigmoidoscopy or colonoscopy. You may have a sigmoidoscopy every 5 years or a colonoscopy every 10 years starting at age 36. Hepatitis C blood test. Hepatitis B blood test. Sexually transmitted disease (STD) testing. Diabetes screening. This is done by checking your blood sugar (glucose) after you have not eaten for a while (fasting). You may have this done every 1-3 years. Bone density scan. This is done to screen for osteoporosis. You may have this done starting at age 3. Mammogram. This may be done every 1-2 years. Talk to your health care provider about how often you should have regular mammograms. Talk with your health care provider about your test results, treatment options, and if  necessary, the need for more tests. Vaccines  Your  health care provider may recommend certain vaccines, such as: Influenza vaccine. This is recommended every year. Tetanus, diphtheria, and acellular pertussis (Tdap, Td) vaccine. You may need a Td booster every 10 years. Zoster vaccine. You may need this after age 79. Pneumococcal 13-valent conjugate (PCV13) vaccine. One dose is recommended after age 22. Pneumococcal polysaccharide (PPSV23) vaccine. One dose is recommended after age 98. Talk to your health care provider about which screenings and vaccines you need and how often you need them. This information is not intended to replace advice given to you by your health care provider. Make sure you discuss any questions you have with your health care provider. Document Released: 03/05/2015 Document Revised: 10/27/2015 Document Reviewed: 12/08/2014 Elsevier Interactive Patient Education  2017 [employer] Prevention in the Home Falls can cause injuries. They can happen to people of all ages. There are many things you can do to make your home safe and to help prevent falls. What can I do on the outside of my home? Regularly fix the edges of walkways and driveways and fix any cracks. Remove anything that might make you trip as you walk through a door, such as a raised step or threshold. Trim any bushes or trees on the path to your home. Use bright outdoor lighting. Clear any walking paths of anything that might make someone trip, such as rocks or tools. Regularly check to see if handrails are loose or broken. Make sure that both sides of any steps have handrails. Any raised decks and porches should have guardrails on the edges. Have any leaves, snow, or ice cleared regularly. Use sand or salt on walking paths during winter. Clean up any spills in your garage right away. This includes oil or grease spills. What can I do in the bathroom? Use night lights. Install grab bars by the toilet and in the tub and shower. Do not use towel bars as  grab bars. Use non-skid mats or decals in the tub or shower. If you need to sit down in the shower, use a plastic, non-slip stool. Keep the floor dry. Clean up any water that spills on the floor as soon as it happens. Remove soap buildup in the tub or shower regularly. Attach bath mats securely with double-sided non-slip rug tape. Do not have throw rugs and other things on the floor that can make you trip. What can I do in the bedroom? Use night lights. Make sure that you have a light by your bed that is easy to reach. Do not use any sheets or blankets that are too big for your bed. They should not hang down onto the floor. Have a firm chair that has side arms. You can use this for support while you get dressed. Do not have throw rugs and other things on the floor that can make you trip. What can I do in the kitchen? Clean up any spills right away. Avoid walking on wet floors. Keep items that you use a lot in easy-to-reach places. If you need to reach something above you, use a strong step stool that has a grab bar. Keep electrical cords out of the way. Do not use floor polish or wax that makes floors slippery. If you must use wax, use non-skid floor wax. Do not have throw rugs and other things on the floor that can make you trip. What can I do with my stairs? Do not leave any items  on the stairs. Make sure that there are handrails on both sides of the stairs and use them. Fix handrails that are broken or loose. Make sure that handrails are as long as the stairways. Check any carpeting to make sure that it is firmly attached to the stairs. Fix any carpet that is loose or worn. Avoid having throw rugs at the top or bottom of the stairs. If you do have throw rugs, attach them to the floor with carpet tape. Make sure that you have a light switch at the top of the stairs and the bottom of the stairs. If you do not have them, ask someone to add them for you. What else can I do to help prevent  falls? Wear shoes that: Do not have high heels. Have rubber bottoms. Are comfortable and fit you well. Are closed at the toe. Do not wear sandals. If you use a stepladder: Make sure that it is fully opened. Do not climb a closed stepladder. Make sure that both sides of the stepladder are locked into place. Ask someone to hold it for you, if possible. Clearly mark and make sure that you can see: Any grab bars or handrails. First and last steps. Where the edge of each step is. Use tools that help you move around (mobility aids) if they are needed. These include: Canes. Walkers. Scooters. Crutches. Turn on the lights when you go into a dark area. Replace any light bulbs as soon as they burn out. Set up your furniture so you have a clear path. Avoid moving your furniture around. If any of your floors are uneven, fix them. If there are any pets around you, be aware of where they are. Review your medicines with your doctor. Some medicines can make you feel dizzy. This can increase your chance of falling. Ask your doctor what other things that you can do to help prevent falls. This information is not intended to replace advice given to you by your health care provider. Make sure you discuss any questions you have with your health care provider. Document Released: 12/03/2008 Document Revised: 07/15/2015 Document Reviewed: 03/13/2014 Elsevier Interactive Patient Education  2017 [employer].

## 2021-03-29 ENCOUNTER — Encounter: Payer: Self-pay | Admitting: Internal Medicine

## 2021-03-29 ENCOUNTER — Ambulatory Visit (INDEPENDENT_AMBULATORY_CARE_PROVIDER_SITE_OTHER): Payer: Medicare Other | Admitting: Internal Medicine

## 2021-03-29 VITALS — BP 128/76 | HR 73 | Temp 98.8°F | Ht 63.0 in | Wt 117.0 lb

## 2021-03-29 DIAGNOSIS — M5410 Radiculopathy, site unspecified: Secondary | ICD-10-CM

## 2021-03-29 DIAGNOSIS — R944 Abnormal results of kidney function studies: Secondary | ICD-10-CM | POA: Diagnosis not present

## 2021-03-29 DIAGNOSIS — I10 Essential (primary) hypertension: Secondary | ICD-10-CM

## 2021-03-29 DIAGNOSIS — E785 Hyperlipidemia, unspecified: Secondary | ICD-10-CM | POA: Diagnosis not present

## 2021-03-29 DIAGNOSIS — M545 Low back pain, unspecified: Secondary | ICD-10-CM

## 2021-03-29 DIAGNOSIS — Z0000 Encounter for general adult medical examination without abnormal findings: Secondary | ICD-10-CM

## 2021-03-29 DIAGNOSIS — M1990 Unspecified osteoarthritis, unspecified site: Secondary | ICD-10-CM | POA: Diagnosis not present

## 2021-03-29 DIAGNOSIS — G5702 Lesion of sciatic nerve, left lower limb: Secondary | ICD-10-CM | POA: Diagnosis not present

## 2021-03-29 DIAGNOSIS — E034 Atrophy of thyroid (acquired): Secondary | ICD-10-CM

## 2021-03-29 DIAGNOSIS — E030 Congenital hypothyroidism with diffuse goiter: Secondary | ICD-10-CM

## 2021-03-29 DIAGNOSIS — G8929 Other chronic pain: Secondary | ICD-10-CM

## 2021-03-29 DIAGNOSIS — Z8616 Personal history of COVID-19: Secondary | ICD-10-CM

## 2021-03-29 LAB — URINALYSIS
Bilirubin Urine: NEGATIVE
Hgb urine dipstick: NEGATIVE
Ketones, ur: NEGATIVE
Leukocytes,Ua: NEGATIVE
Nitrite: NEGATIVE
Specific Gravity, Urine: 1.01 (ref 1.000–1.030)
Total Protein, Urine: NEGATIVE
Urine Glucose: NEGATIVE
Urobilinogen, UA: 0.2 (ref 0.0–1.0)
pH: 8 (ref 5.0–8.0)

## 2021-03-29 LAB — COMPREHENSIVE METABOLIC PANEL
ALT: 14 U/L (ref 0–35)
AST: 17 U/L (ref 0–37)
Albumin: 4.2 g/dL (ref 3.5–5.2)
Alkaline Phosphatase: 57 U/L (ref 39–117)
BUN: 13 mg/dL (ref 6–23)
CO2: 33 mEq/L — ABNORMAL HIGH (ref 19–32)
Calcium: 9.6 mg/dL (ref 8.4–10.5)
Chloride: 101 mEq/L (ref 96–112)
Creatinine, Ser: 0.84 mg/dL (ref 0.40–1.20)
GFR: 63.58 mL/min (ref >60.00)
Glucose, Bld: 82 mg/dL (ref 70–99)
Potassium: 4 mEq/L (ref 3.5–5.1)
Sodium: 139 mEq/L (ref 135–145)
Total Bilirubin: 0.6 mg/dL (ref 0.2–1.2)
Total Protein: 6.7 g/dL (ref 6.0–8.3)

## 2021-03-29 LAB — CBC WITH DIFFERENTIAL/PLATELET
Basophils Absolute: 0 10*3/uL (ref 0.0–0.1)
Basophils Relative: 0.4 % (ref 0.0–3.0)
Eosinophils Absolute: 0.2 10*3/uL (ref 0.0–0.7)
Eosinophils Relative: 5.2 % — ABNORMAL HIGH (ref 0.0–5.0)
HCT: 41.2 % (ref 36.0–46.0)
Hemoglobin: 13.6 g/dL (ref 12.0–15.0)
Lymphocytes Relative: 22.5 % (ref 12.0–46.0)
Lymphs Abs: 0.9 10*3/uL (ref 0.7–4.0)
MCHC: 33 g/dL (ref 30.0–36.0)
MCV: 88.9 fl (ref 78.0–100.0)
Monocytes Absolute: 0.3 10*3/uL (ref 0.1–1.0)
Monocytes Relative: 9.1 % (ref 3.0–12.0)
Neutro Abs: 2.4 10*3/uL (ref 1.4–7.7)
Neutrophils Relative %: 62.8 % (ref 43.0–77.0)
Platelets: 186 10*3/uL (ref 150.0–400.0)
RBC: 4.63 Mil/uL (ref 3.87–5.11)
RDW: 13.4 % (ref 11.5–15.5)
WBC: 3.8 10*3/uL — ABNORMAL LOW (ref 4.0–10.5)

## 2021-03-29 LAB — LIPID PANEL
Cholesterol: 188 mg/dL (ref 0–200)
HDL: 89.9 mg/dL (ref >39.00)
LDL Cholesterol: 87 mg/dL (ref 0–99)
NonHDL: 98.38
Total CHOL/HDL Ratio: 2
Triglycerides: 59 mg/dL (ref 0.0–149.0)
VLDL: 11.8 mg/dL (ref 0.0–40.0)

## 2021-03-29 LAB — TSH: TSH: 1.86 u[IU]/mL (ref 0.35–5.50)

## 2021-03-29 MED ORDER — SYNTHROID 75 MCG PO TABS: 75.0000 ug | ORAL_TABLET | Freq: Every day | ORAL | 3 refills | Status: DC

## 2021-03-29 MED ORDER — TRIAMTERENE-HCTZ 37.5-25 MG PO TABS: 0.5000 | ORAL_TABLET | Freq: Every day | ORAL | 3 refills | Status: DC

## 2021-03-29 MED ORDER — LORAZEPAM 1 MG PO TABS: ORAL_TABLET | ORAL | 2 refills | Status: DC

## 2021-03-29 NOTE — Assessment & Plan Note (Signed)
In [facility] - dry needling, massage

## 2021-03-29 NOTE — Assessment & Plan Note (Addendum)
COVID in Dec 22, recovered

## 2021-03-29 NOTE — Assessment & Plan Note (Signed)
Knees, hip pain -   in [facility] - dry needling, massage

## 2021-03-29 NOTE — Progress Notes (Signed)
Subjective:  Patient ID: Lindsay Maldonado, female    DOB: 21-Jan-1935  Age: 87 y.o. MRN: [id number]  CC: Annual Exam   HPI Lindsay Maldonado presents for hypothyroidism, OA - in PT, anxiety Post- COVID in Dec 22 Well exam  Outpatient Medications Prior to Visit  Medication Sig Dispense Refill   Cholecalciferol (VITAMIN D3) 50 MCG (2000 UT) capsule Take 1 capsule (2,000 Units total) by mouth daily. 100 capsule 3   LORazepam (ATIVAN) 1 MG tablet TAKE ONE TABLET BY MOUTH TWICE A DAY AS NEEDED FOR ANXIETY OR SLEEP 60 tablet 2   SYNTHROID 75 MCG tablet Take 1 tablet (75 mcg total) by mouth daily. 90 tablet 3   triamterene-hydrochlorothiazide (MAXZIDE-25) 37.5-25 MG tablet Take 0.5 tablets by mouth daily. Annual appt is overdue must see provider for future refills 45 tablet 3   No facility-administered medications prior to visit.    ROS: Review of Systems  Constitutional:  Negative for activity change, appetite change, chills, fatigue and unexpected weight change.  HENT:  Negative for congestion, mouth sores and sinus pressure.   Eyes:  Negative for visual disturbance.  Respiratory:  Negative for cough and chest tightness.   Gastrointestinal:  Negative for abdominal pain and nausea.  Genitourinary:  Negative for difficulty urinating, frequency and vaginal pain.  Musculoskeletal:  Positive for arthralgias and gait problem. Negative for back pain.  Skin:  Negative for pallor and rash.  Neurological:  Negative for dizziness, tremors, weakness, numbness and headaches.  Psychiatric/Behavioral:  Negative for confusion, sleep disturbance and suicidal ideas. The patient is nervous/anxious.    Objective:  BP 128/76 (BP Location: Left Arm, Patient Position: Sitting, Cuff Size: Normal)    Pulse 73    Temp 98.8 F (37.1 C) (Oral)    Ht 5\' 3"  (1.6 m)    Wt 117 lb (53.1 kg)    SpO2 97%    BMI 20.73 kg/m   BP Readings from Last 3 Encounters:  03/29/21 128/76  09/27/20 128/68  06/20/20 (!) 155/75     Wt Readings from Last 3 Encounters:  03/29/21 117 lb (53.1 kg)  01/31/21 120 lb (54.4 kg)  09/27/20 121 lb 6.4 oz (55.1 kg)    Physical Exam Constitutional:      General: She is not in acute distress.    Appearance: She is well-developed.  HENT:     Head: Normocephalic.     Right Ear: External ear normal.     Left Ear: External ear normal.     Nose: Nose normal.  Eyes:     General:        Right eye: No discharge.        Left eye: No discharge.     Conjunctiva/sclera: Conjunctivae normal.     Pupils: Pupils are equal, round, and reactive to light.  Neck:     Thyroid: No thyromegaly.     Vascular: No JVD.     Trachea: No tracheal deviation.  Cardiovascular:     Rate and Rhythm: Normal rate and regular rhythm.     Heart sounds: Normal heart sounds.  Pulmonary:     Effort: No respiratory distress.     Breath sounds: No stridor. No wheezing.  Abdominal:     General: Bowel sounds are normal. There is no distension.     Palpations: Abdomen is soft. There is no mass.     Tenderness: There is no abdominal tenderness. There is no guarding or rebound.  Musculoskeletal:  General: No tenderness.     Cervical back: Normal range of motion and neck supple. No rigidity.  Lymphadenopathy:     Cervical: No cervical adenopathy.  Skin:    Findings: No erythema or rash.  Neurological:     Mental Status: She is oriented to person, place, and time.     Cranial Nerves: No cranial nerve deficit.     Motor: No abnormal muscle tone.     Coordination: Coordination normal.     Gait: Gait abnormal.     Deep Tendon Reflexes: Reflexes normal.  Psychiatric:        Behavior: Behavior normal.        Thought Content: Thought content normal.        Judgment: Judgment normal.    Lab Results  Component Value Date   WBC 3.8 (L) 09/24/2020   HGB 13.4 09/24/2020   HCT 40.1 09/24/2020   PLT 185.0 09/24/2020   GLUCOSE 90 09/24/2020   CHOL 184 09/24/2020   TRIG 64.0 09/24/2020   HDL  89.20 09/24/2020   LDLCALC 82 09/24/2020   ALT 13 09/24/2020   AST 16 09/24/2020   NA 133 (L) 09/24/2020   K 3.8 09/24/2020   CL 97 09/24/2020   CREATININE 0.80 09/24/2020   BUN 13 09/24/2020   CO2 30 09/24/2020   TSH 2.63 09/24/2020    DG HIP UNILAT WITH PELVIS 2-3 VIEWS LEFT  Result Date: 03/01/2021 CLINICAL DATA:  Left hip pain. EXAM: DG HIP (WITH OR WITHOUT PELVIS) 2-3V LEFT COMPARISON:  None. FINDINGS: There is no evidence of hip fracture or dislocation. There is no evidence of arthropathy or other focal bone abnormality. IMPRESSION: Negative. Electronically Signed   By: Ronney Asters M.D.   On: 03/01/2021 18:14   DG Knee 3 Views Left  Result Date: 03/01/2021 CLINICAL DATA:  Left knee pain EXAM: LEFT KNEE - 3 VIEW COMPARISON:  None. FINDINGS: No evidence of fracture, dislocation, or joint effusion. No evidence of arthropathy or other focal bone abnormality. Soft tissues are unremarkable. IMPRESSION: No significant degenerative change. Electronically Signed   By: Yvonne Kendall   On: 03/01/2021 18:17    Assessment & Plan:   Problem List Items Addressed This Visit     Decreased GFR    Monitor BMET      Relevant Orders   TSH   Urinalysis   Comprehensive metabolic panel   Lipid panel   History of COVID-19    COVID in Dec 22, recovered      HTN (hypertension)    Maxzide 1/2 tab      Relevant Medications   triamterene-hydrochlorothiazide (MAXZIDE-25) 37.5-25 MG tablet   Hypothyroidism    Check TSH Levothroid      Relevant Medications   SYNTHROID 75 MCG tablet   Other Relevant Orders   TSH   Osteoarthritis    Knees, hip pain -   in [facility] - dry needling, massage      Piriformis syndrome of left side    In [place] [facility] - dry needling, massage      Relevant Medications   LORazepam (ATIVAN) 1 MG tablet   Radiculitis of leg    in [facility] - dry needling, massage      Relevant Medications   LORazepam (ATIVAN) 1 MG tablet   Well adult exam     We discussed  age appropriate health related issues, including available/recomended screening tests and vaccinations. Labs were ordered to be later reviewed . All questions  were answered. We discussed one or more of the following - seat belt use, use of sunscreen/sun exposure exercise, fall risk reduction, second hand smoke exposure, firearm use and storage, seat belt use, a need for adhering to healthy diet and exercise. Labs were ordered.  All questions were answered.        Other Visit Diagnoses     Medicare annual wellness visit, subsequent       Chronic right-sided low back pain without sciatica       Dyslipidemia       Relevant Orders   TSH   Lipid panel         Meds ordered this encounter  Medications   LORazepam (ATIVAN) 1 MG tablet    Sig: TAKE ONE TABLET BY MOUTH TWICE A DAY AS NEEDED FOR ANXIETY OR SLEEP    Dispense:  60 tablet    Refill:  2   SYNTHROID 75 MCG tablet    Sig: Take 1 tablet (75 mcg total) by mouth daily.    Dispense:  90 tablet    Refill:  3   triamterene-hydrochlorothiazide (MAXZIDE-25) 37.5-25 MG tablet    Sig: Take 0.5 tablets by mouth daily. Annual appt is overdue must see provider for future refills    Dispense:  45 tablet    Refill:  3      Follow-up: Return in about 6 months (around 09/26/2021) for a follow-up visit.  Walker Kehr, MD

## 2021-03-29 NOTE — Assessment & Plan Note (Signed)
in [place] [facility] - dry needling, massage

## 2021-03-29 NOTE — Assessment & Plan Note (Addendum)
Check TSH Levothroid

## 2021-03-29 NOTE — Assessment & Plan Note (Signed)

## 2021-03-29 NOTE — Assessment & Plan Note (Signed)
Monitor BMET. 

## 2021-03-29 NOTE — Assessment & Plan Note (Signed)
Maxzide 1/2 tab

## 2021-04-27 ENCOUNTER — Encounter: Payer: Self-pay | Admitting: Internal Medicine

## 2021-05-04 DIAGNOSIS — M25562 Pain in left knee: Secondary | ICD-10-CM | POA: Diagnosis not present

## 2021-05-04 DIAGNOSIS — M25552 Pain in left hip: Secondary | ICD-10-CM | POA: Diagnosis not present

## 2021-06-20 DIAGNOSIS — M25552 Pain in left hip: Secondary | ICD-10-CM | POA: Diagnosis not present

## 2021-06-20 DIAGNOSIS — M25562 Pain in left knee: Secondary | ICD-10-CM | POA: Diagnosis not present

## 2021-07-04 DIAGNOSIS — M25552 Pain in left hip: Secondary | ICD-10-CM | POA: Diagnosis not present

## 2021-07-04 DIAGNOSIS — M25562 Pain in left knee: Secondary | ICD-10-CM | POA: Diagnosis not present

## 2021-07-11 DIAGNOSIS — M25552 Pain in left hip: Secondary | ICD-10-CM | POA: Diagnosis not present

## 2021-07-11 DIAGNOSIS — M25562 Pain in left knee: Secondary | ICD-10-CM | POA: Diagnosis not present

## 2021-09-20 ENCOUNTER — Telehealth: Payer: Self-pay | Admitting: Internal Medicine

## 2021-09-20 NOTE — Telephone Encounter (Signed)
Pt is requesting a call from the nurse. Pt stated she has a terrible cough. She is congested. And she has laryngitis. Pt would like to know if there is anything she can take to ease the symptoms.   Please advise

## 2021-09-21 DIAGNOSIS — Z03818 Encounter for observation for suspected exposure to other biological agents ruled out: Secondary | ICD-10-CM | POA: Diagnosis not present

## 2021-09-21 DIAGNOSIS — H109 Unspecified conjunctivitis: Secondary | ICD-10-CM | POA: Diagnosis not present

## 2021-09-21 DIAGNOSIS — R059 Cough, unspecified: Secondary | ICD-10-CM | POA: Diagnosis not present

## 2021-09-21 NOTE — Telephone Encounter (Signed)
Patient called back this morning and would like someone to call her today.

## 2021-09-21 NOTE — Telephone Encounter (Signed)
Message has been sent to provider.

## 2021-09-25 NOTE — Telephone Encounter (Signed)
I am sorry.  Please do COVID test Use DayQuil/NyQuil as needed Robitussin as needed Office visit if problems. Thanks

## 2021-09-26 NOTE — Telephone Encounter (Signed)
Called pt gave her MD response. Pt states she is very upset that she did not get call back. She states she called Aug 1 st Tuesday & Wednesday. Never got return call so [facility] and is feeling better.Marland KitchenJohny Chess

## 2021-11-22 ENCOUNTER — Ambulatory Visit: Payer: Medicare Other | Admitting: Dermatology

## 2021-11-24 ENCOUNTER — Telehealth: Payer: Self-pay | Admitting: Internal Medicine

## 2021-11-24 MED ORDER — LORAZEPAM 1 MG PO TABS: ORAL_TABLET | ORAL | 3 refills | Status: DC

## 2021-11-24 NOTE — Telephone Encounter (Signed)
Check [place] registry last filled 06/18/2021. MD is out of the office until Monday 11/28/21 pls advise../lm,b

## 2021-11-24 NOTE — Telephone Encounter (Signed)
OK. Thx

## 2021-11-24 NOTE — Telephone Encounter (Signed)
Patient needs a refill on her lorazepam  1 mg.  Please send to [facility] - [place]

## 2021-12-05 DIAGNOSIS — H5203 Hypermetropia, bilateral: Secondary | ICD-10-CM | POA: Diagnosis not present

## 2022-01-30 ENCOUNTER — Other Ambulatory Visit: Payer: Self-pay | Admitting: Family Medicine

## 2022-01-30 ENCOUNTER — Ambulatory Visit (INDEPENDENT_AMBULATORY_CARE_PROVIDER_SITE_OTHER): Payer: Medicare Other | Admitting: Family Medicine

## 2022-01-30 ENCOUNTER — Encounter: Payer: Self-pay | Admitting: Family Medicine

## 2022-01-30 ENCOUNTER — Ambulatory Visit: Payer: Medicare Other | Admitting: Internal Medicine

## 2022-01-30 VITALS — BP 114/70 | HR 70 | Temp 97.9°F | Wt 117.8 lb

## 2022-01-30 DIAGNOSIS — R051 Acute cough: Secondary | ICD-10-CM | POA: Diagnosis not present

## 2022-01-30 DIAGNOSIS — J0110 Acute frontal sinusitis, unspecified: Secondary | ICD-10-CM

## 2022-01-30 MED ORDER — FLUTICASONE PROPIONATE 50 MCG/ACT NA SUSP: 2.0000 | Freq: Every day | NASAL | 6 refills | Status: DC

## 2022-01-30 MED ORDER — BENZONATATE 100 MG PO CAPS: 100.0000 mg | ORAL_CAPSULE | Freq: Two times a day (BID) | ORAL | 0 refills | Status: DC | PRN

## 2022-01-30 MED ORDER — AZITHROMYCIN 250 MG PO TABS: ORAL_TABLET | ORAL | 0 refills | Status: AC

## 2022-01-30 NOTE — Progress Notes (Signed)
Assessment/Plan:   Problem List Items Addressed This Visit       Other   Cough - Primary   Relevant Medications   fluticasone (FLONASE) 50 MCG/ACT nasal spray   benzonatate (TESSALON) 100 MG capsule   azithromycin (ZITHROMAX) 250 MG tablet   Other Visit Diagnoses     Acute non-recurrent frontal sinusitis       Relevant Medications   fluticasone (FLONASE) 50 MCG/ACT nasal spray   benzonatate (TESSALON) 100 MG capsule   azithromycin (ZITHROMAX) 250 MG tablet      Given change in symptomatology over the past 4 days, particular with regards to frontal sinus and yellow mucus, concern for frontal sinusitis.  Will treat with azithromycin and fluticasone.  Tessalon Perles for ongoing cough.    Subjective:  HPI:  Lindsay Maldonado is a 87 y.o. female who has GOITER, MULTINODULAR; Hypothyroidism; Fibromyalgia; OSTEOPENIA; WEIGHT LOSS; Cough; CERVICAL STRAIN; Elbow pain, left; Well adult exam; HTN (hypertension); Actinic keratoses; Chest wall pain; Right LBP; Grief; Radiculitis of leg; Headache; Bilateral hearing loss; Sinus pressure; Decreased GFR; Seborrheic keratoses; Claustrophobia; Renal cyst; Hypokalemia; Plantar fasciitis; Piriformis syndrome of left side; Cervical pain (neck); Osteoarthritis; and History of COVID-19 on their problem list..   She  has a past medical history of Cyst of breast, Fibromyalgia, Goiter, Hypothyroidism, and Osteopenia..   She presents with chief complaint of Cold (Congestion , yellow mucus, body ache, headache,some nausea x weeks. Neg covid home test x 1 week ago. ) .   Cough: Patient complains of nasal congestion and headache and nausea .  Symptoms began 2 week ago.  Reports that she has developed yellow mucus nasal drainage over th past 4 days.  Her headaches are located in the frontal sinus area.  They are bilateral.  The cough is non-productive, without wheezing, chest pain, dyspnea or hemoptysis.  At the beginning of symptoms, patient had been on a  course of prednisone for lower back and joint pain.  The steroids overlapped cough/cold for about 3 to 4 days.  This did not change symptoms.  Patient has been trying OTC medications without improvement.  Past Surgical History:  Procedure Laterality Date   BREAST EXCISIONAL BIOPSY Left    benign   BREAST EXCISIONAL BIOPSY Right    benign   ENDOMETRIAL BIOPSY  2008   Dr. Clint Bolder CUFF REPAIR     Left   THYROID SURGERY  2007   Nodule removed    Outpatient Medications Prior to Visit  Medication Sig Dispense Refill   Cholecalciferol (VITAMIN D3) 50 MCG (2000 UT) capsule Take 1 capsule (2,000 Units total) by mouth daily. 100 capsule 3   LORazepam (ATIVAN) 1 MG tablet TAKE ONE TABLET BY MOUTH TWICE A DAY AS NEEDED FOR ANXIETY OR SLEEP 60 tablet 3   SYNTHROID 75 MCG tablet Take 1 tablet (75 mcg total) by mouth daily. 90 tablet 3   triamterene-hydrochlorothiazide (MAXZIDE-25) 37.5-25 MG tablet Take 0.5 tablets by mouth daily. Annual appt is overdue must see provider for future refills 45 tablet 3   No facility-administered medications prior to visit.    Family History  Problem Relation Age of Onset   Cancer Mother 65       breast   Parkinsonism Father     Social History   Socioeconomic History   Marital status: Widowed    Spouse name: Not on file   Number of children: 5   Years of education: Not on file   Highest  education level: Not on file  Occupational History   Occupation: housewife  Tobacco Use   Smoking status: Never   Smokeless tobacco: Never  Substance and Sexual Activity   Alcohol use: No   Drug use: Never   Sexual activity: Not on file  Other Topics Concern   Not on file  Social History Narrative   Physician Roster:   Dr. Arlyce Dice   Dr. Ewing Schlein   Social Determinants of Health   Financial Resource Strain: Low Risk  (03/28/2021)   Overall Financial Resource Strain (CARDIA)    Difficulty of Paying Living Expenses: Not hard at all  Food Insecurity: No  Food Insecurity (03/28/2021)   Hunger Vital Sign    Worried About Running Out of Food in the Last Year: Never true    Ran Out of Food in the Last Year: Never true  Transportation Needs: No Transportation Needs (03/28/2021)   PRAPARE - Administrator, Civil Service (Medical): No    Lack of Transportation (Non-Medical): No  Physical Activity: Inactive (03/28/2021)   Exercise Vital Sign    Days of Exercise per Week: 0 days    Minutes of Exercise per Session: 0 min  Stress: No Stress Concern Present (03/28/2021)   [employer] of Occupational Health - Occupational Stress Questionnaire    Feeling of Stress : Not at all  Social Connections: Moderately Integrated (03/28/2021)   Social Connection and Isolation Panel [NHANES]    Frequency of Communication with Friends and Family: More than three times a week    Frequency of Social Gatherings with Friends and Family: Once a week    Attends Religious Services: More than 4 times per year    Active Member of [employer] or Organizations: Yes    Attends Banker Meetings: More than 4 times per year    Marital Status: Widowed  Intimate Partner Violence: Not At Risk (03/28/2021)   Humiliation, Afraid, Rape, and Kick questionnaire    Fear of Current or Ex-Partner: No    Emotionally Abused: No    Physically Abused: No    Sexually Abused: No                                                                                                 Objective:  Physical Exam: BP 114/70 (BP Location: Left Arm, Patient Position: Sitting, Cuff Size: Large)   Pulse 70   Temp 97.9 F (36.6 C) (Temporal)   Wt 117 lb 12.8 oz (53.4 kg)   SpO2 100%   BMI 20.87 kg/m    General: No acute distress. Awake and conversant.  Eyes: Normal conjunctiva, anicteric. Round symmetric pupils.  ENT: Hearing grossly intact. No nasal discharge.  Bilateral tympanic membranes normal, MMM, no oropharyngeal lesions Neck: Neck is supple. No masses or thyromegaly.   Respiratory: Respirations are non-labored.  CTAB Skin: Warm. No rashes or ulcers.  Psych: Alert and oriented. Cooperative, Appropriate mood and affect, Normal judgment.  CV: No cyanosis or JVD, RRR no MRG MSK: Normal ambulation. No clubbing  Neuro: Sensation and CN II-XII grossly normal.  Garner Nash, MD, MS

## 2022-03-21 ENCOUNTER — Telehealth: Payer: Self-pay | Admitting: Internal Medicine

## 2022-03-21 MED ORDER — SYNTHROID 75 MCG PO TABS: 75.0000 ug | ORAL_TABLET | Freq: Every day | ORAL | 0 refills | Status: DC

## 2022-03-21 NOTE — Telephone Encounter (Signed)
Pt is due for cpz in Feb sent #[phone] until appt.Marland KitchenJohny Maldonado

## 2022-03-21 NOTE — Telephone Encounter (Signed)
MEDICATION:SYNTHROID 75 MCG tablet   PHARMACY:[facility] # [place], [place]   Comments:   **Let patient know to contact pharmacy at the end of the day to make sure medication is ready. **  ** Please notify patient to allow 48-72 hours to process**  **Encourage patient to contact the pharmacy for refills or they can request refills through [facility]**    Wants bloodwork orders put in prior to physical appointment on 04/10/22. Wants Vitamin D checked as well.

## 2022-04-01 ENCOUNTER — Other Ambulatory Visit: Payer: Self-pay | Admitting: Internal Medicine

## 2022-04-04 ENCOUNTER — Telehealth: Payer: Self-pay | Admitting: Internal Medicine

## 2022-04-04 DIAGNOSIS — E559 Vitamin D deficiency, unspecified: Secondary | ICD-10-CM

## 2022-04-04 DIAGNOSIS — E785 Hyperlipidemia, unspecified: Secondary | ICD-10-CM

## 2022-04-04 DIAGNOSIS — R944 Abnormal results of kidney function studies: Secondary | ICD-10-CM

## 2022-04-04 DIAGNOSIS — Z0000 Encounter for general adult medical examination without abnormal findings: Secondary | ICD-10-CM

## 2022-04-04 NOTE — Telephone Encounter (Signed)
Pt has [place] Medicare for insurance pls advise,.Marland KitchenJohny Maldonado

## 2022-04-04 NOTE — Telephone Encounter (Signed)
Patient is scheduled for CPE on 2/19 - wants to have labs drawn tomorrow ahead of her visit. She would also like to have her vitamin D checked as well as typical CPE labs. Please call patient when orders are placed at [phone].

## 2022-04-05 ENCOUNTER — Other Ambulatory Visit (INDEPENDENT_AMBULATORY_CARE_PROVIDER_SITE_OTHER): Payer: Medicare Other

## 2022-04-05 DIAGNOSIS — E559 Vitamin D deficiency, unspecified: Secondary | ICD-10-CM | POA: Diagnosis not present

## 2022-04-05 DIAGNOSIS — E785 Hyperlipidemia, unspecified: Secondary | ICD-10-CM

## 2022-04-05 DIAGNOSIS — Z0000 Encounter for general adult medical examination without abnormal findings: Secondary | ICD-10-CM | POA: Diagnosis not present

## 2022-04-05 DIAGNOSIS — R944 Abnormal results of kidney function studies: Secondary | ICD-10-CM

## 2022-04-05 LAB — URINALYSIS
Bilirubin Urine: NEGATIVE
Hgb urine dipstick: NEGATIVE
Ketones, ur: NEGATIVE
Leukocytes,Ua: NEGATIVE
Nitrite: NEGATIVE
Specific Gravity, Urine: 1.015 (ref 1.000–1.030)
Total Protein, Urine: NEGATIVE
Urine Glucose: NEGATIVE
Urobilinogen, UA: 0.2 (ref 0.0–1.0)
pH: 7.5 (ref 5.0–8.0)

## 2022-04-05 LAB — CBC WITH DIFFERENTIAL/PLATELET
Basophils Absolute: 0 10*3/uL (ref 0.0–0.1)
Basophils Relative: 0.5 % (ref 0.0–3.0)
Eosinophils Absolute: 0.2 10*3/uL (ref 0.0–0.7)
Eosinophils Relative: 4.1 % (ref 0.0–5.0)
HCT: 41 % (ref 36.0–46.0)
Hemoglobin: 13.6 g/dL (ref 12.0–15.0)
Lymphocytes Relative: 21.8 % (ref 12.0–46.0)
Lymphs Abs: 0.8 10*3/uL (ref 0.7–4.0)
MCHC: 33.2 g/dL (ref 30.0–36.0)
MCV: 89.1 fl (ref 78.0–100.0)
Monocytes Absolute: 0.3 10*3/uL (ref 0.1–1.0)
Monocytes Relative: 7.9 % (ref 3.0–12.0)
Neutro Abs: 2.5 10*3/uL (ref 1.4–7.7)
Neutrophils Relative %: 65.7 % (ref 43.0–77.0)
Platelets: 202 10*3/uL (ref 150.0–400.0)
RBC: 4.6 Mil/uL (ref 3.87–5.11)
RDW: 13.9 % (ref 11.5–15.5)
WBC: 3.8 10*3/uL — ABNORMAL LOW (ref 4.0–10.5)

## 2022-04-05 LAB — LIPID PANEL
Cholesterol: 209 mg/dL — ABNORMAL HIGH (ref 0–200)
HDL: 88.2 mg/dL (ref >39.00)
LDL Cholesterol: 107 mg/dL — ABNORMAL HIGH (ref 0–99)
NonHDL: 120.65
Total CHOL/HDL Ratio: 2
Triglycerides: 66 mg/dL (ref 0.0–149.0)
VLDL: 13.2 mg/dL (ref 0.0–40.0)

## 2022-04-05 LAB — COMPREHENSIVE METABOLIC PANEL
ALT: 19 U/L (ref 0–35)
AST: 19 U/L (ref 0–37)
Albumin: 4 g/dL (ref 3.5–5.2)
Alkaline Phosphatase: 64 U/L (ref 39–117)
BUN: 12 mg/dL (ref 6–23)
CO2: 29 mEq/L (ref 19–32)
Calcium: 9.4 mg/dL (ref 8.4–10.5)
Chloride: 101 mEq/L (ref 96–112)
Creatinine, Ser: 0.84 mg/dL (ref 0.40–1.20)
GFR: 63.13 mL/min (ref >60.00)
Glucose, Bld: 89 mg/dL (ref 70–99)
Potassium: 3.7 mEq/L (ref 3.5–5.1)
Sodium: 137 mEq/L (ref 135–145)
Total Bilirubin: 0.5 mg/dL (ref 0.2–1.2)
Total Protein: 6.6 g/dL (ref 6.0–8.3)

## 2022-04-05 LAB — TSH: TSH: 2.56 u[IU]/mL (ref 0.35–5.50)

## 2022-04-05 LAB — VITAMIN D 25 HYDROXY (VIT D DEFICIENCY, FRACTURES): VITD: 25.63 ng/mL — ABNORMAL LOW (ref 30.00–100.00)

## 2022-04-05 NOTE — Telephone Encounter (Signed)
Labs ordered. Thanks!

## 2022-04-05 NOTE — Telephone Encounter (Signed)
Notified pt MD ok labs pt was here in the lab.Marland KitchenJohny Chess

## 2022-04-10 ENCOUNTER — Encounter: Payer: Self-pay | Admitting: Internal Medicine

## 2022-04-10 ENCOUNTER — Ambulatory Visit (INDEPENDENT_AMBULATORY_CARE_PROVIDER_SITE_OTHER): Payer: Medicare Other

## 2022-04-10 ENCOUNTER — Ambulatory Visit (INDEPENDENT_AMBULATORY_CARE_PROVIDER_SITE_OTHER): Payer: Medicare Other | Admitting: Internal Medicine

## 2022-04-10 VITALS — Ht 63.0 in

## 2022-04-10 VITALS — BP 132/76 | HR 105 | Temp 97.6°F | Ht 63.0 in | Wt 120.0 lb

## 2022-04-10 DIAGNOSIS — Z0000 Encounter for general adult medical examination without abnormal findings: Secondary | ICD-10-CM | POA: Diagnosis not present

## 2022-04-10 DIAGNOSIS — G6289 Other specified polyneuropathies: Secondary | ICD-10-CM

## 2022-04-10 DIAGNOSIS — G629 Polyneuropathy, unspecified: Secondary | ICD-10-CM | POA: Insufficient documentation

## 2022-04-10 MED ORDER — SYNTHROID 75 MCG PO TABS: 75.0000 ug | ORAL_TABLET | Freq: Every day | ORAL | 3 refills | Status: DC

## 2022-04-10 MED ORDER — TRIAMTERENE-HCTZ 37.5-25 MG PO TABS: ORAL_TABLET | ORAL | 3 refills | Status: AC

## 2022-04-10 MED ORDER — VITAMIN D (ERGOCALCIFEROL) 1.25 MG (50000 UNIT) PO CAPS: 50000.0000 [IU] | ORAL_CAPSULE | ORAL | 0 refills | Status: DC

## 2022-04-10 MED ORDER — TRIAMTERENE-HCTZ 37.5-25 MG PO TABS: ORAL_TABLET | ORAL | 0 refills | Status: DC

## 2022-04-10 MED ORDER — LORAZEPAM 1 MG PO TABS: ORAL_TABLET | ORAL | 3 refills | Status: DC

## 2022-04-10 NOTE — Assessment & Plan Note (Signed)
We discussed age appropriate health related issues, including available/recomended screening tests and vaccinations. Labs were ordered to be later reviewed . All questions were answered. We discussed one or more of the following - seat belt use, use of sunscreen/sun exposure exercise, fall risk reduction, second hand smoke exposure, firearm use and storage, seat belt use, a need for adhering to healthy diet and exercise. Labs were ordered.  All questions were answered.  CT ca scoring info 2019

## 2022-04-10 NOTE — Progress Notes (Addendum)
Subjective:   Lindsay Maldonado is a 88 y.o. female who presents for Medicare Annual (Subsequent) preventive examination.  Review of Systems    No ROS. Medicare Wellness Visit. Additional risk factors are reflected in social history. Cardiac Risk Factors include: advanced age (>103mn, >>36women);hypertension     Objective:    Today's Vitals   04/10/22 0947 04/10/22 1014  BP: 132/76   Pulse: (!) 105   Temp: 97.6 F (36.4 C)   TempSrc: Temporal   SpO2: 98%   Weight: 120 lb (54.4 kg)   Height: '5\' 3"'$  (1.6 m)   PainSc:  4    Body mass index is 21.26 kg/m.     04/10/2022   10:23 AM 03/28/2021   10:06 AM 11/26/2020   11:09 AM 06/20/2020    9:54 AM 03/15/2020   10:38 AM 10/12/2015    8:46 AM  Advanced Directives  Does Patient Have a Medical Advance Directive? Yes Yes Yes Yes Yes Yes  Type of AParamedicof [place] will Living will;Healthcare Power of [place] will Living will;Healthcare Power of Attorney Living will;Healthcare Power of Attorney Living will  Does patient want to make changes to medical advance directive? No - Patient declined No - Patient declined No - Patient declined No - Patient declined No - Patient declined   Copy of HAllynin Chart? No - copy requested No - copy requested  No - copy requested, Physician notified No - copy requested   Would patient like information on creating a medical advance directive?   No - Patient declined No - Guardian declined      Current Medications (verified) Outpatient Encounter Medications as of 04/10/2022  Medication Sig   LORazepam (ATIVAN) 1 MG tablet TAKE ONE TABLET BY MOUTH TWICE A DAY AS NEEDED FOR ANXIETY OR SLEEP   SYNTHROID 75 MCG tablet Take 1 tablet (75 mcg total) by mouth daily. Annual appt due in Feb must see provider for future refills   triamterene-hydrochlorothiazide (MAXZIDE-25) 37.5-25 MG tablet TAKE HALF TABLET BY MOUTH DAILY  *OFFICE VISIT NEEDED FOR FURTHER REFILLS*   Cholecalciferol (VITAMIN D3) 50 MCG (2000 UT) capsule Take 1 capsule (2,000 Units total) by mouth daily. (Patient not taking: Reported on 04/10/2022)   [DISCONTINUED] benzonatate (TESSALON) 100 MG capsule Take 1 capsule (100 mg total) by mouth 2 (two) times daily as needed for cough. (Patient not taking: Reported on 04/10/2022)   [DISCONTINUED] fluticasone (FLONASE) 50 MCG/ACT nasal spray Place 2 sprays into both nostrils daily. (Patient not taking: Reported on 04/10/2022)   No facility-administered encounter medications on file as of 04/10/2022.    Allergies (verified) Morphine   History: Past Medical History:  Diagnosis Date   Cyst of breast    Fibromyalgia    Goiter    multinodular   Hypothyroidism    Osteopenia    Past Surgical History:  Procedure Laterality Date   BREAST EXCISIONAL BIOPSY Left    benign   BREAST EXCISIONAL BIOPSY Right    benign   ENDOMETRIAL BIOPSY  2008   Dr. KLoma SousaCUFF REPAIR     Left   THYROID SURGERY  2007   Nodule removed   Family History  Problem Relation Age of Onset   Cancer Mother 773      breast   Parkinsonism Father    Social History   Socioeconomic History   Marital status: Widowed    Spouse name: Not  on file   Number of children: 5   Years of education: Not on file   Highest education level: Not on file  Occupational History   Occupation: housewife  Tobacco Use   Smoking status: Never   Smokeless tobacco: Never  Substance and Sexual Activity   Alcohol use: No   Drug use: Never   Sexual activity: Not on file  Other Topics Concern   Not on file  Social History Narrative   Physician Roster:   Dr. Deatra Ina   Dr. Watt Climes   Social Determinants of Health   Financial Resource Strain: Low Risk  (04/10/2022)   Overall Financial Resource Strain (CARDIA)    Difficulty of Paying Living Expenses: Not hard at all  Food Insecurity: No Food Insecurity (04/10/2022)   Hunger Vital Sign     Worried About Running Out of Food in the Last Year: Never true    [place] in the Last Year: Never true  Transportation Needs: No Transportation Needs (04/10/2022)   PRAPARE - Hydrologist (Medical): No    Lack of Transportation (Non-Medical): No  Physical Activity: Sufficiently Active (04/10/2022)   Exercise Vital Sign    Days of Exercise per Week: 3 days    Minutes of Exercise per Session: 150+ min  Stress: No Stress Concern Present (04/10/2022)   [place]    Feeling of Stress : Not at all  Social Connections: Moderately Integrated (04/10/2022)   Social Connection and Isolation Panel [NHANES]    Frequency of Communication with Friends and Family: More than three times a week    Frequency of Social Gatherings with Friends and Family: More than three times a week    Attends Religious Services: More than 4 times per year    Active Member of [employer] or Organizations: Yes    Attends Archivist Meetings: More than 4 times per year    Marital Status: Widowed    Tobacco Counseling Counseling given: Not Answered   Clinical Intake:  Pre-visit preparation completed: Yes  Pain : 0-10 Pain Score: 4  Pain Type: Chronic pain Pain Location: Knee Pain Orientation: Right, Left, Anterior Pain Descriptors / Indicators: Aching, Sharp, Shooting, Stabbing Pain Onset: More than a month ago Pain Frequency: Intermittent Pain Relieving Factors: sitting  Pain Relieving Factors: sitting  BMI - recorded: 21 Nutritional Status: BMI of 19-24  Normal Nutritional Risks: None Diabetes: No  How often do you need to have someone help you when you read instructions, pamphlets, or other written materials from your doctor or pharmacy?: 1 - Never What is the last grade level you completed in school?: 12th grade  Interpreter Needed?: No  Information entered by :: Jillene Bucks,  CMA   Activities of Daily Living    04/10/2022   10:24 AM  In your present state of health, do you have any difficulty performing the following activities:  Hearing? 0  Vision? 0  Difficulty concentrating or making decisions? 0  Walking or climbing stairs? 0  Dressing or bathing? 0  Doing errands, shopping? 0  Preparing Food and eating ? N  Using the Toilet? N  In the past six months, have you accidently leaked urine? N  Do you have problems with loss of bowel control? N  Managing your Medications? N  Managing your Finances? N  Housekeeping or managing your Housekeeping? N    Patient Care Team: Plotnikov, Evie Lacks, MD as PCP -  General Alden Hipp, MD (Obstetrics and Gynecology) Clarene Essex, MD (Gastroenterology) Darleen Crocker, MD as Consulting Physician (Ophthalmology) Webb Laws, [place] as Referring Physician (Optometry) Lavonna Monarch, MD (Inactive) as Consulting Physician (Dermatology)  Indicate any recent Medical Services you may have received from other than [facility] providers in the past year (date may be approximate).     Assessment:   This is a routine wellness examination for [place].  Hearing/Vision screen Patient denied any hearing difficulty. No hearing aids. Patient does not wear any corrective lenses/contacts.  Dietary issues and exercise activities discussed: Current Exercise Habits: Home exercise routine, Type of exercise: walking;Other - see comments, Time (Minutes): > 60, Frequency (Times/Week): 3, Weekly Exercise (Minutes/Week): 0, Intensity: Mild, Exercise limited by: None identified   Goals Addressed             This Visit's Progress    Patient Stated       I would like to continue to stay healthy and push myself to go back to the [facility].       Depression Screen    04/10/2022    9:52 AM 03/29/2021    9:23 AM 03/28/2021   10:12 AM 03/22/2020   10:20 AM 03/15/2020   10:55 AM 01/14/2019    9:13 AM 01/07/2018    1:37 PM  PHQ 2/9 Scores   PHQ - 2 Score 0 0 0 0 0 0 0    Fall Risk    04/10/2022    9:52 AM 03/29/2021    9:23 AM 03/28/2021   10:12 AM 03/22/2020   10:20 AM 03/15/2020   10:40 AM  Fall Risk   Falls in the past year? 0 0 0 0 1  Number falls in past yr: 0 0 0 0 0  Injury with Fall? 0 0 0 0 0  Risk for fall due to : No Fall Risks  No Fall Risks No Fall Risks   Follow up Falls evaluation completed  Falls evaluation completed      FALL RISK PREVENTION PERTAINING TO THE HOME:  Any stairs in or around the home? Yes  If so, are there any without handrails? No  Home free of loose throw rugs in walkways, pet beds, electrical cords, etc? Yes  Adequate lighting in your home to reduce risk of falls? Yes   ASSISTIVE DEVICES UTILIZED TO PREVENT FALLS:  Life alert? Yes  Use of a cane, walker or w/c? No  Grab bars in the bathroom? No  Shower chair or bench in shower? Yes  Elevated toilet seat or a handicapped toilet? No   TIMED UP AND GO:  Was the test performed? No .  Length of time to ambulate 10 feet: N/A sec.   Patient stated that she has no issues with gait or balance; does not use any assistive devices.  Cognitive Function:  Patient is cogitatively intact.      04/10/2022   10:24 AM  6CIT Screen  What Year? 0 points  What month? 0 points  What time? 0 points  Count back from 20 0 points  Months in reverse 0 points  Repeat phrase 2 points  Total Score 2 points    Immunizations Immunization History  Administered Date(s) Administered   Influenza-Unspecified 12/21/2017   Moderna Sars-Covid-2 Vaccination 03/04/2019, 04/01/2019   Pneumococcal Conjugate-13 09/25/2013   Pneumococcal Polysaccharide-23 01/09/2005, 10/12/2015   Td 02/21/2004   Tdap 10/13/2016   Zoster, Live 04/29/2009    TDAP status: Up to date  Flu Vaccine status: Declined,  Education has been provided regarding the importance of this vaccine but patient still declined. Advised may receive this vaccine at local pharmacy or Health  Dept. Aware to provide a copy of the vaccination record if obtained from local pharmacy or Health Dept. Verbalized acceptance and understanding.  Pneumococcal vaccine status: Up to date  Covid-19 vaccine status: Completed vaccines  Qualifies for Shingles Vaccine? Yes   Zostavax completed No   Shingrix Completed?: No.    Education has been provided regarding the importance of this vaccine. Patient has been advised to call insurance company to determine out of pocket expense if they have not yet received this vaccine. Advised may also receive vaccine at local pharmacy or Health Dept. Verbalized acceptance and understanding.  Screening Tests Health Maintenance  Topic Date Due   Zoster Vaccines- Shingrix (1 of 2) Never done   COVID-19 Vaccine (3 - Moderna risk series) 04/26/2022 (Originally 04/29/2019)   Medicare Annual Wellness (AWV)  04/11/2023   DTaP/Tdap/Td (3 - Td or Tdap) 10/14/2026   Pneumonia Vaccine 67+ Years old  Completed   HPV VACCINES  Aged Out   INFLUENZA VACCINE  Discontinued   DEXA SCAN  Discontinued    Health Maintenance  Health Maintenance Due  Topic Date Due   Zoster Vaccines- Shingrix (1 of 2) Never done    Colorectal cancer screening: No longer required.   Mammogram status: No longer required due to age.  Bone Density status: Declined, has not done.   Lung Cancer Screening: (Low Dose CT Chest recommended if Age 60-80 years, 30 pack-year currently smoking OR have quit w/in 15years.) does not qualify.   Lung Cancer Screening Referral: N/A  Additional Screening:  Hepatitis C Screening: does not qualify; Completed N/A  Vision Screening: Recommended annual ophthalmology exams for early detection of glaucoma and other disorders of the eye. Is the patient up to date with their annual eye exam?  Yes  Who is the provider or what is the name of the office in which the patient attends annual eye exams? Dr. Einar Gip If pt is not established with a provider, would  they like to be referred to a provider to establish care? No .   Dental Screening: Recommended annual dental exams for proper oral hygiene  Community Resource Referral / Chronic Care Management: CRR required this visit?  No   CCM required this visit?  No      Plan:     I have personally reviewed and noted the following in the patient's chart:   Medical and social history Use of alcohol, tobacco or illicit drugs  Current medications and supplements including opioid prescriptions. Patient is not currently taking opioid prescriptions. Functional ability and status Nutritional status Physical activity Advanced directives List of other physicians Hospitalizations, surgeries, and ER visits in previous 12 months Vitals Screenings to include cognitive, depression, and falls Referrals and appointments  In addition, I have reviewed and discussed with patient certain preventive protocols, quality metrics, and best practice recommendations. A written personalized care plan for preventive services as well as general preventive health recommendations were provided to patient.     Rossie Muskrat, [place]   04/10/2022   Nurse Notes: N/A   Medical screening examination/treatment/procedure(s) were performed by non-physician practitioner and as supervising physician I was immediately available for consultation/collaboration.  I agree with above. Lew Dawes, MD

## 2022-04-10 NOTE — Patient Instructions (Signed)
Please schedule your next Medicare Wellness Visit with your Nurse Health Advisor in 1 year by calling [phone].

## 2022-04-10 NOTE — Patient Instructions (Addendum)
Spenco soles  HOKA shoes  [employer]  Blue-Emu cream -- use 2-3 times a day

## 2022-04-10 NOTE — Progress Notes (Signed)
Subjective:  Patient ID: Lindsay Maldonado, female    DOB: 03-30-1934  Age: 88 y.o. MRN: [id number]  CC: No chief complaint on file.   HPI [name] presents for a well exam C/o tingling in toes B   Outpatient Medications Prior to Visit  Medication Sig Dispense Refill   Cholecalciferol (VITAMIN D3) 50 MCG (2000 UT) capsule Take 1 capsule (2,000 Units total) by mouth daily. (Patient not taking: Reported on 04/10/2022) 100 capsule 3   LORazepam (ATIVAN) 1 MG tablet TAKE ONE TABLET BY MOUTH TWICE A DAY AS NEEDED FOR ANXIETY OR SLEEP 60 tablet 3   SYNTHROID 75 MCG tablet Take 1 tablet (75 mcg total) by mouth daily. Annual appt due in Feb must see provider for future refills 30 tablet 0   triamterene-hydrochlorothiazide (MAXZIDE-25) 37.5-25 MG tablet TAKE HALF TABLET BY MOUTH DAILY *OFFICE VISIT NEEDED FOR FURTHER REFILLS* 45 tablet 0   No facility-administered medications prior to visit.    ROS: Review of Systems  Constitutional:  Negative for activity change, appetite change, chills, fatigue and unexpected weight change.  HENT:  Negative for congestion, mouth sores and sinus pressure.   Eyes:  Negative for visual disturbance.  Respiratory:  Negative for cough and chest tightness.   Gastrointestinal:  Negative for abdominal pain and nausea.  Genitourinary:  Negative for difficulty urinating, frequency and vaginal pain.  Musculoskeletal:  Negative for back pain and gait problem.  Skin:  Negative for pallor and rash.  Neurological:  Positive for numbness. Negative for dizziness, tremors, weakness and headaches.  Psychiatric/Behavioral:  Negative for confusion and sleep disturbance. The patient is not nervous/anxious.     Objective:  Ht 5' 3"$  (1.6 m)   BMI 21.26 kg/m   BP Readings from Last 3 Encounters:  04/10/22 132/76  01/30/22 114/70  03/29/21 128/76    Wt Readings from Last 3 Encounters:  04/10/22 120 lb (54.4 kg)  01/30/22 117 lb 12.8 oz (53.4 kg)  03/29/21 117  lb (53.1 kg)    Physical Exam Constitutional:      General: She is not in acute distress.    Appearance: She is well-developed.  HENT:     Head: Normocephalic.     Right Ear: External ear normal.     Left Ear: External ear normal.     Nose: Nose normal.  Eyes:     General:        Right eye: No discharge.        Left eye: No discharge.     Conjunctiva/sclera: Conjunctivae normal.     Pupils: Pupils are equal, round, and reactive to light.  Neck:     Thyroid: No thyromegaly.     Vascular: No JVD.     Trachea: No tracheal deviation.  Cardiovascular:     Rate and Rhythm: Normal rate and regular rhythm.     Heart sounds: Normal heart sounds.  Pulmonary:     Effort: No respiratory distress.     Breath sounds: No stridor. No wheezing.  Abdominal:     General: Bowel sounds are normal. There is no distension.     Palpations: Abdomen is soft. There is no mass.     Tenderness: There is no abdominal tenderness. There is no guarding or rebound.  Musculoskeletal:        General: No tenderness.     Cervical back: Normal range of motion and neck supple. No rigidity.  Lymphadenopathy:     Cervical: No cervical adenopathy.  Skin:    Findings: No erythema or rash.  Neurological:     Cranial Nerves: No cranial nerve deficit.     Motor: No abnormal muscle tone.     Coordination: Coordination normal.     Deep Tendon Reflexes: Reflexes normal.  Psychiatric:        Behavior: Behavior normal.        Thought Content: Thought content normal.        Judgment: Judgment normal.     Lab Results  Component Value Date   WBC 3.8 (L) 04/05/2022   HGB 13.6 04/05/2022   HCT 41.0 04/05/2022   PLT 202.0 04/05/2022   GLUCOSE 89 04/05/2022   CHOL 209 (H) 04/05/2022   TRIG 66.0 04/05/2022   HDL 88.20 04/05/2022   LDLCALC 107 (H) 04/05/2022   ALT 19 04/05/2022   AST 19 04/05/2022   NA 137 04/05/2022   K 3.7 04/05/2022   CL 101 04/05/2022   CREATININE 0.84 04/05/2022   BUN 12 04/05/2022    CO2 29 04/05/2022   TSH 2.56 04/05/2022    DG Knee 3 Views Left  Result Date: 03/01/2021 CLINICAL DATA:  Left knee pain EXAM: LEFT KNEE - 3 VIEW COMPARISON:  None. FINDINGS: No evidence of fracture, dislocation, or joint effusion. No evidence of arthropathy or other focal bone abnormality. Soft tissues are unremarkable. IMPRESSION: No significant degenerative change. Electronically Signed   By: Yvonne Kendall   On: 03/01/2021 18:17   DG HIP UNILAT WITH PELVIS 2-3 VIEWS LEFT  Result Date: 03/01/2021 CLINICAL DATA:  Left hip pain. EXAM: DG HIP (WITH OR WITHOUT PELVIS) 2-3V LEFT COMPARISON:  None. FINDINGS: There is no evidence of hip fracture or dislocation. There is no evidence of arthropathy or other focal bone abnormality. IMPRESSION: Negative. Electronically Signed   By: Ronney Asters M.D.   On: 03/01/2021 18:14    Assessment & Plan:   Problem List Items Addressed This Visit       Nervous and Auditory   Peripheral neuropathy    New - toes B Blue-Emu cream was recommended to use 2-3 times a day B complex      Relevant Medications   LORazepam (ATIVAN) 1 MG tablet     Other   Well adult exam - Primary    We discussed age appropriate health related issues, including available/recomended screening tests and vaccinations. Labs were ordered to be later reviewed . All questions were answered. We discussed one or more of the following - seat belt use, use of sunscreen/sun exposure exercise, fall risk reduction, second hand smoke exposure, firearm use and storage, seat belt use, a need for adhering to healthy diet and exercise. Labs were ordered.  All questions were answered.  CT ca scoring info 2019         Meds ordered this encounter  Medications   Vitamin D, Ergocalciferol, (DRISDOL) 1.25 MG (50000 UNIT) CAPS capsule    Sig: Take 1 capsule (50,000 Units total) by mouth every 7 (seven) days.    Dispense:  6 capsule    Refill:  0   LORazepam (ATIVAN) 1 MG tablet    Sig: TAKE ONE  TABLET BY MOUTH TWICE A DAY AS NEEDED FOR ANXIETY OR SLEEP    Dispense:  60 tablet    Refill:  3   SYNTHROID 75 MCG tablet    Sig: Take 1 tablet (75 mcg total) by mouth daily. Annual appt due in Feb must see provider for future refills  Dispense:  90 tablet    Refill:  3   DISCONTD: triamterene-hydrochlorothiazide (MAXZIDE-25) 37.5-25 MG tablet    Sig: TAKE HALF TABLET BY MOUTH DAILY *OFFICE VISIT NEEDED FOR FURTHER REFILLS*    Dispense:  45 tablet    Refill:  0    Prescriber changed. Previous call reason Refill Request.   triamterene-hydrochlorothiazide (MAXZIDE-25) 37.5-25 MG tablet    Sig: TAKE HALF TABLET BY MOUTH DAILY    Dispense:  45 tablet    Refill:  3      Follow-up: Return in about 6 months (around 10/09/2022) for a follow-up visit.  Walker Kehr, MD

## 2022-04-10 NOTE — Assessment & Plan Note (Signed)
Treat Vit D Def

## 2022-04-10 NOTE — Assessment & Plan Note (Signed)
New - toes B Blue-Emu cream was recommended to use 2-3 times a day B complex

## 2022-04-14 IMAGING — DX DG ABDOMEN ACUTE W/ 1V CHEST
3 series · 3 of 3 positions shown · non-contrast
Comparison: None.

CLINICAL DATA: Epigastric pain.

EXAM:
DG ABDOMEN ACUTE WITH 1 VIEW CHEST

[chest pa]
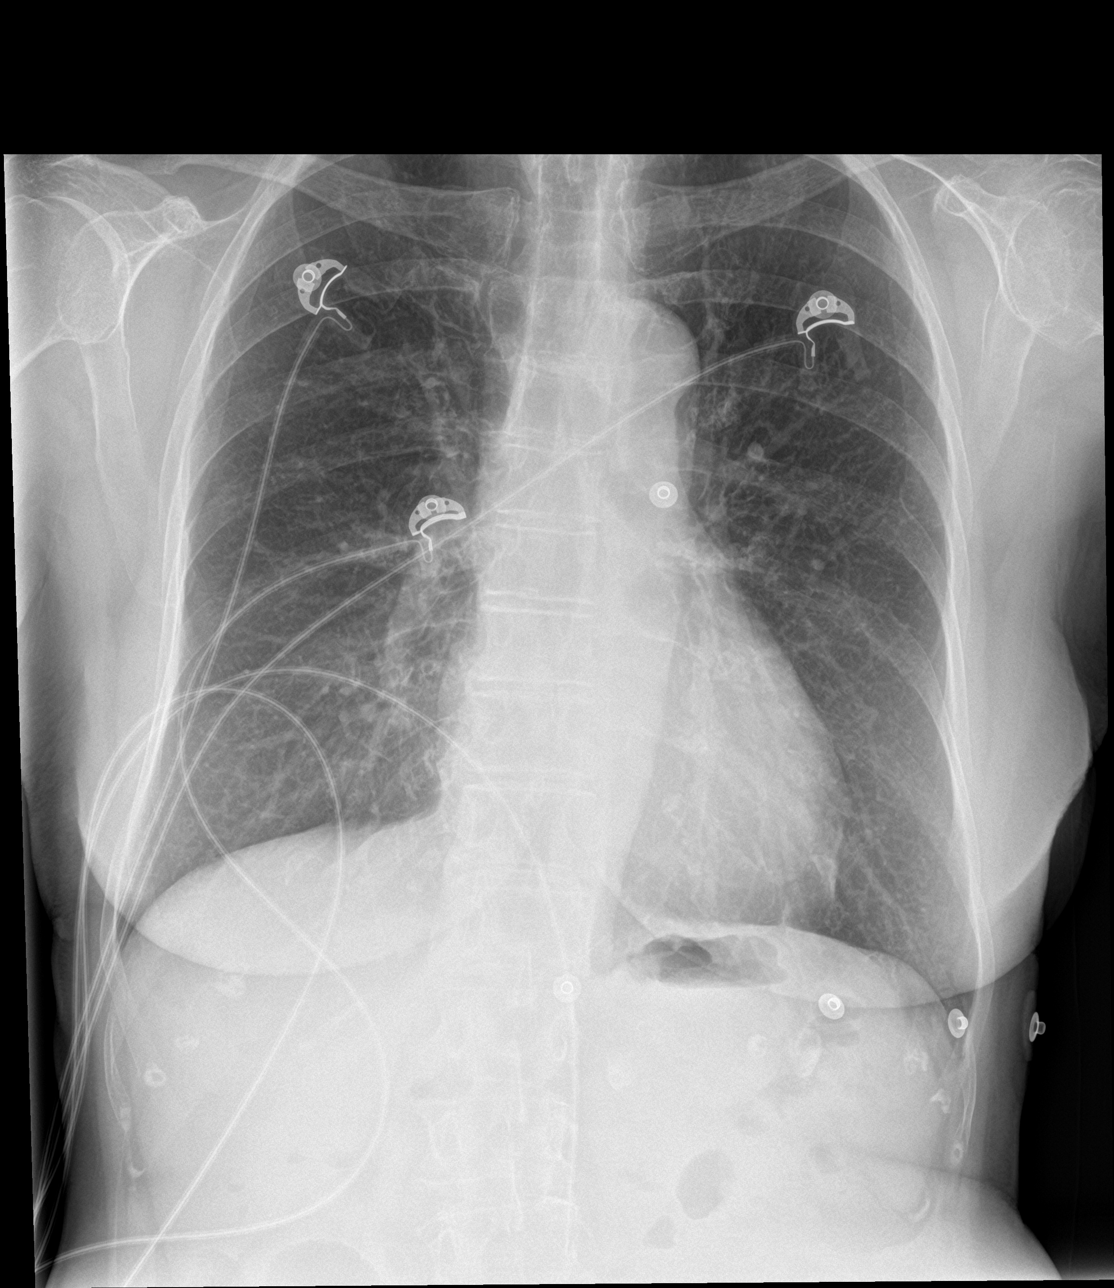

[abdomen supine]
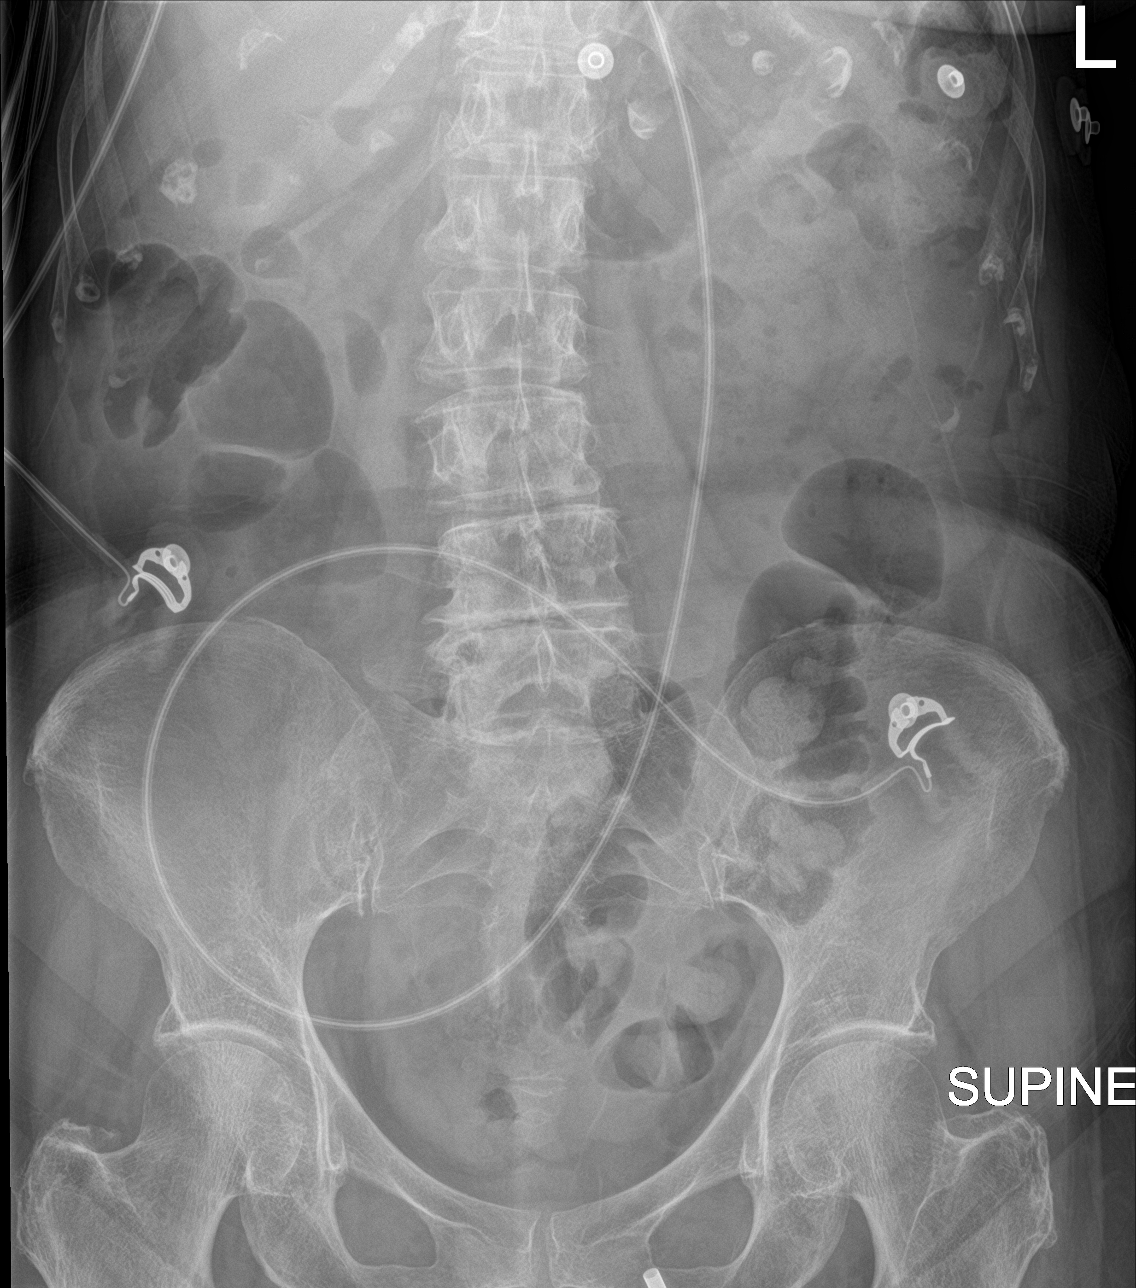

[abdomen erect]
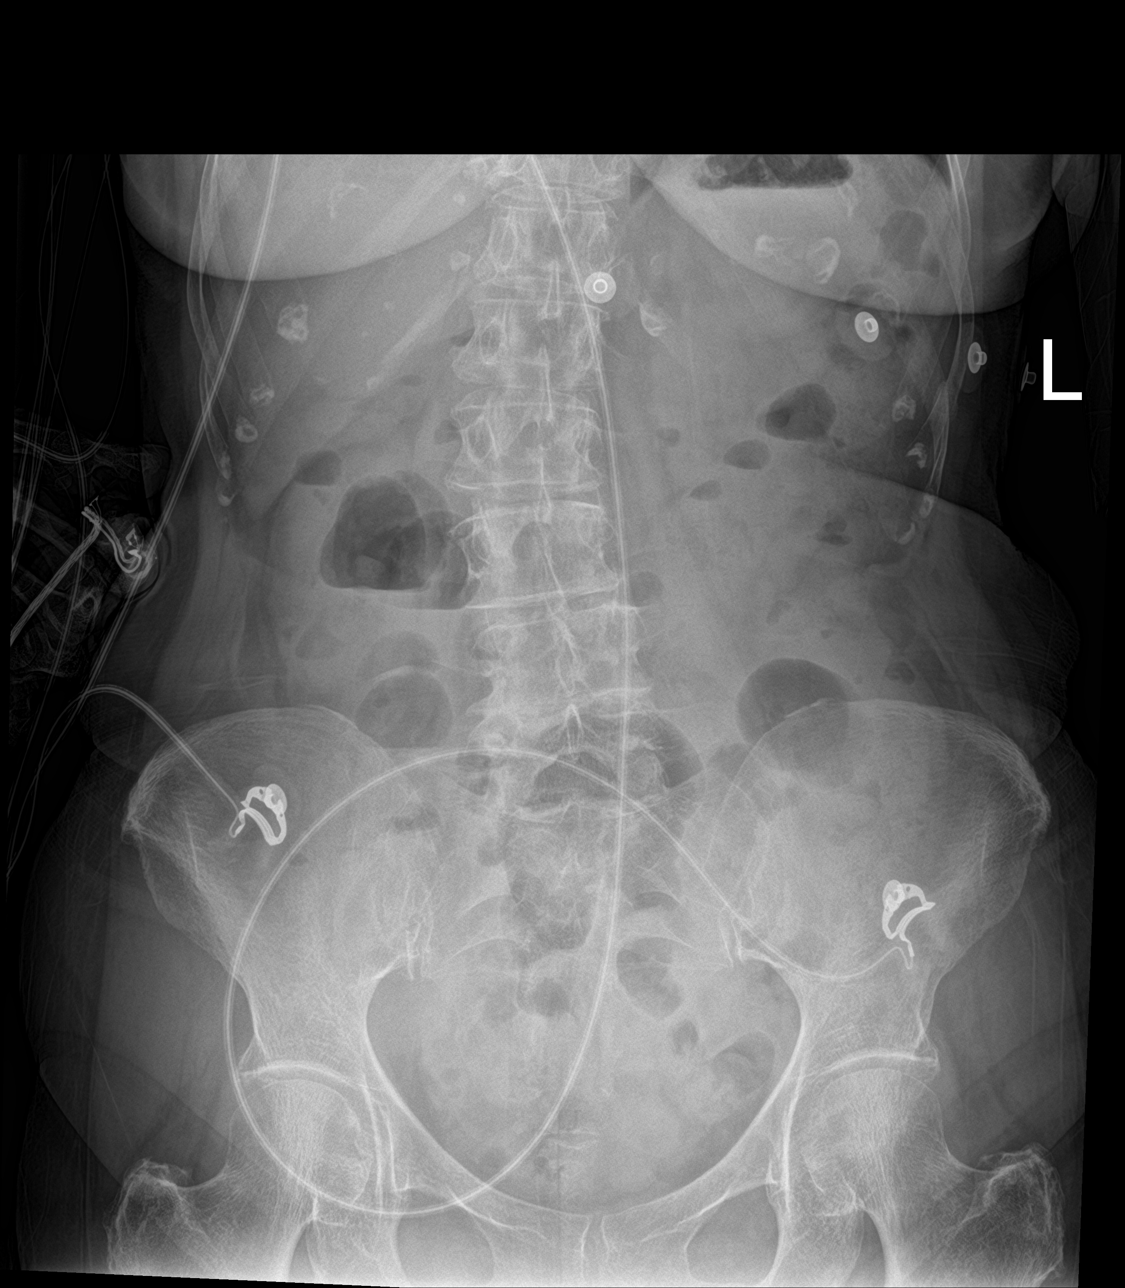

[3 of 3 positions shown; findings below may reference images not displayed]

FINDINGS: A few air-fluid levels are identified in nondilated loops of colon
on upright imaging. No evidence of bowel obstruction. The abdomen,
pelvis, and chest are otherwise normal.
IMPRESSION: A few air-fluid levels are identified in nondilated loops of colon
on upright imaging. This is nonspecific but could be seen in the
setting of diarrhea. No evidence of bowel obstruction. No other
abnormalities.

## 2022-06-20 DIAGNOSIS — R208 Other disturbances of skin sensation: Secondary | ICD-10-CM | POA: Diagnosis not present

## 2022-06-20 DIAGNOSIS — D2271 Melanocytic nevi of right lower limb, including hip: Secondary | ICD-10-CM | POA: Diagnosis not present

## 2022-06-20 DIAGNOSIS — L821 Other seborrheic keratosis: Secondary | ICD-10-CM | POA: Diagnosis not present

## 2022-06-20 DIAGNOSIS — L820 Inflamed seborrheic keratosis: Secondary | ICD-10-CM | POA: Diagnosis not present

## 2022-06-20 DIAGNOSIS — D225 Melanocytic nevi of trunk: Secondary | ICD-10-CM | POA: Diagnosis not present

## 2022-06-20 DIAGNOSIS — L538 Other specified erythematous conditions: Secondary | ICD-10-CM | POA: Diagnosis not present

## 2022-06-20 DIAGNOSIS — L814 Other melanin hyperpigmentation: Secondary | ICD-10-CM | POA: Diagnosis not present

## 2022-06-28 ENCOUNTER — Other Ambulatory Visit: Payer: Self-pay | Admitting: Internal Medicine

## 2022-06-28 DIAGNOSIS — Z1231 Encounter for screening mammogram for malignant neoplasm of breast: Secondary | ICD-10-CM

## 2022-07-06 ENCOUNTER — Ambulatory Visit
Admission: RE | Admit: 2022-07-06 | Discharge: 2022-07-06 | Disposition: A | Discharge status: Home / Self Care | Payer: Medicare Other | Source: Ambulatory Visit | Attending: Internal Medicine | Admitting: Internal Medicine

## 2022-07-06 DIAGNOSIS — Z1231 Encounter for screening mammogram for malignant neoplasm of breast: Secondary | ICD-10-CM

## 2022-07-10 ENCOUNTER — Other Ambulatory Visit: Payer: Self-pay | Admitting: Internal Medicine

## 2022-07-10 DIAGNOSIS — R928 Other abnormal and inconclusive findings on diagnostic imaging of breast: Secondary | ICD-10-CM

## 2022-07-13 ENCOUNTER — Other Ambulatory Visit: Payer: Self-pay | Admitting: Internal Medicine

## 2022-07-13 ENCOUNTER — Ambulatory Visit
Admission: RE | Admit: 2022-07-13 | Discharge: 2022-07-13 | Disposition: A | Discharge status: Home / Self Care | Payer: Medicare Other | Source: Ambulatory Visit | Attending: Internal Medicine | Admitting: Internal Medicine

## 2022-07-13 DIAGNOSIS — R928 Other abnormal and inconclusive findings on diagnostic imaging of breast: Secondary | ICD-10-CM

## 2022-07-13 DIAGNOSIS — N63 Unspecified lump in unspecified breast: Secondary | ICD-10-CM | POA: Diagnosis not present

## 2022-07-13 DIAGNOSIS — N6310 Unspecified lump in the right breast, unspecified quadrant: Secondary | ICD-10-CM

## 2022-07-13 DIAGNOSIS — R922 Inconclusive mammogram: Secondary | ICD-10-CM | POA: Diagnosis not present

## 2022-07-14 ENCOUNTER — Ambulatory Visit
Admission: RE | Admit: 2022-07-14 | Discharge: 2022-07-14 | Disposition: A | Discharge status: Home / Self Care | Payer: Medicare Other | Source: Ambulatory Visit | Attending: Internal Medicine | Admitting: Internal Medicine

## 2022-07-14 DIAGNOSIS — D0511 Intraductal carcinoma in situ of right breast: Secondary | ICD-10-CM | POA: Diagnosis not present

## 2022-07-14 DIAGNOSIS — Z170 Estrogen receptor positive status [ER+]: Secondary | ICD-10-CM | POA: Diagnosis not present

## 2022-07-14 DIAGNOSIS — N63 Unspecified lump in unspecified breast: Secondary | ICD-10-CM | POA: Diagnosis not present

## 2022-07-14 DIAGNOSIS — N6310 Unspecified lump in the right breast, unspecified quadrant: Secondary | ICD-10-CM

## 2022-07-14 DIAGNOSIS — C50211 Malignant neoplasm of upper-inner quadrant of right female breast: Secondary | ICD-10-CM | POA: Diagnosis not present

## 2022-07-14 DIAGNOSIS — R928 Other abnormal and inconclusive findings on diagnostic imaging of breast: Secondary | ICD-10-CM

## 2022-07-14 HISTORY — PX: BREAST BIOPSY: SHX20

## 2022-07-24 ENCOUNTER — Other Ambulatory Visit: Payer: Medicare Other

## 2022-07-24 ENCOUNTER — Encounter: Payer: Self-pay | Admitting: *Deleted

## 2022-07-24 ENCOUNTER — Ambulatory Visit: Payer: Medicare Other | Admitting: Emergency Medicine

## 2022-07-24 NOTE — Progress Notes (Signed)
Reached out to Tor Netters to introduce myself as the office RN Navigator and explain our new patient process. Reviewed the reason for their referral and scheduled their new patient appointment along with labs. Provided address and directions to the office including call back phone number. Reviewed with patient any concerns they may have or any possible barriers to attending their appointment.   Informed patient about my role as a navigator and that I will meet with them prior to their New Patient appointment and more fully discuss what services I can provide. At this time patient has no further questions or needs.    Pathology thus far shows [facility] ER/PR+  Oncology Nurse Navigator Documentation     07/24/2022    3:00 PM  Oncology Nurse Navigator Flowsheets  Abnormal Finding Date 07/07/2022  Confirmed Diagnosis Date 07/14/2022  Navigator Follow Up Date: 07/27/2022  Navigator Follow Up Reason: New Patient Appointment  Navigator Location [facility]  Referral Date to RadOnc/MedOnc 07/21/2022  Navigator Encounter Type Introductory Phone Call  Patient Visit Type MedOnc  Treatment Phase Pre-Tx/Tx Discussion  Barriers/Navigation Needs Coordination of Care;Education  Education Other  Interventions Coordination of Care;Education  Acuity Level 2-Minimal Needs (1-2 Barriers Identified)  Coordination of Care Appts  Education Method Verbal  Time Spent with Patient 30

## 2022-07-27 ENCOUNTER — Encounter: Payer: Self-pay | Admitting: Hematology & Oncology

## 2022-07-27 ENCOUNTER — Inpatient Hospital Stay: Payer: Medicare Other | Admitting: Hematology & Oncology

## 2022-07-27 ENCOUNTER — Inpatient Hospital Stay: Payer: Medicare Other | Attending: Hematology & Oncology

## 2022-07-27 ENCOUNTER — Encounter: Payer: Self-pay | Admitting: *Deleted

## 2022-07-27 VITALS — BP 147/86 | HR 77 | Temp 97.9°F | Resp 18 | Ht 62.0 in | Wt 122.0 lb

## 2022-07-27 DIAGNOSIS — Z79899 Other long term (current) drug therapy: Secondary | ICD-10-CM | POA: Diagnosis not present

## 2022-07-27 DIAGNOSIS — Z170 Estrogen receptor positive status [ER+]: Secondary | ICD-10-CM

## 2022-07-27 DIAGNOSIS — C50011 Malignant neoplasm of nipple and areola, right female breast: Secondary | ICD-10-CM

## 2022-07-27 DIAGNOSIS — C50211 Malignant neoplasm of upper-inner quadrant of right female breast: Secondary | ICD-10-CM | POA: Insufficient documentation

## 2022-07-27 DIAGNOSIS — Z803 Family history of malignant neoplasm of breast: Secondary | ICD-10-CM | POA: Diagnosis not present

## 2022-07-27 LAB — CMP (CANCER CENTER ONLY)
ALT: 17 U/L (ref 0–44)
AST: 19 U/L (ref 15–41)
Albumin: 4.5 g/dL (ref 3.5–5.0)
Alkaline Phosphatase: 62 U/L (ref 38–126)
Anion gap: 6 (ref 5–15)
BUN: 18 mg/dL (ref 8–23)
CO2: 31 mmol/L (ref 22–32)
Calcium: 10.3 mg/dL (ref 8.9–10.3)
Chloride: 100 mmol/L (ref 98–111)
Creatinine: 1.01 mg/dL — ABNORMAL HIGH (ref 0.44–1.00)
GFR, Estimated: 54 mL/min — ABNORMAL LOW (ref >60)
Glucose, Bld: 99 mg/dL (ref 70–99)
Potassium: 4.2 mmol/L (ref 3.5–5.1)
Sodium: 137 mmol/L (ref 135–145)
Total Bilirubin: 0.6 mg/dL (ref 0.3–1.2)
Total Protein: 7.2 g/dL (ref 6.5–8.1)

## 2022-07-27 LAB — CBC WITH DIFFERENTIAL (CANCER CENTER ONLY)
Abs Immature Granulocytes: 0.01 10*3/uL (ref 0.00–0.07)
Basophils Absolute: 0 10*3/uL (ref 0.0–0.1)
Basophils Relative: 0 %
Eosinophils Absolute: 0.1 10*3/uL (ref 0.0–0.5)
Eosinophils Relative: 2 %
HCT: 41.3 % (ref 36.0–46.0)
Hemoglobin: 13.4 g/dL (ref 12.0–15.0)
Immature Granulocytes: 0 %
Lymphocytes Relative: 16 %
Lymphs Abs: 0.7 10*3/uL (ref 0.7–4.0)
MCH: 29.6 pg (ref 26.0–34.0)
MCHC: 32.4 g/dL (ref 30.0–36.0)
MCV: 91.2 fL (ref 80.0–100.0)
Monocytes Absolute: 0.4 10*3/uL (ref 0.1–1.0)
Monocytes Relative: 9 %
Neutro Abs: 3.2 10*3/uL (ref 1.7–7.7)
Neutrophils Relative %: 73 %
Platelet Count: 196 10*3/uL (ref 150–400)
RBC: 4.53 MIL/uL (ref 3.87–5.11)
RDW: 12.6 % (ref 11.5–15.5)
WBC Count: 4.4 10*3/uL (ref 4.0–10.5)
nRBC: 0 % (ref 0.0–0.2)

## 2022-07-27 LAB — LACTATE DEHYDROGENASE: LDH: 167 U/L (ref 98–192)

## 2022-07-27 NOTE — Progress Notes (Signed)
Patient seen today for new diagnosis of Stage I IDC of Right Breast. ER/PR+ HER2-  She has an appointment with Dr Luisa Hart next week to discuss surgery. At this time Dr Myna Hidalgo does not believe she will need any neoadjuvant treatment.   Oncology Nurse Navigator Documentation     07/27/2022   11:00 AM  Oncology Nurse Navigator Flowsheets  Navigator Follow Up Date: 08/04/2022  Navigator Follow Up Reason: Review Note  Navigator Location [facility]  Navigator Encounter Type Initial MedOnc  Patient Visit Type MedOnc  Treatment Phase Pre-Tx/Tx Discussion  Barriers/Navigation Needs Coordination of Care;Education  Interventions None Required  Acuity Level 2-Minimal Needs (1-2 Barriers Identified)  Support Groups/Services Friends and Family  Time Spent with Patient 15

## 2022-07-27 NOTE — Progress Notes (Signed)
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  HPI: Lindsay Maldonado is a very charming 88 year old white female.  She is certain looks a lot younger than 88 years old.  She has been in great health.  She does have a history of a goiter that required a thyroidectomy.  She is on Synthroid.  She has been having mammograms despite being 88 years old.  She is subsequently had a mammogram back in the springtime.  Surprisingly, the mammogram did show an area in the right breast that was somewhat suspicious.  This was done on 07/13/2022.  The mammogram showed a 6 x 6 x 5 mm mass at the 1 o'clock position in the right breast.  She subsequently underwent a biopsy.  This was done on 07/14/2022.  The pathology report ([id number]) showed an invasive ductal carcinoma.  It had mild/moderate grade.  There is no lymphovascular invasion.  The length was 4 mm.  Thankfully, the prognostic markers showed the tumor to be estrogen positive, progesterone positive, and HER2 equivocal (2+)  Thankfully, the proliferation marker was only 5%.  She feels well.  She has had no complaints.  She never noted any problem with the right breast.  She had her menarche at age 3-13.  She had menopause age 15.  Her first child was born when she was 24 years old.  She was on estrogens for 5 years after she went through the change of life.  She has had no change in bowel or bladder habits.  She has had no cough or shortness of breath.  Overall, I would say that her performance status is ECOG 0.      Past Medical History:  Diagnosis Date   Cyst of breast    Fibromyalgia    Goiter    multinodular   Hypothyroidism    Osteopenia   :   Past Surgical History:  Procedure Laterality Date   BREAST BIOPSY Right 07/14/2022   [place] RT BREAST BX W LOC DEV 1ST  LESION IMG BX SPEC US GUIDE 07/14/2022 [facility] MAMMOGRAPHY   BREAST EXCISIONAL BIOPSY Left    benign   BREAST EXCISIONAL BIOPSY Right    benign   ENDOMETRIAL BIOPSY  2008   Dr. Clint Bolder CUFF REPAIR     Left   THYROID SURGERY  2007   Nodule removed  :   Current Outpatient Medications:    Cholecalciferol (VITAMIN D3) 50 MCG (2000 UT) capsule, Take 1 capsule (2,000 Units total) by mouth daily., Disp: 100 capsule, Rfl: 3   LORazepam (ATIVAN) 1 MG tablet, TAKE ONE TABLET BY MOUTH TWICE A DAY AS NEEDED FOR ANXIETY OR SLEEP, Disp: 60 tablet, Rfl: 3   SYNTHROID 75 MCG tablet, Take 1 tablet (75 mcg total) by mouth daily. Annual appt due in Feb must see provider for future refills, Disp: 90 tablet, Rfl: 3   triamterene-hydrochlorothiazide (MAXZIDE-25) 37.5-25 MG tablet, TAKE HALF TABLET BY MOUTH DAILY, Disp: 45 tablet, Rfl: 3   Vitamin D, Ergocalciferol, (DRISDOL) 1.25 MG (50000 UNIT) CAPS capsule, Take 1 capsule (50,000 Units total) by mouth every 7 (seven) days., Disp: 6 capsule, Rfl: 0:  :   Allergies  Allergen Reactions   Morphine     nausea  :   Family History  Problem Relation Age of Onset  Breast cancer Mother 93   Cancer Mother 54       breast   Parkinsonism Father    Breast cancer Maternal Aunt   :   Social History   Socioeconomic History   Marital status: Widowed    Spouse name: Not on file   Number of children: 5   Years of education: Not on file   Highest education level: Not on file  Occupational History   Occupation: housewife  Tobacco Use   Smoking status: Never   Smokeless tobacco: Never  Vaping Use   Vaping Use: Never used  Substance and Sexual Activity   Alcohol use: No   Drug use: Never   Sexual activity: Not on file  Other Topics Concern   Not on file  Social History Narrative   Physician Roster:   Dr. Arlyce Dice   Dr. Ewing Schlein   Social Determinants of Health   Financial Resource Strain: Low Risk  (04/10/2022)   Overall Financial Resource  Strain (CARDIA)    Difficulty of Paying Living Expenses: Not hard at all  Food Insecurity: No Food Insecurity (07/27/2022)   Hunger Vital Sign    Worried About Running Out of Food in the Last Year: Never true    Ran Out of Food in the Last Year: Never true  Transportation Needs: No Transportation Needs (07/27/2022)   PRAPARE - Administrator, Civil Service (Medical): No    Lack of Transportation (Non-Medical): No  Physical Activity: Sufficiently Active (04/10/2022)   Exercise Vital Sign    Days of Exercise per Week: 3 days    Minutes of Exercise per Session: 150+ min  Stress: No Stress Concern Present (04/10/2022)   [employer] of Occupational Health - Occupational Stress Questionnaire    Feeling of Stress : Not at all  Social Connections: Moderately Integrated (04/10/2022)   Social Connection and Isolation Panel [NHANES]    Frequency of Communication with Friends and Family: More than three times a week    Frequency of Social Gatherings with Friends and Family: More than three times a week    Attends Religious Services: More than 4 times per year    Active Member of [employer] or Organizations: Yes    Attends Banker Meetings: More than 4 times per year    Marital Status: Widowed  Intimate Partner Violence: Not At Risk (04/10/2022)   Humiliation, Afraid, Rape, and Kick questionnaire    Fear of Current or Ex-Partner: No    Emotionally Abused: No    Physically Abused: No    Sexually Abused: No  :  Review of Systems  Constitutional: Negative.   HENT: Negative.    Eyes: Negative.   Respiratory: Negative.    Cardiovascular: Negative.   Gastrointestinal: Negative.   Genitourinary: Negative.   Musculoskeletal: Negative.   Skin: Negative.   Neurological: Negative.   Endo/Heme/Allergies: Negative.   Psychiatric/Behavioral: Negative.       Exam: Vital signs show temperature of 97.9.  Pulse 77.  Blood pressure 147/86.  Weight is 122  pounds.  @IPVITALS @ Physical Exam Vitals reviewed.  Constitutional:      Comments: Her breast exam shows left breast with no masses, edema or erythema.  There is no left axillary adenopathy.  Right breast shows a biopsy scar at about the 2 o'clock position.  There is no erythema or swelling.  There is no ecchymoses.  There is no nipple discharge.  There is no right axillary adenopathy.  HENT:  Head: Normocephalic and atraumatic.  Eyes:     Pupils: Pupils are equal, round, and reactive to light.  Cardiovascular:     Rate and Rhythm: Normal rate and regular rhythm.     Heart sounds: Normal heart sounds.  Pulmonary:     Effort: Pulmonary effort is normal.     Breath sounds: Normal breath sounds.  Abdominal:     General: Bowel sounds are normal.     Palpations: Abdomen is soft.  Musculoskeletal:        General: No tenderness or deformity. Normal range of motion.     Cervical back: Normal range of motion.  Lymphadenopathy:     Cervical: No cervical adenopathy.  Skin:    General: Skin is warm and dry.     Findings: No erythema or rash.  Neurological:     Mental Status: She is alert and oriented to person, place, and time.  Psychiatric:        Behavior: Behavior normal.        Thought Content: Thought content normal.        Judgment: Judgment normal.     Recent Labs    07/27/22 1109  WBC 4.4  HGB 13.4  HCT 41.3  PLT 196    Recent Labs    07/27/22 1109  NA 137  K 4.2  CL 100  CO2 31  GLUCOSE 99  BUN 18  CREATININE 1.01*  CALCIUM 10.3    Blood smear review: None  Pathology: See above    Assessment and Plan: Lindsay Maldonado is a very nice 88 year old postmenopausal white female.  She has what I would have believed is a stage IA (T1aNxM0) infiltrating ductal carcinoma of the right breast.  She has excellent prognostic markers.  I think the best marker is the fact that the Ki-67 is only 5%.  She clearly would be a candidate for a lumpectomy.  I would not think  that, given her age, and the likely early stage breast cancer that she would need radiation therapy.  I do not see that we have to do any additional studies on her.  Her labs look okay.  I am not sure that an MRI would really add anything to what surgery needs to have done.  She will see the surgeon next week.  I would think that surgery probably will be in about 3 to 4 weeks.  Again, I would be surprised if she would need any radiation therapy in the adjuvant setting.  I suspect we will just put her on an aromatase inhibitor.  It was a true pleasure talking with her.  Again she looks so young and vibrant.  We can be aggressive.

## 2022-07-28 LAB — CANCER ANTIGEN 27.29: CA 27.29: 17.7 U/mL (ref 0.0–38.6)

## 2022-08-04 ENCOUNTER — Ambulatory Visit: Payer: Self-pay | Admitting: Surgery

## 2022-08-04 ENCOUNTER — Encounter: Payer: Self-pay | Admitting: *Deleted

## 2022-08-04 DIAGNOSIS — C50911 Malignant neoplasm of unspecified site of right female breast: Secondary | ICD-10-CM

## 2022-08-04 NOTE — Progress Notes (Unsigned)
Patient was seen by Dr Luisa Hart today. Plan for lumpectomy.  Will follow for surgical date.   Oncology Nurse Navigator Documentation     08/04/2022    2:15 PM  Oncology Nurse Navigator Flowsheets  Navigator Follow Up Date: 09/14/2022  Navigator Follow Up Reason: Surgery  Navigator Location [facility]  Navigator Encounter Type Appt/Treatment Plan Review  Patient Visit Type MedOnc  Treatment Phase Pre-Tx/Tx Discussion  Barriers/Navigation Needs Coordination of Care;Education  Interventions None Required  Acuity Level 2-Minimal Needs (1-2 Barriers Identified)  Support Groups/Services Friends and Family  Time Spent with Patient 15

## 2022-08-07 ENCOUNTER — Telehealth: Payer: Self-pay | Admitting: Radiation Oncology

## 2022-08-07 ENCOUNTER — Other Ambulatory Visit: Payer: Self-pay | Admitting: Surgery

## 2022-08-07 DIAGNOSIS — C50911 Malignant neoplasm of unspecified site of right female breast: Secondary | ICD-10-CM

## 2022-08-07 NOTE — Telephone Encounter (Signed)
Spoke to pt 6/17 who decided she is not interested in referral at this time. Pt was advised to let her oncologist or surgeon know if she changes her mind. Referral closed at this time.

## 2022-09-06 ENCOUNTER — Encounter (HOSPITAL_COMMUNITY): Payer: Self-pay

## 2022-09-06 NOTE — Pre-Procedure Instructions (Signed)
Surgical Instructions   Your procedure is scheduled on Thursday, July 25th. Report to [facility] Main Entrance "A" at 05:30 A.M., then check in with the Admitting office. Any questions or running late day of surgery: call [phone]  Questions prior to your surgery date: call [phone], Monday-Friday, 8am-4pm. If you experience any cold or flu symptoms such as cough, fever, chills, shortness of breath, etc. between now and your scheduled surgery, please notify us at the above number.     Remember:  Do not eat after midnight the night before your surgery   You may drink clear liquids until 04:30 AM the morning of your surgery.   Clear liquids allowed are: Water, Non-Citrus Juices (without pulp), Carbonated Beverages, Clear Tea, Black Coffee Only (NO MILK, CREAM OR POWDERED CREAMER of any kind), and Gatorade.    Take these medicines the morning of surgery with A SIP OF WATER  SYNTHROID    May take these medicines IF NEEDED: LORazepam (ATIVAN)     One week prior to surgery, STOP taking any Aspirin (unless otherwise instructed by your surgeon) Aleve, Naproxen, Ibuprofen, Motrin, Advil, Goody's, BC's, all herbal medications, fish oil, and non-prescription vitamins.                     Do NOT Smoke (Tobacco/Vaping) for 24 hours prior to your procedure.  If you use a CPAP at night, you may bring your mask/headgear for your overnight stay.   You will be asked to remove any contacts, glasses, piercing's, hearing aid's, dentures/partials prior to surgery. Please bring cases for these items if needed.    Patients discharged the day of surgery will not be allowed to drive home, and someone needs to stay with them for 24 hours.  SURGICAL WAITING ROOM VISITATION Patients may have no more than 2 support people in the waiting area - these visitors may rotate.   Pre-op nurse will coordinate an appropriate time for 1 ADULT support person, who may not rotate, to accompany patient in  pre-op.  Children under the age of 56 must have an adult with them who is not the patient and must remain in the main waiting area with an adult.  If the patient needs to stay at the hospital during part of their recovery, the visitor guidelines for inpatient rooms apply.  Please refer to the [facility] website for the visitor guidelines for any additional information.   If you received a COVID test during your pre-op visit  it is requested that you wear a mask when out in public, stay away from anyone that may not be feeling well and notify your surgeon if you develop symptoms. If you have been in contact with anyone that has tested positive in the last 10 days please notify you surgeon.      Pre-operative CHG Bathing Instructions   You can play a key role in reducing the risk of infection after surgery. Your skin needs to be as free of germs as possible. You can reduce the number of germs on your skin by washing with CHG (chlorhexidine gluconate) soap before surgery. CHG is an antiseptic soap that kills germs and continues to kill germs even after washing.   DO NOT use if you have an allergy to chlorhexidine/CHG or antibacterial soaps. If your skin becomes reddened or irritated, stop using the CHG and notify one of our RNs at [phone].  TAKE A SHOWER THE NIGHT BEFORE SURGERY AND THE DAY OF SURGERY    Please keep in mind the following:  DO NOT shave, including legs and underarms, 48 hours prior to surgery.   You may shave your face before/day of surgery.  Place clean sheets on your bed the night before surgery Use a clean washcloth (not used since being washed) for each shower. DO NOT sleep with pet's night before surgery.  CHG Shower Instructions:  If you choose to wash your hair and private area, wash first with your normal shampoo/soap.  After you use shampoo/soap, rinse your hair and body thoroughly to remove shampoo/soap residue.  Turn the water OFF and apply  half the bottle of CHG soap to a CLEAN washcloth.  Apply CHG soap ONLY FROM YOUR NECK DOWN TO YOUR TOES (washing for 3-5 minutes)  DO NOT use CHG soap on face, private areas, open wounds, or sores.  Pay special attention to the area where your surgery is being performed.  If you are having back surgery, having someone wash your back for you may be helpful. Wait 2 minutes after CHG soap is applied, then you may rinse off the CHG soap.  Pat dry with a clean towel  Put on clean pajamas    Additional instructions for the day of surgery: DO NOT APPLY any lotions, deodorants, cologne, or perfumes.   Do not wear jewelry or makeup Do not wear nail polish, gel polish, artificial nails, or any other type of covering on natural nails (fingers and toes) Do not bring valuables to the hospital. [facility] is not responsible for valuables/personal belongings. Put on clean/comfortable clothes.  Please brush your teeth.  Ask your nurse before applying any prescription medications to the skin.

## 2022-09-07 ENCOUNTER — Other Ambulatory Visit: Payer: Self-pay

## 2022-09-07 ENCOUNTER — Encounter (HOSPITAL_COMMUNITY)
Admission: RE | Admit: 2022-09-07 | Discharge: 2022-09-07 | Disposition: A | Discharge status: Home / Self Care | Payer: Medicare Other | Source: Ambulatory Visit | Attending: Surgery | Admitting: Surgery

## 2022-09-07 ENCOUNTER — Encounter (HOSPITAL_COMMUNITY): Payer: Self-pay

## 2022-09-07 VITALS — BP 159/83 | HR 72 | Temp 98.1°F | Resp 16 | Ht 62.0 in | Wt 123.4 lb

## 2022-09-07 DIAGNOSIS — I10 Essential (primary) hypertension: Secondary | ICD-10-CM | POA: Diagnosis not present

## 2022-09-07 DIAGNOSIS — Z01818 Encounter for other preprocedural examination: Secondary | ICD-10-CM | POA: Diagnosis not present

## 2022-09-07 HISTORY — DX: Malignant neoplasm of unspecified site of right female breast: C50.911

## 2022-09-07 HISTORY — DX: Essential (primary) hypertension: I10

## 2022-09-07 LAB — CBC
HCT: 43.1 % (ref 36.0–46.0)
Hemoglobin: 13.9 g/dL (ref 12.0–15.0)
MCH: 30.3 pg (ref 26.0–34.0)
MCHC: 32.3 g/dL (ref 30.0–36.0)
MCV: 93.9 fL (ref 80.0–100.0)
Platelets: 208 10*3/uL (ref 150–400)
RBC: 4.59 MIL/uL (ref 3.87–5.11)
RDW: 12.9 % (ref 11.5–15.5)
WBC: 5.1 10*3/uL (ref 4.0–10.5)
nRBC: 0 % (ref 0.0–0.2)

## 2022-09-07 LAB — BASIC METABOLIC PANEL
Anion gap: 6 (ref 5–15)
BUN: 13 mg/dL (ref 8–23)
CO2: 29 mmol/L (ref 22–32)
Calcium: 9.4 mg/dL (ref 8.9–10.3)
Chloride: 99 mmol/L (ref 98–111)
Creatinine, Ser: 0.79 mg/dL (ref 0.44–1.00)
GFR, Estimated: >60 mL/min (ref >60)
Glucose, Bld: 95 mg/dL (ref 70–99)
Potassium: 3.9 mmol/L (ref 3.5–5.1)
Sodium: 134 mmol/L — ABNORMAL LOW (ref 135–145)

## 2022-09-07 NOTE — Progress Notes (Signed)
PCP - Dr. Jacinta Shoe Cardiologist - denies  PPM/ICD - denies   Chest x-ray - 06/09/19 EKG - 09/07/22 Stress Test - denies ECHO - denies Cardiac Cath - denies  Sleep Study - denies   DM- denies  ASA/Blood Thinner Instructions: n/a   ERAS Protcol - yes, no drink   COVID TEST- n/a   Anesthesia review: yes, breast seed placement  Patient denies shortness of breath, fever, cough and chest pain at PAT appointment   All instructions explained to the patient, with a verbal understanding of the material. Patient agrees to go over the instructions while at home for a better understanding.  The opportunity to ask questions was provided.

## 2022-09-13 ENCOUNTER — Ambulatory Visit
Admission: RE | Admit: 2022-09-13 | Discharge: 2022-09-13 | Disposition: A | Discharge status: Home / Self Care | Payer: Medicare Other | Source: Ambulatory Visit | Attending: Surgery | Admitting: Surgery

## 2022-09-13 DIAGNOSIS — C50911 Malignant neoplasm of unspecified site of right female breast: Secondary | ICD-10-CM

## 2022-09-13 DIAGNOSIS — C50211 Malignant neoplasm of upper-inner quadrant of right female breast: Secondary | ICD-10-CM | POA: Diagnosis not present

## 2022-09-13 HISTORY — PX: BREAST BIOPSY: SHX20

## 2022-09-14 ENCOUNTER — Ambulatory Visit (HOSPITAL_BASED_OUTPATIENT_CLINIC_OR_DEPARTMENT_OTHER): Payer: Medicare Other | Admitting: Anesthesiology

## 2022-09-14 ENCOUNTER — Encounter (HOSPITAL_COMMUNITY): Payer: Self-pay | Admitting: Surgery

## 2022-09-14 ENCOUNTER — Encounter (HOSPITAL_COMMUNITY)
Admission: RE | Disposition: A | Discharge status: Home / Self Care | Payer: Self-pay | Source: Ambulatory Visit | Attending: Surgery

## 2022-09-14 ENCOUNTER — Encounter: Payer: Self-pay | Admitting: *Deleted

## 2022-09-14 ENCOUNTER — Other Ambulatory Visit: Payer: Self-pay

## 2022-09-14 ENCOUNTER — Ambulatory Visit (HOSPITAL_COMMUNITY): Payer: Medicare Other | Admitting: Physician Assistant

## 2022-09-14 ENCOUNTER — Ambulatory Visit
Admission: RE | Admit: 2022-09-14 | Discharge: 2022-09-14 | Disposition: A | Discharge status: Home / Self Care | Payer: Medicare Other | Source: Ambulatory Visit | Attending: Surgery | Admitting: Surgery

## 2022-09-14 ENCOUNTER — Ambulatory Visit (HOSPITAL_COMMUNITY)
Admission: RE | Admit: 2022-09-14 | Discharge: 2022-09-14 | Disposition: A | Discharge status: Home / Self Care | Payer: Medicare Other | Source: Ambulatory Visit | Attending: Surgery | Admitting: Surgery

## 2022-09-14 DIAGNOSIS — Z170 Estrogen receptor positive status [ER+]: Secondary | ICD-10-CM | POA: Diagnosis not present

## 2022-09-14 DIAGNOSIS — C50911 Malignant neoplasm of unspecified site of right female breast: Secondary | ICD-10-CM | POA: Diagnosis not present

## 2022-09-14 DIAGNOSIS — C50811 Malignant neoplasm of overlapping sites of right female breast: Secondary | ICD-10-CM | POA: Diagnosis not present

## 2022-09-14 DIAGNOSIS — C50211 Malignant neoplasm of upper-inner quadrant of right female breast: Secondary | ICD-10-CM | POA: Diagnosis not present

## 2022-09-14 HISTORY — PX: BREAST LUMPECTOMY WITH RADIOACTIVE SEED LOCALIZATION: SHX6424

## 2022-09-14 SURGERY — BREAST LUMPECTOMY WITH RADIOACTIVE SEED LOCALIZATION
Anesthesia: General | Site: Breast | Laterality: Right

## 2022-09-14 MED ORDER — OXYCODONE HCL 5 MG PO TABS: 5.0000 mg | ORAL_TABLET | Freq: Four times a day (QID) | ORAL | 0 refills | Status: DC | PRN

## 2022-09-14 MED ORDER — DEXAMETHASONE SODIUM PHOSPHATE 10 MG/ML IJ SOLN
INTRAMUSCULAR | Status: AC
  Filled 2022-09-14: qty 1

## 2022-09-14 MED ORDER — CHLORHEXIDINE GLUCONATE CLOTH 2 % EX PADS: 6.0000 | MEDICATED_PAD | Freq: Once | CUTANEOUS | Status: DC

## 2022-09-14 MED ORDER — OXYCODONE HCL 5 MG PO TABS: 5.0000 mg | ORAL_TABLET | Freq: Once | ORAL | Status: AC | PRN

## 2022-09-14 MED ORDER — OXYCODONE HCL 5 MG/5ML PO SOLN
ORAL | Status: AC
  Filled 2022-09-14: qty 5

## 2022-09-14 MED ORDER — ONDANSETRON HCL 4 MG/2ML IJ SOLN: 4.0000 mg | Freq: Four times a day (QID) | INTRAMUSCULAR | Status: DC | PRN

## 2022-09-14 MED ORDER — PROPOFOL 10 MG/ML IV BOLUS
INTRAVENOUS | Status: DC | PRN
  Administered 2022-09-14: 50 mg via INTRAVENOUS
  Administered 2022-09-14: 150 mg via INTRAVENOUS

## 2022-09-14 MED ORDER — BUPIVACAINE-EPINEPHRINE 0.25% -1:200000 IJ SOLN
INTRAMUSCULAR | Status: DC | PRN
  Administered 2022-09-14: 20 mL

## 2022-09-14 MED ORDER — DEXAMETHASONE SODIUM PHOSPHATE 10 MG/ML IJ SOLN
INTRAMUSCULAR | Status: DC | PRN
  Administered 2022-09-14: 5 mg via INTRAVENOUS

## 2022-09-14 MED ORDER — PHENYLEPHRINE 80 MCG/ML (10ML) SYRINGE FOR IV PUSH (FOR BLOOD PRESSURE SUPPORT)
PREFILLED_SYRINGE | INTRAVENOUS | Status: AC
  Filled 2022-09-14: qty 10

## 2022-09-14 MED ORDER — PHENYLEPHRINE HCL-NACL 20-0.9 MG/250ML-% IV SOLN
INTRAVENOUS | Status: DC | PRN
  Administered 2022-09-14: 25 ug/min via INTRAVENOUS

## 2022-09-14 MED ORDER — CHLORHEXIDINE GLUCONATE 0.12 % MT SOLN: 15.0000 mL | Freq: Once | OROMUCOSAL | Status: AC

## 2022-09-14 MED ORDER — FENTANYL CITRATE (PF) 250 MCG/5ML IJ SOLN
INTRAMUSCULAR | Status: AC
  Filled 2022-09-14: qty 5

## 2022-09-14 MED ORDER — PROPOFOL 10 MG/ML IV BOLUS
INTRAVENOUS | Status: AC
  Filled 2022-09-14: qty 20

## 2022-09-14 MED ORDER — FENTANYL CITRATE (PF) 250 MCG/5ML IJ SOLN
INTRAMUSCULAR | Status: DC | PRN
  Administered 2022-09-14: 50 ug via INTRAVENOUS
  Administered 2022-09-14: 25 ug via INTRAVENOUS

## 2022-09-14 MED ORDER — FENTANYL CITRATE (PF) 100 MCG/2ML IJ SOLN: 25.0000 ug | INTRAMUSCULAR | Status: DC | PRN

## 2022-09-14 MED ORDER — LACTATED RINGERS IV SOLN: INTRAVENOUS | Status: DC

## 2022-09-14 MED ORDER — OXYCODONE HCL 5 MG/5ML PO SOLN
5.0000 mg | Freq: Once | ORAL | Status: AC | PRN
  Administered 2022-09-14: 5 mg via ORAL

## 2022-09-14 MED ORDER — CEFAZOLIN SODIUM-DEXTROSE 2-4 GM/100ML-% IV SOLN
INTRAVENOUS | Status: AC
  Filled 2022-09-14: qty 100

## 2022-09-14 MED ORDER — ONDANSETRON HCL 4 MG/2ML IJ SOLN
INTRAMUSCULAR | Status: DC | PRN
  Administered 2022-09-14: 4 mg via INTRAVENOUS

## 2022-09-14 MED ORDER — 0.9 % SODIUM CHLORIDE (POUR BTL) OPTIME
TOPICAL | Status: DC | PRN
  Administered 2022-09-14: 1000 mL

## 2022-09-14 MED ORDER — ONDANSETRON HCL 4 MG/2ML IJ SOLN
INTRAMUSCULAR | Status: AC
  Filled 2022-09-14: qty 2

## 2022-09-14 MED ORDER — CHLORHEXIDINE GLUCONATE 0.12 % MT SOLN
OROMUCOSAL | Status: AC
  Administered 2022-09-14: 15 mL via OROMUCOSAL
  Filled 2022-09-14: qty 15

## 2022-09-14 MED ORDER — LIDOCAINE 2% (20 MG/ML) 5 ML SYRINGE
INTRAMUSCULAR | Status: DC | PRN
  Administered 2022-09-14: 20 mg via INTRAVENOUS

## 2022-09-14 MED ORDER — CEFAZOLIN SODIUM-DEXTROSE 2-4 GM/100ML-% IV SOLN
2.0000 g | INTRAVENOUS | Status: AC
  Administered 2022-09-14: 2 g via INTRAVENOUS
  Filled 2022-09-14: qty 100

## 2022-09-14 MED ORDER — ARTIFICIAL TEARS OPHTHALMIC OINT
TOPICAL_OINTMENT | OPHTHALMIC | Status: AC
  Filled 2022-09-14: qty 3.5

## 2022-09-14 MED ORDER — ORAL CARE MOUTH RINSE: 15.0000 mL | Freq: Once | OROMUCOSAL | Status: AC

## 2022-09-14 MED ORDER — BUPIVACAINE-EPINEPHRINE (PF) 0.25% -1:200000 IJ SOLN
INTRAMUSCULAR | Status: AC
  Filled 2022-09-14: qty 30

## 2022-09-14 MED ORDER — PHENYLEPHRINE 80 MCG/ML (10ML) SYRINGE FOR IV PUSH (FOR BLOOD PRESSURE SUPPORT)
PREFILLED_SYRINGE | INTRAVENOUS | Status: DC | PRN
  Administered 2022-09-14 (×2): 80 ug via INTRAVENOUS
  Administered 2022-09-14: 160 ug via INTRAVENOUS

## 2022-09-14 SURGICAL SUPPLY — 40 items
ADH SKN CLS APL DERMABOND .7 (GAUZE/BANDAGES/DRESSINGS) ×1
APL PRP STRL LF DISP 70% ISPRP (MISCELLANEOUS) ×1
APPLIER CLIP 9.375 MED OPEN (MISCELLANEOUS) ×1
APR CLP MED 9.3 20 MLT OPN (MISCELLANEOUS) ×1
BAG COUNTER SPONGE SURGICOUNT (BAG) ×2 IMPLANT
BAG SPNG CNTER NS LX DISP (BAG)
BINDER BREAST LRG (GAUZE/BANDAGES/DRESSINGS) IMPLANT
BINDER BREAST XLRG (GAUZE/BANDAGES/DRESSINGS) IMPLANT
CANISTER SUCT 3000ML PPV (MISCELLANEOUS) IMPLANT
CHLORAPREP W/TINT 26 (MISCELLANEOUS) ×2 IMPLANT
CLIP APPLIE 9.375 MED OPEN (MISCELLANEOUS) IMPLANT
COVER PROBE W GEL 5X96 (DRAPES) ×2 IMPLANT
COVER SURGICAL LIGHT HANDLE (MISCELLANEOUS) ×2 IMPLANT
DERMABOND ADVANCED .7 DNX12 (GAUZE/BANDAGES/DRESSINGS) ×2 IMPLANT
DEVICE DUBIN SPECIMEN MAMMOGRA (MISCELLANEOUS) ×2 IMPLANT
DRAPE CHEST BREAST 15X10 FENES (DRAPES) ×2 IMPLANT
ELECT CAUTERY BLADE 6.4 (BLADE) ×2 IMPLANT
ELECT REM PT RETURN 9FT ADLT (ELECTROSURGICAL) ×1
ELECTRODE REM PT RTRN 9FT ADLT (ELECTROSURGICAL) ×2 IMPLANT
GAUZE PAD ABD 8X10 STRL (GAUZE/BANDAGES/DRESSINGS) ×2 IMPLANT
GLOVE BIO SURGEON STRL SZ8 (GLOVE) ×2 IMPLANT
GLOVE BIOGEL PI IND STRL 8 (GLOVE) ×2 IMPLANT
GOWN STRL REUS W/ TWL LRG LVL3 (GOWN DISPOSABLE) ×2 IMPLANT
GOWN STRL REUS W/ TWL XL LVL3 (GOWN DISPOSABLE) ×2 IMPLANT
GOWN STRL REUS W/TWL LRG LVL3 (GOWN DISPOSABLE) ×1
GOWN STRL REUS W/TWL XL LVL3 (GOWN DISPOSABLE) ×1
KIT BASIN OR (CUSTOM PROCEDURE TRAY) ×2 IMPLANT
KIT MARKER MARGIN INK (KITS) ×2 IMPLANT
LIGHT WAVEGUIDE WIDE FLAT (MISCELLANEOUS) IMPLANT
NDL HYPO 25GX1X1/2 BEV (NEEDLE) ×2 IMPLANT
NEEDLE HYPO 25GX1X1/2 BEV (NEEDLE) ×1 IMPLANT
NS IRRIG 1000ML POUR BTL (IV SOLUTION) IMPLANT
PACK GENERAL/GYN (CUSTOM PROCEDURE TRAY) ×2 IMPLANT
SUT MNCRL AB 4-0 PS2 18 (SUTURE) ×2 IMPLANT
SUT SILK 2 0 SH (SUTURE) IMPLANT
SUT VIC AB 2-0 SH 27 (SUTURE)
SUT VIC AB 2-0 SH 27XBRD (SUTURE) IMPLANT
SUT VIC AB 3-0 SH 8-18 (SUTURE) ×2 IMPLANT
SYR CONTROL 10ML LL (SYRINGE) ×2 IMPLANT
TOWEL GREEN STERILE FF (TOWEL DISPOSABLE) IMPLANT

## 2022-09-14 NOTE — Discharge Instructions (Signed)

## 2022-09-14 NOTE — Interval H&P Note (Signed)
History and Physical Interval Note:  09/14/2022 7:17 AM  Lindsay Maldonado  has presented today for surgery, with the diagnosis of RIGHT BREAST CANCER.  The various methods of treatment have been discussed with the patient and family. After consideration of risks, benefits and other options for treatment, the patient has consented to  Procedure(s): RIGHT BREAST LUMPECTOMY WITH RADIOACTIVE SEED LOCALIZATION (Right) as a surgical intervention.  The patient's history has been reviewed, patient examined, no change in status, stable for surgery.  I have reviewed the patient's chart and labs.  Questions were answered to the patient's satisfaction.     [name] A Tamma Brigandi

## 2022-09-14 NOTE — Anesthesia Procedure Notes (Signed)
Procedure Name: LMA Insertion Date/Time: 09/14/2022 7:31 AM  Performed by: Quentin Ore, CRNAPre-anesthesia Checklist: Patient identified, Emergency Drugs available, Suction available and Patient being monitored Patient Re-evaluated:Patient Re-evaluated prior to induction Oxygen Delivery Method: Circle system utilized Preoxygenation: Pre-oxygenation with 100% oxygen Induction Type: IV induction LMA: LMA inserted LMA Size: 4.0 Number of attempts: 1 Placement Confirmation: positive ETCO2 and breath sounds checked- equal and bilateral Tube secured with: Tape Dental Injury: Teeth and Oropharynx as per pre-operative assessment

## 2022-09-14 NOTE — H&P (Signed)
History of Present Illness: Lindsay Maldonado is a 88 y.o. female who is seen today as an office consultation for evaluation of NEW BREAST CANCER (RIGHT breast IDC 1)  Pt present for evaluation of stage 1 right breast cancer. Pt found to have a 1 cm mass UOQ core bx IDC grade 1 ER/PR POS HER 2 NEU NEG 5 % KI No symptoms  Family hx BR CA in mother a 2 maternal aunts   Review of Systems: A complete review of systems was obtained from the patient. I have reviewed this information and discussed as appropriate with the patient. See HPI as well for other ROS.    Medical History: Past Medical History:  Diagnosis Date  Arthritis  Hypertension  Thyroid disease   There is no problem list on file for this patient.  Past Surgical History:  Procedure Laterality Date  THYROID SURGERY 2007  ENDOMETRIAL BIOPSY 2008  BREAST BIOPSY Right 07/14/2022  BREAST BIOPSY Left  SHOULDER SURGERY  ROTATOR CUFF REPAIR    Allergies  Allergen Reactions  Morphine Other (See Comments)  nausea   Current Outpatient Medications on File Prior to Visit  Medication Sig Dispense Refill  ergocalciferol, vitamin D2, 1,250 mcg (50,000 unit) capsule Take 50,000 Units by mouth every 7 (seven) days  levothyroxine (SYNTHROID) 75 MCG tablet Take by mouth  LORazepam (ATIVAN) 1 MG tablet TAKE ONE TABLET BY MOUTH TWICE A DAY AS NEEDED FOR ANXIETY OR SLEEP  triamterene-hydroCHLOROthiazide (MAXZIDE-25) 37.5-25 mg tablet TAKE HALF TABLET BY MOUTH DAILY   No current facility-administered medications on file prior to visit.   History reviewed. No pertinent family history.   Social History   Tobacco Use  Smoking Status Never  Smokeless Tobacco Never    Social History   Socioeconomic History  Marital status: Widowed  Tobacco Use  Smoking status: Never  Smokeless tobacco: Never  Substance and Sexual Activity  Alcohol use: Not Currently  Drug use: Never   Social Determinants of Health   Financial Resource  Strain: Low Risk (04/10/2022)  Received from [facility]  Overall Financial Resource Strain (CARDIA)  Difficulty of Paying Living Expenses: Not hard at all  Food Insecurity: No Food Insecurity (07/27/2022)  Received from [facility]  Hunger Vital Sign  Worried About Running Out of Food in the Last Year: Never true  Ran Out of Food in the Last Year: Never true  Transportation Needs: No Transportation Needs (07/27/2022)  Received from [facility] - Transportation  Lack of Transportation (Medical): No  Lack of Transportation (Non-Medical): No  Physical Activity: Sufficiently Active (04/10/2022)  Received from [facility]  Exercise Vital Sign  Days of Exercise per Week: 3 days  Minutes of Exercise per Session: 150+ min  Stress: No Stress Concern Present (04/10/2022)  Received from [facility] - Occupational Stress Questionnaire  Feeling of Stress : Not at all  Social Connections: Moderately Integrated (04/10/2022)  Received from [facility]  Social Connection and Isolation Panel [NHANES]  Frequency of Communication with Friends and Family: More than three times a week  Frequency of Social Gatherings with Friends and Family: More than three times a week  Attends Religious Services: More than 4 times per year  Active Member of [employer] or Organizations: Yes  Attends Banker Meetings: More than 4 times per year  Marital Status: Widowed   Objective:   Vitals:  08/04/22 1001 08/04/22 1002  BP: (!) 154/81  Pulse: 88  Temp: 36.9  C (98.4 F)  SpO2: 98%  Weight: 55.2 kg (121 lb 12.8 oz)  Height: 157.5 cm (5\' 2" )  PainSc: 0-No pain  PainLoc: Breast   Body mass index is 22.28 kg/m.  Physical Exam Exam conducted with a chaperone present.  HENT:  Head: Normocephalic.  Cardiovascular:  Rate and Rhythm: Normal rate.  Chest:  Breasts: Right: Normal.  Left: Normal.  Musculoskeletal:  General: Normal range of motion.   Cervical back: Normal range of motion.  Lymphadenopathy:  Upper Body:  Right upper body: No supraclavicular or axillary adenopathy.  Left upper body: No supraclavicular or axillary adenopathy.  Skin: General: Skin is warm.  Neurological:  Mental Status: She is alert.     Labs, Imaging and Diagnostic Testing:  Diagnosis Breast, right, needle core biopsy, 1:00 5 cmfn - INVASIVE DUCTAL CARCINOMA, SEE NOTE - TUBULE FORMATION: SCORE 2 - NUCLEAR PLEOMORPHISM: SCORE 2 - MITOTIC COUNT: SCORE 1 - TOTAL SCORE: 5 - OVERALL GRADE: 1 - LYMPHOVASCULAR INVASION: NOT IDENTIFIED - CANCER LENGTH: 0.4 CM - CALCIFICATIONS: NOT IDENTIFIED - OTHER FINDINGS: NONE Diagnosis Note Dr. Kenyon Ana reviewed the case and concurs with the above diagnosis. A breast prognostic profile (ER, PR, Ki-67 and HER2) is pending and will be reported in an addendum. [facility] was notified on 07/18/2022. Holley Bouche MD Pathologist, Electronic Signature  CLINICAL DATA: Possible mass in the upper, slightly medial right breast on a recent screening mammogram.  EXAM: DIGITAL DIAGNOSTIC UNILATERAL RIGHT MAMMOGRAM WITH TOMOSYNTHESIS; ULTRASOUND RIGHT BREAST LIMITED  TECHNIQUE: Right digital diagnostic mammography and breast tomosynthesis was performed.; Targeted ultrasound examination of the right breast was performed  COMPARISON: Previous exam(s).  ACR Breast Density Category b: There are scattered areas of fibroglandular density.  FINDINGS: 3D tomographic and 2D generated spot compression images of the right breast confirm a small, irregular mass in the upper, slightly inner right breast.  On physical exam, no mass is palpable in the upper inner right breast.  Targeted ultrasound is performed, showing 6 x 6 x 5 mm irregular, hypoechoic mass with indistinct margins and surrounding ill-defined increased echogenicity in the 1 o'clock position of the right breast, 5 cm from  the nipple. This corresponds to the mammographic mass.  Ultrasound of the right axilla demonstrated normal appearing right axillary lymph nodes.  IMPRESSION: 6 mm right breast mass with imaging features highly suspicious for malignancy.  RECOMMENDATION: Ultrasound-guided core needle biopsy of the 6 mm mass in the 1 o'clock position of the right breast. This has been discussed with the patient and she is being assisted in getting the biopsy scheduled.  I have discussed the findings and recommendations with the patient. If applicable, a reminder letter will be sent to the patient regarding the next appointment.  BI-RADS CATEGORY 5: Highly suggestive of malignancy.   Electronically Signed By: Beckie Salts M.D. On: 07/13/2022 11:06  Assessment and Plan:   Diagnoses and all orders for this visit:  Breast cancer, stage 1, right (CMS/HHS-HCC) - Ambulatory Referral to Radiation Oncology   Pt opted for right breast lumpectomy  Can omit SLN mapping after discussion of pro and cons  The procedure has been discussed with the patient. Alternatives to surgery have been discussed with the patient. Risks of surgery include bleeding, Infection, Seroma formation, death, and the need for further surgery. The patient understands and wishes to proceed.    Hayden Rasmussen, MD

## 2022-09-14 NOTE — Anesthesia Preprocedure Evaluation (Signed)
Anesthesia Evaluation  Patient identified by MRN, date of birth, ID band Patient awake    Reviewed: Allergy & Precautions, H&P , NPO status , Patient's Chart, lab work & pertinent test results  Airway Mallampati: II   Neck ROM: full    Dental   Pulmonary neg pulmonary ROS   breath sounds clear to auscultation       Cardiovascular hypertension,  Rhythm:regular Rate:Normal     Neuro/Psych  Headaches  Anxiety      Neuromuscular disease    GI/Hepatic   Endo/Other  Hypothyroidism    Renal/GU      Musculoskeletal  (+) Arthritis ,  Fibromyalgia -  Abdominal   Peds  Hematology   Anesthesia Other Findings   Reproductive/Obstetrics Breast CA                             Anesthesia Physical Anesthesia Plan  ASA: 2  Anesthesia Plan: General   Post-op Pain Management:    Induction: Intravenous  PONV Risk Score and Plan: 3 and Ondansetron, Dexamethasone and Treatment may vary due to age or medical condition  Airway Management Planned: LMA  Additional Equipment:   Intra-op Plan:   Post-operative Plan: Extubation in OR  Informed Consent: I have reviewed the patients History and Physical, chart, labs and discussed the procedure including the risks, benefits and alternatives for the proposed anesthesia with the patient or authorized representative who has indicated his/her understanding and acceptance.     Dental advisory given  Plan Discussed with: CRNA, Anesthesiologist and Surgeon  Anesthesia Plan Comments:        Anesthesia Quick Evaluation

## 2022-09-14 NOTE — Progress Notes (Signed)
Patient had Right breast seed localized lumpectomy today. Will follow for path.  Oncology Nurse Navigator Documentation     09/14/2022   10:00 AM  Oncology Nurse Navigator Flowsheets  Phase of Treatment Surgery  Surgery Actual Start Date: 09/14/2022  Navigator Follow Up Date: 09/18/2022  Navigator Follow Up Reason: Pathology  Navigator Location [facility]  Navigator Encounter Type Appt/Treatment Plan Review  Treatment Initiated Date 09/14/2022  Patient Visit Type MedOnc  Treatment Phase Active Tx  Barriers/Navigation Needs No Barriers At This Time  Interventions None Required  Acuity Level 1-No Barriers  Support Groups/Services Friends and Family  Time Spent with Patient 15

## 2022-09-14 NOTE — Op Note (Signed)
Pre-op diagnosis: Stage I right breast cancer overlapping quadrants  Postoperative diagnosis: Same  Procedure: Right breast seed localized lumpectomy  Surgeon: Harriette Bouillon, MD  Anesthesia: LMA with 0.25% Marcaine with epinephrine  Drains: None  Specimen: Right breast mass with clip in place.  Seed was removed separately.  Drains: None  Indications for procedure: The patient is an 88 year old female with stage I right breast cancer.  She opted for lumpectomy alone after reviewing all of her options and discussing the pros and cons of sentinel lymph node mapping given stage I disease and her advanced age.The procedure has been discussed with the patient. Alternatives to surgery have been discussed with the patient.  Risks of surgery include bleeding,  Infection,  Seroma formation, death,  and the need for further surgery.   The patient understands and wishes to proceed.      Description of procedure: The patient was met in the holding area and questions were answered.  Right breast was marked as correct site and seed was placed as an outpatient.  Films were available for review.  She was taken back to the operative room.  She is placed supine upon the operating table.  After induction of general anesthesia, right breast was prepped and draped in sterile fashion and timeout performed.  Proper patient, site and procedure verified.  Neoprobe used to identify the seed right breast upper outer and upper inner quadrant overlapping site.  Transverse incision made over the signal.  Dissection carried down all tissue and the seed and clip were excised with grossly negative margin.  The seed was extruded and sent separately.  Images of both were obtained and sent to radiology.  The tissue was oriented with ink.  Of note the anterior margin is the skin now.  Hemostasis achieved with cautery.  Irrigation used.  Clips placed.  Local anesthetic infiltrated.  Deep tissue planes approximated with 3-0 Vicryl.   4 Monocryl used to close the skin in a subcuticular fashion.  Dermabond applied.  All counts found to be correct.  The patient was awoke extubated taken to recovery in satisfactory condition.

## 2022-09-14 NOTE — Transfer of Care (Signed)
Immediate Anesthesia Transfer of Care Note  Patient: Lindsay Maldonado  Procedure(s) Performed: RIGHT BREAST LUMPECTOMY WITH RADIOACTIVE SEED LOCALIZATION (Right: Breast)  Patient Location: PACU  Anesthesia Type:General  Level of Consciousness: awake, alert , and oriented  Airway & Oxygen Therapy: Patient Spontanous Breathing  Post-op Assessment: Report given to RN, Post -op Vital signs reviewed and stable, and Patient moving all extremities X 4  Post vital signs: Reviewed and stable  Last Vitals:  Vitals Value Taken Time  BP 149/82 09/14/22 0835  Temp    Pulse 70 09/14/22 0839  Resp 9 09/14/22 0839  SpO2 99 % 09/14/22 0839  Vitals shown include unfiled device data.  Last Pain:  Vitals:   09/14/22 0626  TempSrc:   PainSc: 0-No pain         Complications: No notable events documented.

## 2022-09-15 ENCOUNTER — Encounter (HOSPITAL_COMMUNITY): Payer: Self-pay | Admitting: Surgery

## 2022-09-15 NOTE — Anesthesia Postprocedure Evaluation (Signed)
Anesthesia Post Note  Patient: Lindsay Maldonado  Procedure(s) Performed: RIGHT BREAST LUMPECTOMY WITH RADIOACTIVE SEED LOCALIZATION (Right: Breast)     Patient location during evaluation: PACU Anesthesia Type: General Level of consciousness: awake and alert Pain management: pain level controlled Vital Signs Assessment: post-procedure vital signs reviewed and stable Respiratory status: spontaneous breathing, nonlabored ventilation, respiratory function stable and patient connected to nasal cannula oxygen Cardiovascular status: blood pressure returned to baseline and stable Postop Assessment: no apparent nausea or vomiting Anesthetic complications: no   No notable events documented.  Last Vitals:  Vitals:   09/14/22 0900 09/14/22 0907  BP: (!) 147/76   Pulse: 67 66  Resp: 10 11  Temp: 36.4 C   SpO2: 98% 96%    Last Pain:  Vitals:   09/14/22 0900  TempSrc:   PainSc: 4                  Evangeline Utley S

## 2022-09-22 ENCOUNTER — Encounter: Payer: Self-pay | Admitting: Surgery

## 2022-09-25 ENCOUNTER — Encounter: Payer: Self-pay | Admitting: *Deleted

## 2022-09-25 NOTE — Progress Notes (Signed)
Patient pathology reviewed with Dr Myna Hidalgo. He would like ER/PR/HER2 indicators run on the incidental lesion found in close proximity to the primary lesion. [facility] contacted and request submitted.   Per Dr Myna Hidalgo, request for Oncotype Recurrence Score sent on specimen [phone] DOS 09/14/2022. Request has been made to run testing on both lesions.   Oncology Nurse Navigator Documentation     09/25/2022    1:45 PM  Oncology Nurse Navigator Flowsheets  Navigator Follow Up Date: 09/29/2022  Navigator Follow Up Reason: Pathology  Navigator Location [facility]  Navigator Encounter Type Molecular Studies  Patient Visit Type MedOnc  Treatment Phase Active Tx  Barriers/Navigation Needs No Barriers At This Time  Interventions Coordination of Care  Acuity Level 1-No Barriers  Coordination of Care Pathology  Support Groups/Services Friends and Family  Time Spent with Patient 30

## 2022-09-28 ENCOUNTER — Encounter: Payer: Self-pay | Admitting: *Deleted

## 2022-09-28 NOTE — Progress Notes (Signed)
Patient's prognostics on additional lesion returned ER/PR+ HER2-  Oncology Nurse Navigator Documentation     09/28/2022   10:30 AM  Oncology Nurse Navigator Flowsheets  Navigator Follow Up Date: 10/17/2022  Navigator Follow Up Reason: Follow-up Appointment  Navigator Location [facility]  Navigator Encounter Type Pathology Review  Patient Visit Type MedOnc  Treatment Phase Active Tx  Barriers/Navigation Needs No Barriers At This Time  Interventions Coordination of Care  Acuity Level 1-No Barriers  Coordination of Care Pathology  Support Groups/Services Friends and Family  Time Spent with Patient 15

## 2022-10-03 DIAGNOSIS — C50911 Malignant neoplasm of unspecified site of right female breast: Secondary | ICD-10-CM | POA: Diagnosis not present

## 2022-10-03 DIAGNOSIS — Z170 Estrogen receptor positive status [ER+]: Secondary | ICD-10-CM | POA: Diagnosis not present

## 2022-10-04 ENCOUNTER — Encounter (HOSPITAL_COMMUNITY): Payer: Self-pay

## 2022-10-13 DIAGNOSIS — C50911 Malignant neoplasm of unspecified site of right female breast: Secondary | ICD-10-CM | POA: Diagnosis not present

## 2022-10-13 DIAGNOSIS — Z170 Estrogen receptor positive status [ER+]: Secondary | ICD-10-CM | POA: Diagnosis not present

## 2022-10-16 ENCOUNTER — Encounter (HOSPITAL_COMMUNITY): Payer: Self-pay

## 2022-10-16 ENCOUNTER — Other Ambulatory Visit: Payer: Self-pay

## 2022-10-16 DIAGNOSIS — C50011 Malignant neoplasm of nipple and areola, right female breast: Secondary | ICD-10-CM

## 2022-10-17 ENCOUNTER — Inpatient Hospital Stay: Payer: Medicare Other | Attending: Hematology & Oncology

## 2022-10-17 ENCOUNTER — Other Ambulatory Visit: Payer: Self-pay

## 2022-10-17 ENCOUNTER — Inpatient Hospital Stay (HOSPITAL_BASED_OUTPATIENT_CLINIC_OR_DEPARTMENT_OTHER): Payer: Medicare Other | Admitting: Hematology & Oncology

## 2022-10-17 ENCOUNTER — Encounter: Payer: Self-pay | Admitting: *Deleted

## 2022-10-17 VITALS — BP 147/76 | HR 66 | Temp 98.2°F | Resp 16 | Ht 62.0 in | Wt 122.0 lb

## 2022-10-17 DIAGNOSIS — C50011 Malignant neoplasm of nipple and areola, right female breast: Secondary | ICD-10-CM

## 2022-10-17 DIAGNOSIS — Z79811 Long term (current) use of aromatase inhibitors: Secondary | ICD-10-CM | POA: Insufficient documentation

## 2022-10-17 DIAGNOSIS — C50211 Malignant neoplasm of upper-inner quadrant of right female breast: Secondary | ICD-10-CM | POA: Diagnosis not present

## 2022-10-17 DIAGNOSIS — C50911 Malignant neoplasm of unspecified site of right female breast: Secondary | ICD-10-CM | POA: Diagnosis not present

## 2022-10-17 DIAGNOSIS — Z171 Estrogen receptor negative status [ER-]: Secondary | ICD-10-CM | POA: Diagnosis not present

## 2022-10-17 LAB — CBC WITH DIFFERENTIAL (CANCER CENTER ONLY)
Abs Immature Granulocytes: 0.01 10*3/uL (ref 0.00–0.07)
Basophils Absolute: 0 10*3/uL (ref 0.0–0.1)
Basophils Relative: 0 %
Eosinophils Absolute: 0.2 10*3/uL (ref 0.0–0.5)
Eosinophils Relative: 4 %
HCT: 44.2 % (ref 36.0–46.0)
Hemoglobin: 14 g/dL (ref 12.0–15.0)
Immature Granulocytes: 0 %
Lymphocytes Relative: 20 %
Lymphs Abs: 0.9 10*3/uL (ref 0.7–4.0)
MCH: 29.7 pg (ref 26.0–34.0)
MCHC: 31.7 g/dL (ref 30.0–36.0)
MCV: 93.6 fL (ref 80.0–100.0)
Monocytes Absolute: 0.4 10*3/uL (ref 0.1–1.0)
Monocytes Relative: 9 %
Neutro Abs: 3.1 10*3/uL (ref 1.7–7.7)
Neutrophils Relative %: 67 %
Platelet Count: 210 10*3/uL (ref 150–400)
RBC: 4.72 MIL/uL (ref 3.87–5.11)
RDW: 12.4 % (ref 11.5–15.5)
WBC Count: 4.7 10*3/uL (ref 4.0–10.5)
nRBC: 0 % (ref 0.0–0.2)

## 2022-10-17 LAB — CMP (CANCER CENTER ONLY)
ALT: 16 U/L (ref 0–44)
AST: 18 U/L (ref 15–41)
Albumin: 4.5 g/dL (ref 3.5–5.0)
Alkaline Phosphatase: 65 U/L (ref 38–126)
Anion gap: 6 (ref 5–15)
BUN: 16 mg/dL (ref 8–23)
CO2: 33 mmol/L — ABNORMAL HIGH (ref 22–32)
Calcium: 10 mg/dL (ref 8.9–10.3)
Chloride: 100 mmol/L (ref 98–111)
Creatinine: 0.79 mg/dL (ref 0.44–1.00)
GFR, Estimated: >60 mL/min (ref >60)
Glucose, Bld: 76 mg/dL (ref 70–99)
Potassium: 4.2 mmol/L (ref 3.5–5.1)
Sodium: 139 mmol/L (ref 135–145)
Total Bilirubin: 0.5 mg/dL (ref 0.3–1.2)
Total Protein: 6.9 g/dL (ref 6.5–8.1)

## 2022-10-17 LAB — LACTATE DEHYDROGENASE: LDH: 162 U/L (ref 98–192)

## 2022-10-17 MED ORDER — LETROZOLE 2.5 MG PO TABS: 2.5000 mg | ORAL_TABLET | Freq: Every day | ORAL | 12 refills | Status: DC

## 2022-10-17 NOTE — Progress Notes (Signed)
Patient is here for office follow up after her surgery. She was found to have two distinct lesions and both were sent for Oncotype Recurrence Score. They returned as 9 and 16.   Referrals to nutrition and social work placed per protocol.   Patient will defer radiation therapy. Will start AI.   Oncology Nurse Navigator Documentation     10/17/2022   10:45 AM  Oncology Nurse Navigator Flowsheets  Planned Course of Treatment AI  Phase of Treatment AI  Aromatase Inhibitor Actual Start Date: 10/17/2022  Navigator Follow Up Date: 11/28/2022  Navigator Follow Up Reason: Follow-up Appointment  Navigator Location [facility]  Navigator Encounter Type Appt/Treatment Plan Review  Patient Visit Type MedOnc  Treatment Phase Active Tx  Barriers/Navigation Needs No Barriers At This Time  Interventions Referrals  Acuity Level 1-No Barriers  Referrals Nutrition/dietician;Social Work  Support Groups/Services Friends and Family  Time Spent with Patient 15  Genetic Counseling Type None

## 2022-10-17 NOTE — Progress Notes (Signed)
Hematology and Oncology Follow Up Visit  Lindsay Maldonado [id number] January 03, 1935 88 y.o. 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 daily-start on 10/22/2022     Interim History:  Lindsay Maldonado is back for her second office visit.  She did have her lumpectomy.  This was done on 09/14/2022.  The pathology report ([id number]) showed the first lesion was a 9 mm invasive ductal carcinoma.  All margins were negative.  There is no lymphovascular space invasion.  A second lesion was invasive lobular carcinoma that measured 7 mm.  It was grade 2.  Again all margins were negative.  We did do Oncotype score on both lesions.  She has a very low Oncotype score.  As such, I do not believe that there is any benefit for chemotherapy in addition to antiestrogen therapy.  Also I am not sure that she needs any radiation therapy given the small lesions and the fact that margins were all negative.  She really looks great.  She recovered from surgery quite nicely.  I have to give her a lot of credit for this.  She has had no problems with nausea or vomiting.  She has had no problems with cough or shortness of breath.  She has had no change in bowel or bladder habits.  Overall, I would say that her performance status is prior ECOG 1.  Medications:  Current Outpatient Medications:    letrozole (FEMARA) 2.5 MG tablet, Take 1 tablet (2.5 mg total) by mouth daily., Disp: 30 tablet, Rfl: 12   Cholecalciferol (VITAMIN D3) 50 MCG (2000 UT) capsule, Take 1 capsule (2,000 Units total) by mouth daily., Disp: 100 capsule, Rfl: 3   LORazepam (ATIVAN) 1 MG tablet, TAKE ONE TABLET BY MOUTH TWICE A DAY AS NEEDED FOR ANXIETY OR SLEEP, Disp: 60 tablet, Rfl: 3   SYNTHROID 75 MCG tablet, Take 1 tablet (75 mcg total) by mouth daily. Annual appt due in Feb must see provider for future refills, Disp:  90 tablet, Rfl: 3   triamterene-hydrochlorothiazide (MAXZIDE-25) 37.5-25 MG tablet, TAKE HALF TABLET BY MOUTH DAILY, Disp: 45 tablet, Rfl: 3  Allergies:  Allergies  Allergen Reactions   Morphine Nausea And Vomiting    Past Medical History, Surgical history, Social history, and Family History were reviewed and updated.  Review of Systems: Review of Systems  Constitutional: Negative.   HENT:  Negative.    Eyes: Negative.   Respiratory: Negative.    Cardiovascular: Negative.   Gastrointestinal: Negative.   Endocrine: Negative.   Genitourinary: Negative.    Musculoskeletal: Negative.   Skin: Negative.   Neurological: Negative.   Hematological: Negative.   Psychiatric/Behavioral: Negative.      Physical Exam:  height is 5\' 2"  (1.575 m) and weight is 122 lb (55.3 kg). Her oral temperature is 98.2 F (36.8 C). Her blood pressure is 147/76 (abnormal) and her pulse is 66. Her respiration is 16 and oxygen saturation is 99%.   Wt Readings from Last 3 Encounters:  10/17/22 122 lb (55.3 kg)  09/14/22 120 lb (54.4 kg)  09/07/22 123 lb 6.4 oz (56 kg)    Physical Exam Vitals reviewed.  Constitutional:      Comments: Breast exam send left breast with no masses, edema or erythema.  There is no left axillary adenopathy.  Her right breast shows a healing lumpectomy scar at about the 1 o'clock  position.  There is no masses in the right breast.  There is no right axillary adenopathy.  HENT:     Head: Normocephalic and atraumatic.  Eyes:     Pupils: Pupils are equal, round, and reactive to light.  Cardiovascular:     Rate and Rhythm: Normal rate and regular rhythm.     Heart sounds: Normal heart sounds.  Pulmonary:     Effort: Pulmonary effort is normal.     Breath sounds: Normal breath sounds.  Abdominal:     General: Bowel sounds are normal.     Palpations: Abdomen is soft.  Musculoskeletal:        General: No tenderness or deformity. Normal range of motion.     Cervical back:  Normal range of motion.  Lymphadenopathy:     Cervical: No cervical adenopathy.  Skin:    General: Skin is warm and dry.     Findings: No erythema or rash.  Neurological:     Mental Status: She is alert and oriented to person, place, and time.  Psychiatric:        Behavior: Behavior normal.        Thought Content: Thought content normal.        Judgment: Judgment normal.      Lab Results  Component Value Date   WBC 4.7 10/17/2022   HGB 14.0 10/17/2022   HCT 44.2 10/17/2022   MCV 93.6 10/17/2022   PLT 210 10/17/2022     Chemistry      Component Value Date/Time   NA 139 10/17/2022 1008   K 4.2 10/17/2022 1008   CL 100 10/17/2022 1008   CO2 33 (H) 10/17/2022 1008   BUN 16 10/17/2022 1008   CREATININE 0.79 10/17/2022 1008   CREATININE 0.89 (H) 09/11/2019 0940      Component Value Date/Time   CALCIUM 10.0 10/17/2022 1008   ALKPHOS 65 10/17/2022 1008   AST 18 10/17/2022 1008   ALT 16 10/17/2022 1008   BILITOT 0.5 10/17/2022 1008       Impression and Plan: Lindsay Maldonado is a very nice 88 year old postmenopausal white female.  She has a multifocal invasive carcinoma of the right breast.  These are both low-grade.  They are both early stage I.  They have low Oncotype scores.  Again, I think that Femara would be appropriate for her.  I do not see that there is a defined role for radiation therapy given her age.  I talked her about Femara.  I think that she would tolerate Femara well.  She is taking vitamin D.  We will go ahead and plan to have her come back to see [place] in about 6 weeks.  If all is good 6 weeks, then maybe we will be able to move her out to every 83-month appointments.  I do believe that the risk of recurrence of these lesions will easily be less than 10%.   Lindsay Macho, MD 8/27/20241:30 PM

## 2022-10-18 ENCOUNTER — Inpatient Hospital Stay: Payer: Medicare Other | Admitting: Licensed Clinical Social Worker

## 2022-10-18 LAB — CANCER ANTIGEN 27.29: CA 27.29: 18.8 U/mL (ref 0.0–38.6)

## 2022-10-18 NOTE — Progress Notes (Signed)
[facility] Clinical Social Work  Clinical Social Work was referred by Statistician for assessment of psychosocial needs.  Clinical Social Worker contacted patient by phone to offer support and assess for needs.    Contacted patient per new patient protocol.  Currently, she is not requiring chemotherapy or radiation post breast cancer surgery.  She feels she is healing well and feels good.  She expressed having all of her needs met and an excellent support system in place.       Dorothey Baseman, LCSW  Clinical Social Worker [facility]

## 2022-10-19 ENCOUNTER — Encounter: Payer: Self-pay | Admitting: *Deleted

## 2022-10-19 NOTE — Progress Notes (Signed)
Patient called with some questions related to her Oncotype Recurrence Score. Answered her questions to her satisfaction and encouraged her to call if she had any further questions.   Oncology Nurse Navigator Documentation     10/19/2022    8:15 AM  Oncology Nurse Navigator Flowsheets  Navigator Follow Up Date: 11/28/2022  Navigator Follow Up Reason: Follow-up Appointment  Navigator Location [facility]  Navigator Encounter Type Telephone  Telephone Incoming Call  Patient Visit Type MedOnc  Treatment Phase Active Tx  Barriers/Navigation Needs No Barriers At This Time  Education Other  Interventions Education  Acuity Level 1-No Barriers  Education Method Verbal  Support Groups/Services Friends and Family  Time Spent with Patient 15

## 2022-10-26 ENCOUNTER — Inpatient Hospital Stay: Payer: Medicare Other | Attending: Hematology & Oncology | Admitting: Dietician

## 2022-10-26 DIAGNOSIS — K080 Exfoliation of teeth due to systemic causes: Secondary | ICD-10-CM | POA: Diagnosis not present

## 2022-10-26 NOTE — Progress Notes (Signed)
Nutrition Assessment: Reached out to patient at home telephone number.    Reason for Assessment: New Patient Assessment   ASSESSMENT: Patient is 88 year old female with early stage breast cancer s/p lumpectomy who MD reports is healing well.  PMHx includes HTN, Osteopenia (she said MD told her if she wasn't taking meds no follow up).  No plans for systemic therapy or radiation.  Treatment to include just AI, and followed by Dr. Myna Hidalgo, and has a grand daughter who is a Data processing manager. Denies NIS other than constipation which has been chronic. No food allergies. Two meal a day pattern. Usual intake: Not hungry in morning, only eats early if having to go to an appointment   Loves breakfast foods (bacon or sausage) Large salad with chicken and chicken Likes fruits but only eats 1-2 times/week Fluids: Fairlife milk 16oz, Coffee 20-30oz., forces herself to drink water volume varies greatly 8-50oz.   Nutrition Focused Physical Exam: unable to perform NFPE   Medications: Vit D, Femara, Synthroid   Labs: reviewed 10/17/22   Anthropometrics: weight stable and healthy  Height: 62" Weight: 122# UBW: Weight range 2024 120-123# BMI: 22.31 (WNL)   NUTRITION DIAGNOSIS: none at this time   INTERVENTION:   Relayed that nutrition services are wrap around service provided at no charge and encouraged continued communication if experiencing any nutritional impact symptoms (NIS). Educated on importance of adequate nourishment with calorie and protein energy intake with nutrient dense foods when possible to maintain weight/strength and QOL.   Reviewed strategies for constipation and improved bone health. Offered information on [employer], will send flyers through email with tips for constipation and contact information provided.  Next Visit: PRN at patient or provider request.  Gennaro Africa, RDN, LDN Registered Dietitian, [facility] Part Time Remote (Usual  office hours: Tuesday-Thursday) Mobile: [phone]

## 2022-11-10 ENCOUNTER — Other Ambulatory Visit: Payer: Self-pay | Admitting: Internal Medicine

## 2022-11-13 ENCOUNTER — Telehealth: Payer: Self-pay

## 2022-11-13 ENCOUNTER — Telehealth: Payer: Self-pay | Admitting: Internal Medicine

## 2022-11-13 NOTE — Telephone Encounter (Signed)
Pt called in stating that she had UTI symptoms and wanted to know if this is related to her breast cancer diagnosis and would like to know what to do. Advised pt this is not related to her dx and that she should follow up with her PCP regarding her s/s. Pt inquired about what to take OTC. Recommended AZO but advised her that it would help alleviate s/s, NOT treat the infection and to make sure she had a urinalysis to check for bacteria. Pt agreed and stated understanding.

## 2022-11-13 NOTE — Telephone Encounter (Signed)
Pt stated she's having uti symptoms and don't think she need a appointment she's wondering what advice her doctor or nurse have for her.

## 2022-11-14 MED ORDER — AMPICILLIN 500 MG PO CAPS: 500.0000 mg | ORAL_CAPSULE | Freq: Three times a day (TID) | ORAL | 0 refills | Status: DC

## 2022-11-14 NOTE — Telephone Encounter (Signed)
I will send a prescription for antibiotic.  Office visit if not well.  Thank you

## 2022-11-14 NOTE — Telephone Encounter (Signed)
I was able to speak with the pt and inform her of Dr. Loren Racer instruction's. Pt states she understood and has no questions or concerns at this time.

## 2022-11-28 ENCOUNTER — Inpatient Hospital Stay: Payer: Medicare Other | Attending: Hematology & Oncology

## 2022-11-28 ENCOUNTER — Other Ambulatory Visit: Payer: Self-pay

## 2022-11-28 ENCOUNTER — Inpatient Hospital Stay: Payer: Medicare Other | Attending: Hematology & Oncology | Admitting: Hematology & Oncology

## 2022-11-28 ENCOUNTER — Inpatient Hospital Stay: Payer: Medicare Other

## 2022-11-28 ENCOUNTER — Encounter: Payer: Self-pay | Admitting: Hematology & Oncology

## 2022-11-28 ENCOUNTER — Encounter: Payer: Self-pay | Admitting: *Deleted

## 2022-11-28 VITALS — BP 139/68 | HR 80 | Temp 98.6°F | Resp 17 | Ht 62.0 in | Wt 122.0 lb

## 2022-11-28 DIAGNOSIS — R300 Dysuria: Secondary | ICD-10-CM | POA: Insufficient documentation

## 2022-11-28 DIAGNOSIS — C50911 Malignant neoplasm of unspecified site of right female breast: Secondary | ICD-10-CM | POA: Diagnosis not present

## 2022-11-28 DIAGNOSIS — C50211 Malignant neoplasm of upper-inner quadrant of right female breast: Secondary | ICD-10-CM | POA: Insufficient documentation

## 2022-11-28 DIAGNOSIS — Z1722 Progesterone receptor negative status: Secondary | ICD-10-CM | POA: Diagnosis not present

## 2022-11-28 DIAGNOSIS — Z170 Estrogen receptor positive status [ER+]: Secondary | ICD-10-CM | POA: Insufficient documentation

## 2022-11-28 DIAGNOSIS — Z79811 Long term (current) use of aromatase inhibitors: Secondary | ICD-10-CM | POA: Insufficient documentation

## 2022-11-28 HISTORY — DX: Progesterone receptor negative status: Z17.22

## 2022-11-28 LAB — CMP (CANCER CENTER ONLY)
ALT: 19 U/L (ref 0–44)
AST: 18 U/L (ref 15–41)
Albumin: 4.1 g/dL (ref 3.5–5.0)
Alkaline Phosphatase: 59 U/L (ref 38–126)
Anion gap: 7 (ref 5–15)
BUN: 13 mg/dL (ref 8–23)
CO2: 32 mmol/L (ref 22–32)
Calcium: 9.7 mg/dL (ref 8.9–10.3)
Chloride: 99 mmol/L (ref 98–111)
Creatinine: 0.75 mg/dL (ref 0.44–1.00)
GFR, Estimated: >60 mL/min (ref >60)
Glucose, Bld: 85 mg/dL (ref 70–99)
Potassium: 4.8 mmol/L (ref 3.5–5.1)
Sodium: 138 mmol/L (ref 135–145)
Total Bilirubin: 0.5 mg/dL (ref 0.3–1.2)
Total Protein: 6.9 g/dL (ref 6.5–8.1)

## 2022-11-28 LAB — CBC WITH DIFFERENTIAL (CANCER CENTER ONLY)
Abs Immature Granulocytes: 0.01 10*3/uL (ref 0.00–0.07)
Basophils Absolute: 0 10*3/uL (ref 0.0–0.1)
Basophils Relative: 1 %
Eosinophils Absolute: 0.2 10*3/uL (ref 0.0–0.5)
Eosinophils Relative: 4 %
HCT: 41.9 % (ref 36.0–46.0)
Hemoglobin: 13.6 g/dL (ref 12.0–15.0)
Immature Granulocytes: 0 %
Lymphocytes Relative: 17 %
Lymphs Abs: 0.7 10*3/uL (ref 0.7–4.0)
MCH: 29.7 pg (ref 26.0–34.0)
MCHC: 32.5 g/dL (ref 30.0–36.0)
MCV: 91.5 fL (ref 80.0–100.0)
Monocytes Absolute: 0.4 10*3/uL (ref 0.1–1.0)
Monocytes Relative: 10 %
Neutro Abs: 3 10*3/uL (ref 1.7–7.7)
Neutrophils Relative %: 68 %
Platelet Count: 217 10*3/uL (ref 150–400)
RBC: 4.58 MIL/uL (ref 3.87–5.11)
RDW: 12.5 % (ref 11.5–15.5)
WBC Count: 4.4 10*3/uL (ref 4.0–10.5)
nRBC: 0 % (ref 0.0–0.2)

## 2022-11-28 LAB — LACTATE DEHYDROGENASE: LDH: 152 U/L (ref 98–192)

## 2022-11-28 LAB — URINALYSIS, COMPLETE (UACMP) WITH MICROSCOPIC
Bilirubin Urine: NEGATIVE
Glucose, UA: NEGATIVE mg/dL
Hgb urine dipstick: NEGATIVE
Ketones, ur: NEGATIVE mg/dL
Leukocytes,Ua: NEGATIVE
Nitrite: NEGATIVE
Protein, ur: NEGATIVE mg/dL
Specific Gravity, Urine: 1.02 (ref 1.005–1.030)
pH: 7.5 (ref 5.0–8.0)

## 2022-11-28 NOTE — Progress Notes (Signed)
Patient continues on current regimen.   Oncology Nurse Navigator Documentation     11/28/2022   12:30 PM  Oncology Nurse Navigator Flowsheets  Navigator Follow Up Date: 02/27/2023  Navigator Follow Up Reason: Follow-up Appointment  Navigator Location [facility]  Navigator Encounter Type Appt/Treatment Plan Review  Patient Visit Type MedOnc  Treatment Phase Active Tx  Barriers/Navigation Needs No Barriers At This Time  Interventions None Required  Acuity Level 1-No Barriers  Support Groups/Services Friends and Family  Time Spent with Patient 15

## 2022-11-28 NOTE — Progress Notes (Signed)
Hematology and Oncology Follow Up Visit  Lindsay Maldonado [id number] 10/03/1934 88 y.o. 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 daily-start on 10/22/2022     Interim History:  Lindsay Maldonado is back for follow-up.  She is doing pretty well.  Her main complaint has been a urinary tract infection.  She says that she has been having problems for about 2 weeks.  She has been on some antibiotics but this only worked temporarily.  We will go ahead and get a urinalysis and a urine culture on her.  She started the Femara.  She is doing well with the Femara.  She has had no problems with bony pain.  She has had no arthralgias or myalgias.  There is been no cough or shortness of breath.  She has had no nausea or vomiting.  She has had no rashes.  There has been no leg swelling.  Overall, I would say that her performance status is probably ECOG 1.  Medications:  Current Outpatient Medications:    Melatonin 5 MG CAPS, Take 5 mg by mouth at bedtime as needed., Disp: , Rfl:    phenazopyridine (PYRIDIUM) 97 MG tablet, Take 97 mg by mouth 3 (three) times daily as needed for pain., Disp: , Rfl:    UNABLE TO FIND, Take 1 capsule by mouth 3 (three) times daily. Med Name: JP-X, Disp: , Rfl:    Cholecalciferol (VITAMIN D3) 50 MCG (2000 UT) capsule, Take 1 capsule (2,000 Units total) by mouth daily., Disp: 100 capsule, Rfl: 3   letrozole (FEMARA) 2.5 MG tablet, Take 1 tablet (2.5 mg total) by mouth daily., Disp: 30 tablet, Rfl: 12   LORazepam (ATIVAN) 1 MG tablet, TAKE ONE TABLET BY MOUTH TWICE DAILY as needed for anxiety or sleep, Disp: 60 tablet, Rfl: 3   SYNTHROID 75 MCG tablet, Take 1 tablet (75 mcg total) by mouth daily. Annual appt due in Feb must see provider for future refills, Disp: 90 tablet, Rfl: 3   triamterene-hydrochlorothiazide (MAXZIDE-25) 37.5-25 MG tablet, TAKE  HALF TABLET BY MOUTH DAILY, Disp: 45 tablet, Rfl: 3  Allergies:  Allergies  Allergen Reactions   Morphine Nausea And Vomiting    Past Medical History, Surgical history, Social history, and Family History were reviewed and updated.  Review of Systems: Review of Systems  Constitutional: Negative.   HENT:  Negative.    Eyes: Negative.   Respiratory: Negative.    Cardiovascular: Negative.   Gastrointestinal: Negative.   Endocrine: Negative.   Genitourinary: Negative.    Musculoskeletal: Negative.   Skin: Negative.   Neurological: Negative.   Hematological: Negative.   Psychiatric/Behavioral: Negative.      Physical Exam:  height is 5\' 2"  (1.575 m) and weight is 122 lb (55.3 kg). Her oral temperature is 98.6 F (37 C). Her blood pressure is 139/68 and her pulse is 80. Her respiration is 17 and oxygen saturation is 100%.   Wt Readings from Last 3 Encounters:  11/28/22 122 lb (55.3 kg)  10/17/22 122 lb (55.3 kg)  09/14/22 120 lb (54.4 kg)    Physical Exam Vitals reviewed.  Constitutional:      Comments: Breast exam send left breast with no masses, edema or erythema.  There is no left axillary adenopathy.  Her right breast shows a healing lumpectomy scar at about the 1 o'clock position.  There is no masses  in the right breast.  There is no right axillary adenopathy.  HENT:     Head: Normocephalic and atraumatic.  Eyes:     Pupils: Pupils are equal, round, and reactive to light.  Cardiovascular:     Rate and Rhythm: Normal rate and regular rhythm.     Heart sounds: Normal heart sounds.  Pulmonary:     Effort: Pulmonary effort is normal.     Breath sounds: Normal breath sounds.  Abdominal:     General: Bowel sounds are normal.     Palpations: Abdomen is soft.  Musculoskeletal:        General: No tenderness or deformity. Normal range of motion.     Cervical back: Normal range of motion.  Lymphadenopathy:     Cervical: No cervical adenopathy.  Skin:    General: Skin is  warm and dry.     Findings: No erythema or rash.  Neurological:     Mental Status: She is alert and oriented to person, place, and time.  Psychiatric:        Behavior: Behavior normal.        Thought Content: Thought content normal.        Judgment: Judgment normal.      Lab Results  Component Value Date   WBC 4.4 11/28/2022   HGB 13.6 11/28/2022   HCT 41.9 11/28/2022   MCV 91.5 11/28/2022   PLT 217 11/28/2022     Chemistry      Component Value Date/Time   NA 138 11/28/2022 1004   K 4.8 11/28/2022 1004   CL 99 11/28/2022 1004   CO2 32 11/28/2022 1004   BUN 13 11/28/2022 1004   CREATININE 0.75 11/28/2022 1004   CREATININE 0.89 (H) 09/11/2019 0940      Component Value Date/Time   CALCIUM 9.7 11/28/2022 1004   ALKPHOS 59 11/28/2022 1004   AST 18 11/28/2022 1004   ALT 19 11/28/2022 1004   BILITOT 0.5 11/28/2022 1004       Impression and Plan: Lindsay Maldonado is a very nice 88 year old postmenopausal white female.  She has a multifocal invasive carcinoma of the right breast.  These are both low-grade.  They are both early stage I.  They have low Oncotype scores.  She is on Femara.  I think he is doing well with the Femara.  Not sure what to make of this dysuria.  We did a urinalysis on her.  The urinalysis looks fairly clean.  We will see what the culture shows.  From the standpoint of the invasive carcinoma, I think we can get her back now after the Holidays.  I think this would be very reasonable.  If she has any problems, we can certainly see her before then.  We will call her with the urine culture result.   Josph Macho, MD 10/8/202412:13 PM

## 2022-11-29 ENCOUNTER — Telehealth: Payer: Self-pay | Admitting: *Deleted

## 2022-11-29 LAB — URINE CULTURE: Culture: NO GROWTH

## 2022-11-29 NOTE — Telephone Encounter (Signed)
-----   Message from Josph Macho sent at 11/29/2022  3:38 PM EDT ----- Please call him and let him know that the urine culture is negative.  Thanks.  Cindee Lame

## 2022-12-07 DIAGNOSIS — H52223 Regular astigmatism, bilateral: Secondary | ICD-10-CM | POA: Diagnosis not present

## 2022-12-21 DIAGNOSIS — H5202 Hypermetropia, left eye: Secondary | ICD-10-CM | POA: Diagnosis not present

## 2023-02-12 ENCOUNTER — Telehealth: Payer: Self-pay | Admitting: Internal Medicine

## 2023-02-12 NOTE — Telephone Encounter (Signed)
Copied from CRM [id number]. Topic: Clinical - Medication Question >> Feb 12, 2023  4:23 PM Adaysia C wrote: Reason for CRM: PT has been diagnosed with COVID and needs to be prescribed a medication that won't effect her existing kidney problems. PT has requested a call back to follow up before the medication is prescribed #[phone])

## 2023-02-12 NOTE — Telephone Encounter (Signed)
PATIENT TESTED POSITIVE FOR COVID AND FEELS REALLY BAD SHE WOULD LIKE A CALL BACK TO SEE IF SHE NEEDS A PRESCRIPTION CALLED IN

## 2023-02-13 ENCOUNTER — Other Ambulatory Visit: Payer: Self-pay | Admitting: Internal Medicine

## 2023-02-13 DIAGNOSIS — J208 Acute bronchitis due to other specified organisms: Secondary | ICD-10-CM | POA: Insufficient documentation

## 2023-02-13 MED ORDER — NIRMATRELVIR/RITONAVIR (PAXLOVID)TABLET
3.0000 | ORAL_TABLET | Freq: Two times a day (BID) | ORAL | 0 refills | Status: AC
Start: 2023-02-13 — End: 2023-02-18

## 2023-02-13 NOTE — Progress Notes (Unsigned)
CrCl cannot be calculated (Patient's most recent lab result is older than the maximum 21 days allowed.).

## 2023-02-27 ENCOUNTER — Inpatient Hospital Stay: Payer: Medicare Other

## 2023-02-27 ENCOUNTER — Inpatient Hospital Stay: Payer: Medicare Other | Admitting: Hematology & Oncology

## 2023-03-02 ENCOUNTER — Inpatient Hospital Stay: Payer: Medicare Other | Admitting: Hematology & Oncology

## 2023-03-02 ENCOUNTER — Inpatient Hospital Stay: Payer: Medicare Other

## 2023-03-08 ENCOUNTER — Other Ambulatory Visit: Payer: Self-pay | Admitting: Internal Medicine

## 2023-03-22 ENCOUNTER — Inpatient Hospital Stay: Payer: Medicare Other | Attending: Hematology & Oncology

## 2023-03-22 ENCOUNTER — Encounter: Payer: Self-pay | Admitting: Hematology & Oncology

## 2023-03-22 ENCOUNTER — Inpatient Hospital Stay: Payer: Medicare Other | Admitting: Hematology & Oncology

## 2023-03-22 ENCOUNTER — Encounter: Payer: Self-pay | Admitting: *Deleted

## 2023-03-22 VITALS — BP 120/71 | HR 86 | Temp 98.2°F | Resp 18 | Ht 62.0 in | Wt 108.0 lb

## 2023-03-22 DIAGNOSIS — C50911 Malignant neoplasm of unspecified site of right female breast: Secondary | ICD-10-CM

## 2023-03-22 DIAGNOSIS — C50211 Malignant neoplasm of upper-inner quadrant of right female breast: Secondary | ICD-10-CM | POA: Diagnosis not present

## 2023-03-22 DIAGNOSIS — Z79811 Long term (current) use of aromatase inhibitors: Secondary | ICD-10-CM | POA: Diagnosis not present

## 2023-03-22 DIAGNOSIS — R300 Dysuria: Secondary | ICD-10-CM

## 2023-03-22 DIAGNOSIS — Z1722 Progesterone receptor negative status: Secondary | ICD-10-CM

## 2023-03-22 DIAGNOSIS — Z8616 Personal history of COVID-19: Secondary | ICD-10-CM | POA: Insufficient documentation

## 2023-03-22 LAB — CMP (CANCER CENTER ONLY)
ALT: 15 U/L (ref 0–44)
AST: 16 U/L (ref 15–41)
Albumin: 4.4 g/dL (ref 3.5–5.0)
Alkaline Phosphatase: 56 U/L (ref 38–126)
Anion gap: 9 (ref 5–15)
BUN: 30 mg/dL — ABNORMAL HIGH (ref 8–23)
CO2: 30 mmol/L (ref 22–32)
Calcium: 9.7 mg/dL (ref 8.9–10.3)
Chloride: 102 mmol/L (ref 98–111)
Creatinine: 0.88 mg/dL (ref 0.44–1.00)
GFR, Estimated: >60 mL/min (ref >60)
Glucose, Bld: 119 mg/dL — ABNORMAL HIGH (ref 70–99)
Potassium: 4 mmol/L (ref 3.5–5.1)
Sodium: 141 mmol/L (ref 135–145)
Total Bilirubin: 0.6 mg/dL (ref 0.0–1.2)
Total Protein: 7 g/dL (ref 6.5–8.1)

## 2023-03-22 LAB — LACTATE DEHYDROGENASE: LDH: 158 U/L (ref 98–192)

## 2023-03-22 LAB — CBC WITH DIFFERENTIAL (CANCER CENTER ONLY)
Abs Immature Granulocytes: 0.02 10*3/uL (ref 0.00–0.07)
Basophils Absolute: 0 10*3/uL (ref 0.0–0.1)
Basophils Relative: 0 %
Eosinophils Absolute: 0.1 10*3/uL (ref 0.0–0.5)
Eosinophils Relative: 2 %
HCT: 40.7 % (ref 36.0–46.0)
Hemoglobin: 13.3 g/dL (ref 12.0–15.0)
Immature Granulocytes: 0 %
Lymphocytes Relative: 13 %
Lymphs Abs: 1.1 10*3/uL (ref 0.7–4.0)
MCH: 30.2 pg (ref 26.0–34.0)
MCHC: 32.7 g/dL (ref 30.0–36.0)
MCV: 92.5 fL (ref 80.0–100.0)
Monocytes Absolute: 0.6 10*3/uL (ref 0.1–1.0)
Monocytes Relative: 7 %
Neutro Abs: 6.5 10*3/uL (ref 1.7–7.7)
Neutrophils Relative %: 78 %
Platelet Count: 216 10*3/uL (ref 150–400)
RBC: 4.4 MIL/uL (ref 3.87–5.11)
RDW: 12.6 % (ref 11.5–15.5)
WBC Count: 8.4 10*3/uL (ref 4.0–10.5)
nRBC: 0 % (ref 0.0–0.2)

## 2023-03-22 NOTE — Progress Notes (Signed)
Hematology and Oncology Follow Up Visit  Lindsay Maldonado [id number] 10/09/1934 89 y.o. 03/22/2023   Principle Diagnosis:  Multifocal invasive ductal/lobular carcinoma of the right breast-stage I --Oncotype score of 9 and 16  Current Therapy:   Femara 2.5 mg p.o. daily-start on 10/22/2022     Interim History:  Lindsay Maldonado is back for follow-up.  Unfortunately, she had COVID over the Christmas holiday.  Thankfully, she was not hospitalized.  She took no treatment for.  After 5 days, she said that she got better.  She is doing well with the Femara.  She has had no problems with the Femara.  There is been no arthralgias or myalgias.  There has been no problems with nausea or vomiting.  She has had no change in bowel or bladder habits.  She may have little constipation.  There is been no bleeding.  She has had no cough or shortness of breath.  She had little bit of congestion but not too bad with the COVID.  Is been no leg swelling.  Overall, I would say that her performance status is probably ECOG 1.   Medications:  Current Outpatient Medications:    Cholecalciferol (VITAMIN D3) 50 MCG (2000 UT) capsule, Take 1 capsule (2,000 Units total) by mouth daily., Disp: 100 capsule, Rfl: 3   letrozole (FEMARA) 2.5 MG tablet, Take 1 tablet (2.5 mg total) by mouth daily., Disp: 30 tablet, Rfl: 12   LORazepam (ATIVAN) 1 MG tablet, TAKE ONE TABLET BY MOUTH TWICE DAILY as needed for anxiety or sleep, Disp: 60 tablet, Rfl: 3   Melatonin 5 MG CAPS, Take 5 mg by mouth at bedtime as needed., Disp: , Rfl:    SYNTHROID 75 MCG tablet, take one tablet by mouth daily , annual appt due in feb must see provider for future refills, Disp: 90 tablet, Rfl: 0   triamterene-hydrochlorothiazide (MAXZIDE-25) 37.5-25 MG tablet, TAKE HALF TABLET BY MOUTH DAILY, Disp: 45 tablet, Rfl: 3  Allergies:  Allergies  Allergen Reactions   Morphine Nausea And Vomiting    Past Medical History, Surgical history, Social history,  and Family History were reviewed and updated.  Review of Systems: Review of Systems  Constitutional: Negative.   HENT:  Negative.    Eyes: Negative.   Respiratory: Negative.    Cardiovascular: Negative.   Gastrointestinal: Negative.   Endocrine: Negative.   Genitourinary: Negative.    Musculoskeletal: Negative.   Skin: Negative.   Neurological: Negative.   Hematological: Negative.   Psychiatric/Behavioral: Negative.      Physical Exam:  height is 5\' 2"  (1.575 m) and weight is 108 lb (49 kg). Her oral temperature is 98.2 F (36.8 C). Her blood pressure is 120/71 and her pulse is 86. Her respiration is 18 and oxygen saturation is 100%.   Wt Readings from Last 3 Encounters:  03/22/23 108 lb (49 kg)  11/28/22 122 lb (55.3 kg)  10/17/22 122 lb (55.3 kg)    Physical Exam Vitals reviewed.  Constitutional:      Comments: Breast exam send left breast with no masses, edema or erythema.  There is no left axillary adenopathy.  Her right breast shows a healing lumpectomy scar at about the 1 o'clock position.  There is no masses in the right breast.  There is no right axillary adenopathy.  HENT:     Head: Normocephalic and atraumatic.  Eyes:     Pupils: Pupils are equal, round, and reactive to light.  Cardiovascular:     Rate and  Rhythm: Normal rate and regular rhythm.     Heart sounds: Normal heart sounds.  Pulmonary:     Effort: Pulmonary effort is normal.     Breath sounds: Normal breath sounds.  Abdominal:     General: Bowel sounds are normal.     Palpations: Abdomen is soft.  Musculoskeletal:        General: No tenderness or deformity. Normal range of motion.     Cervical back: Normal range of motion.  Lymphadenopathy:     Cervical: No cervical adenopathy.  Skin:    General: Skin is warm and dry.     Findings: No erythema or rash.  Neurological:     Mental Status: She is alert and oriented to person, place, and time.  Psychiatric:        Behavior: Behavior normal.         Thought Content: Thought content normal.        Judgment: Judgment normal.      Lab Results  Component Value Date   WBC 8.4 03/22/2023   HGB 13.3 03/22/2023   HCT 40.7 03/22/2023   MCV 92.5 03/22/2023   PLT 216 03/22/2023     Chemistry      Component Value Date/Time   NA 138 11/28/2022 1004   K 4.8 11/28/2022 1004   CL 99 11/28/2022 1004   CO2 32 11/28/2022 1004   BUN 13 11/28/2022 1004   CREATININE 0.75 11/28/2022 1004   CREATININE 0.89 (H) 09/11/2019 0940      Component Value Date/Time   CALCIUM 9.7 11/28/2022 1004   ALKPHOS 59 11/28/2022 1004   AST 18 11/28/2022 1004   ALT 19 11/28/2022 1004   BILITOT 0.5 11/28/2022 1004       Impression and Plan: Lindsay Maldonado is a very nice 89 year old postmenopausal white female.  She has a multifocal invasive carcinoma of the right breast.  These are both low-grade.  They are both early stage I.  They have low Oncotype scores.  She is on Femara.  I think he is doing well with the Femara.  For right now, we will plan to get her back in 3 months.  Will get her through the Winter.  I really do not think that recurrent cancer can be a problem for her.  However, this always needs to be considered.   Josph Macho, MD 1/30/20253:18 PM

## 2023-03-23 ENCOUNTER — Encounter: Payer: Self-pay | Admitting: *Deleted

## 2023-03-23 NOTE — Progress Notes (Signed)
Patient is doing well and is tolerating her AI. No navigational needs for some time. Will discontinue active navigation at this time, but be available as needed in the future.   Oncology Nurse Navigator Documentation     03/22/2023    3:00 PM  Oncology Nurse Navigator Flowsheets  Navigation Complete Date: 03/22/2023  Post Navigation: Continue to Follow Patient? No  Reason Not Navigating Patient: Patient On Maintenance Chemotherapy  Navigator Location [facility]  Navigator Encounter Type Follow-up Appt  Patient Visit Type MedOnc  Treatment Phase Active Tx  Barriers/Navigation Needs No Barriers At This Time  Interventions None Required  Acuity Level 1-No Barriers  Support Groups/Services Friends and Family  Time Spent with Patient 15

## 2023-04-18 ENCOUNTER — Encounter: Payer: Medicare Other | Admitting: Internal Medicine

## 2023-04-18 DIAGNOSIS — K080 Exfoliation of teeth due to systemic causes: Secondary | ICD-10-CM | POA: Diagnosis not present

## 2023-04-20 DIAGNOSIS — Z131 Encounter for screening for diabetes mellitus: Secondary | ICD-10-CM | POA: Diagnosis not present

## 2023-04-20 DIAGNOSIS — I10 Essential (primary) hypertension: Secondary | ICD-10-CM | POA: Diagnosis not present

## 2023-04-20 DIAGNOSIS — C50919 Malignant neoplasm of unspecified site of unspecified female breast: Secondary | ICD-10-CM | POA: Diagnosis not present

## 2023-04-20 DIAGNOSIS — G4700 Insomnia, unspecified: Secondary | ICD-10-CM | POA: Diagnosis not present

## 2023-04-20 DIAGNOSIS — E039 Hypothyroidism, unspecified: Secondary | ICD-10-CM | POA: Diagnosis not present

## 2023-04-20 DIAGNOSIS — E559 Vitamin D deficiency, unspecified: Secondary | ICD-10-CM | POA: Diagnosis not present

## 2023-05-21 DIAGNOSIS — K080 Exfoliation of teeth due to systemic causes: Secondary | ICD-10-CM | POA: Diagnosis not present

## 2023-05-28 ENCOUNTER — Telehealth: Payer: Self-pay | Admitting: Internal Medicine

## 2023-05-28 NOTE — Telephone Encounter (Signed)
 Contacted Tor Netters to schedule their annual wellness visit. Patient declined to schedule AWV at this time.Transferred care to [facility], Clelia Croft.  [facility] Care Guide [facility] AWV TEAM Direct Dial: [phone]

## 2023-05-29 NOTE — Telephone Encounter (Signed)
 Noted! Thank you

## 2023-06-19 ENCOUNTER — Encounter: Payer: Self-pay | Admitting: Hematology & Oncology

## 2023-06-19 ENCOUNTER — Inpatient Hospital Stay: Payer: Medicare Other | Admitting: Hematology & Oncology

## 2023-06-19 ENCOUNTER — Other Ambulatory Visit: Payer: Self-pay

## 2023-06-19 ENCOUNTER — Inpatient Hospital Stay: Payer: Medicare Other | Attending: Hematology & Oncology

## 2023-06-19 VITALS — BP 167/71 | HR 72 | Temp 98.7°F | Resp 16 | Wt 120.0 lb

## 2023-06-19 DIAGNOSIS — Z1722 Progesterone receptor negative status: Secondary | ICD-10-CM

## 2023-06-19 DIAGNOSIS — C50911 Malignant neoplasm of unspecified site of right female breast: Secondary | ICD-10-CM | POA: Diagnosis not present

## 2023-06-19 DIAGNOSIS — C50211 Malignant neoplasm of upper-inner quadrant of right female breast: Secondary | ICD-10-CM | POA: Diagnosis not present

## 2023-06-19 DIAGNOSIS — Z171 Estrogen receptor negative status [ER-]: Secondary | ICD-10-CM | POA: Insufficient documentation

## 2023-06-19 DIAGNOSIS — Z79811 Long term (current) use of aromatase inhibitors: Secondary | ICD-10-CM | POA: Diagnosis not present

## 2023-06-19 LAB — CMP (CANCER CENTER ONLY)
ALT: 13 U/L (ref 0–44)
AST: 15 U/L (ref 15–41)
Albumin: 3.3 g/dL — ABNORMAL LOW (ref 3.5–5.0)
Alkaline Phosphatase: 49 U/L (ref 38–126)
Anion gap: 9 (ref 5–15)
BUN: 10 mg/dL (ref 8–23)
CO2: 29 mmol/L (ref 22–32)
Calcium: 9.2 mg/dL (ref 8.9–10.3)
Chloride: 104 mmol/L (ref 98–111)
Creatinine: 0.83 mg/dL (ref 0.44–1.00)
GFR, Estimated: >60 mL/min (ref >60)
Glucose, Bld: 58 mg/dL — ABNORMAL LOW (ref 70–99)
Potassium: 4.1 mmol/L (ref 3.5–5.1)
Sodium: 142 mmol/L (ref 135–145)
Total Bilirubin: 0.5 mg/dL (ref 0.0–1.2)
Total Protein: 6.7 g/dL (ref 6.5–8.1)

## 2023-06-19 LAB — CBC WITH DIFFERENTIAL (CANCER CENTER ONLY)
Abs Immature Granulocytes: 0.03 10*3/uL (ref 0.00–0.07)
Basophils Absolute: 0 10*3/uL (ref 0.0–0.1)
Basophils Relative: 0 %
Eosinophils Absolute: 0.2 10*3/uL (ref 0.0–0.5)
Eosinophils Relative: 4 %
HCT: 43.2 % (ref 36.0–46.0)
Hemoglobin: 14.3 g/dL (ref 12.0–15.0)
Immature Granulocytes: 1 %
Lymphocytes Relative: 23 %
Lymphs Abs: 1 10*3/uL (ref 0.7–4.0)
MCH: 30.8 pg (ref 26.0–34.0)
MCHC: 33.1 g/dL (ref 30.0–36.0)
MCV: 92.9 fL (ref 80.0–100.0)
Monocytes Absolute: 0.4 10*3/uL (ref 0.1–1.0)
Monocytes Relative: 10 %
Neutro Abs: 2.6 10*3/uL (ref 1.7–7.7)
Neutrophils Relative %: 62 %
Platelet Count: 232 10*3/uL (ref 150–400)
RBC: 4.65 MIL/uL (ref 3.87–5.11)
RDW: 13 % (ref 11.5–15.5)
WBC Count: 4.2 10*3/uL (ref 4.0–10.5)
nRBC: 0 % (ref 0.0–0.2)

## 2023-06-19 LAB — LACTATE DEHYDROGENASE: LDH: 152 U/L (ref 98–192)

## 2023-06-19 NOTE — Progress Notes (Signed)
 Hematology and Oncology Follow Up Visit  Lindsay Maldonado [id number] Apr 16, 1934 89 y.o. 06/19/2023   Principle Diagnosis:  Multifocal invasive ductal/lobular carcinoma of the right breast-stage I --Oncotype score of 9 and 16  Current Therapy:   Femara  2.5 mg p.o. daily-start on 10/22/2022     Interim History:  Lindsay Maldonado is back for follow-up.  He is doing quite well.  She really has no complaints since we last saw her back in January.  She needs to take a full dose Maxide.  Her blood pressure has been on the high side.  She has had no problems with the Femara .  She has had no arthralgias or myalgias.  She has had no issues with nausea or vomiting.  She has had no change in bowel or bladder habits.  There is been no leg swelling.  She has had no bleeding.  She has had no cough.  She comes in with one of her daughters.  It is always fun talking to both of them.  She apparently is going to have some teeth extracted in July.  I do not see a problem with this.  Overall, I would have said that her performance status is Lindsay Maldonado 1.   Medications:  Current Outpatient Medications:    Cholecalciferol (VITAMIN D3) 50 MCG (2000 UT) capsule, Take 1 capsule (2,000 Units total) by mouth daily., Disp: 100 capsule, Rfl: 3   letrozole  (FEMARA ) 2.5 MG tablet, Take 1 tablet (2.5 mg total) by mouth daily., Disp: 30 tablet, Rfl: 12   LORazepam  (ATIVAN ) 1 MG tablet, TAKE ONE TABLET BY MOUTH TWICE DAILY as needed for anxiety or sleep, Disp: 60 tablet, Rfl: 3   Melatonin 5 MG CAPS, Take 5 mg by mouth at bedtime as needed., Disp: , Rfl:    SYNTHROID  75 MCG tablet, take one tablet by mouth daily , annual appt due in feb must see provider for future refills, Disp: 90 tablet, Rfl: 0   triamterene -hydrochlorothiazide  (MAXZIDE-25) 37.5-25 MG tablet, TAKE HALF TABLET BY MOUTH DAILY, Disp: 45 tablet, Rfl: 3  Allergies:  Allergies  Allergen Reactions   Morphine Nausea And Vomiting    Past Medical History,  Surgical history, Social history, and Family History were reviewed and updated.  Review of Systems: Review of Systems  Constitutional: Negative.   HENT:  Negative.    Eyes: Negative.   Respiratory: Negative.    Cardiovascular: Negative.   Gastrointestinal: Negative.   Endocrine: Negative.   Genitourinary: Negative.    Musculoskeletal: Negative.   Skin: Negative.   Neurological: Negative.   Hematological: Negative.   Psychiatric/Behavioral: Negative.      Physical Exam:  weight is 120 lb (54.4 kg). Her oral temperature is 98.7 F (37.1 C). Her blood pressure is 167/71 (abnormal) and her pulse is 72. Her respiration is 16 and oxygen saturation is 98%.   Wt Readings from Last 3 Encounters:  06/19/23 120 lb (54.4 kg)  03/22/23 108 lb (49 kg)  11/28/22 122 lb (55.3 kg)    Physical Exam Vitals reviewed.  Constitutional:      Comments: Breast exam send left breast with no masses, edema or erythema.  There is no left axillary adenopathy.  Her right breast shows a healing lumpectomy scar at about the 1 o'clock position.  There is no masses in the right breast.  There is no right axillary adenopathy.  HENT:     Head: Normocephalic and atraumatic.  Eyes:     Pupils: Pupils are equal, round, and  reactive to light.  Cardiovascular:     Rate and Rhythm: Normal rate and regular rhythm.     Heart sounds: Normal heart sounds.  Pulmonary:     Effort: Pulmonary effort is normal.     Breath sounds: Normal breath sounds.  Abdominal:     General: Bowel sounds are normal.     Palpations: Abdomen is soft.  Musculoskeletal:        General: No tenderness or deformity. Normal range of motion.     Cervical back: Normal range of motion.  Lymphadenopathy:     Cervical: No cervical adenopathy.  Skin:    General: Skin is warm and dry.     Findings: No erythema or rash.  Neurological:     Mental Status: She is alert and oriented to person, place, and time.  Psychiatric:        Behavior:  Behavior normal.        Thought Content: Thought content normal.        Judgment: Judgment normal.      Lab Results  Component Value Date   WBC 4.2 06/19/2023   HGB 14.3 06/19/2023   HCT 43.2 06/19/2023   MCV 92.9 06/19/2023   PLT 232 06/19/2023     Chemistry      Component Value Date/Time   NA 142 06/19/2023 1039   K 4.1 06/19/2023 1039   CL 104 06/19/2023 1039   CO2 29 06/19/2023 1039   BUN 10 06/19/2023 1039   CREATININE 0.83 06/19/2023 1039   CREATININE 0.89 (H) 09/11/2019 0940      Component Value Date/Time   CALCIUM 9.2 06/19/2023 1039   ALKPHOS 49 06/19/2023 1039   AST 15 06/19/2023 1039   ALT 13 06/19/2023 1039   BILITOT 0.5 06/19/2023 1039       Impression and Plan: Lindsay Maldonado is a very nice 89 year old postmenopausal white female.  She has a multifocal invasive carcinoma of the right breast.  These are both low-grade.  They are both early stage I.  They have low Oncotype scores.  She is on Femara .  I think she is doing well with the Femara .  At this point, we will try to move her appointments out a little bit longer.  I would like to see her back in August.  Will get her through most of Summer.   Ivor Mars, MD 4/29/202512:02 PM

## 2023-06-20 ENCOUNTER — Encounter: Payer: Self-pay | Admitting: Dietician

## 2023-06-20 LAB — CANCER ANTIGEN 27.29: CA 27.29: 15.4 U/mL (ref 0.0–38.6)

## 2023-06-20 NOTE — Progress Notes (Signed)
 Met patient at the Nutrition Open House and Tasting event yesterday.  She relayed she is still having concerns with constipation.  I resent the tip sheet for constipation to her email with my contact information and offered a remote consult if she wanted to talk in more detail. Carleen Chary, RDN, LDN Registered Dietitian, [facility] Part Time Remote (Usual office hours: Tuesday-Thursday) Mobile: [phone] Remote Office: [phone]

## 2023-07-12 DIAGNOSIS — E162 Hypoglycemia, unspecified: Secondary | ICD-10-CM | POA: Diagnosis not present

## 2023-07-17 ENCOUNTER — Other Ambulatory Visit: Payer: Self-pay

## 2023-07-17 MED ORDER — LETROZOLE 2.5 MG PO TABS: 2.5000 mg | ORAL_TABLET | Freq: Every day | ORAL | 0 refills | Status: DC

## 2023-07-17 NOTE — Telephone Encounter (Signed)
 Patient called stating she is on vacation and forgot her letrozole  at home and is requesting a prescription sent to [facility] in [facility] for while she is there. Prescription sent.

## 2023-07-19 ENCOUNTER — Encounter: Payer: Self-pay | Admitting: Hematology & Oncology

## 2023-08-03 DIAGNOSIS — K080 Exfoliation of teeth due to systemic causes: Secondary | ICD-10-CM | POA: Diagnosis not present

## 2023-08-15 DIAGNOSIS — K5900 Constipation, unspecified: Secondary | ICD-10-CM | POA: Diagnosis not present

## 2023-08-15 DIAGNOSIS — C50919 Malignant neoplasm of unspecified site of unspecified female breast: Secondary | ICD-10-CM | POA: Diagnosis not present

## 2023-08-15 DIAGNOSIS — G629 Polyneuropathy, unspecified: Secondary | ICD-10-CM | POA: Diagnosis not present

## 2023-10-05 ENCOUNTER — Other Ambulatory Visit: Payer: Self-pay | Admitting: Family Medicine

## 2023-10-05 DIAGNOSIS — Z853 Personal history of malignant neoplasm of breast: Secondary | ICD-10-CM

## 2023-10-12 ENCOUNTER — Ambulatory Visit
Admission: RE | Admit: 2023-10-12 | Discharge: 2023-10-12 | Disposition: A | Discharge status: Home / Self Care | Source: Ambulatory Visit | Attending: Family Medicine | Admitting: Family Medicine

## 2023-10-12 DIAGNOSIS — Z853 Personal history of malignant neoplasm of breast: Secondary | ICD-10-CM

## 2023-10-12 DIAGNOSIS — R92323 Mammographic fibroglandular density, bilateral breasts: Secondary | ICD-10-CM | POA: Diagnosis not present

## 2023-10-12 DIAGNOSIS — R928 Other abnormal and inconclusive findings on diagnostic imaging of breast: Secondary | ICD-10-CM | POA: Diagnosis not present

## 2023-10-19 ENCOUNTER — Inpatient Hospital Stay

## 2023-10-19 ENCOUNTER — Ambulatory Visit: Admitting: Hematology & Oncology

## 2023-10-19 DIAGNOSIS — G4700 Insomnia, unspecified: Secondary | ICD-10-CM | POA: Diagnosis not present

## 2023-10-19 DIAGNOSIS — Z1331 Encounter for screening for depression: Secondary | ICD-10-CM | POA: Diagnosis not present

## 2023-10-19 DIAGNOSIS — C50919 Malignant neoplasm of unspecified site of unspecified female breast: Secondary | ICD-10-CM | POA: Diagnosis not present

## 2023-10-19 DIAGNOSIS — I10 Essential (primary) hypertension: Secondary | ICD-10-CM | POA: Diagnosis not present

## 2023-10-19 DIAGNOSIS — Z0000 Encounter for general adult medical examination without abnormal findings: Secondary | ICD-10-CM | POA: Diagnosis not present

## 2023-10-19 DIAGNOSIS — E039 Hypothyroidism, unspecified: Secondary | ICD-10-CM | POA: Diagnosis not present

## 2023-10-19 DIAGNOSIS — E559 Vitamin D deficiency, unspecified: Secondary | ICD-10-CM | POA: Diagnosis not present

## 2023-10-25 ENCOUNTER — Encounter: Payer: Self-pay | Admitting: *Deleted

## 2023-10-26 ENCOUNTER — Inpatient Hospital Stay: Attending: Hematology & Oncology

## 2023-10-26 ENCOUNTER — Inpatient Hospital Stay (HOSPITAL_BASED_OUTPATIENT_CLINIC_OR_DEPARTMENT_OTHER): Admitting: Hematology & Oncology

## 2023-10-26 ENCOUNTER — Encounter: Payer: Self-pay | Admitting: Hematology & Oncology

## 2023-10-26 VITALS — BP 134/72 | HR 66 | Temp 97.9°F | Resp 18 | Ht 62.0 in | Wt 119.4 lb

## 2023-10-26 DIAGNOSIS — Z1722 Progesterone receptor negative status: Secondary | ICD-10-CM | POA: Diagnosis not present

## 2023-10-26 DIAGNOSIS — C50911 Malignant neoplasm of unspecified site of right female breast: Secondary | ICD-10-CM | POA: Diagnosis not present

## 2023-10-26 DIAGNOSIS — Z79811 Long term (current) use of aromatase inhibitors: Secondary | ICD-10-CM | POA: Diagnosis not present

## 2023-10-26 DIAGNOSIS — C50211 Malignant neoplasm of upper-inner quadrant of right female breast: Secondary | ICD-10-CM | POA: Insufficient documentation

## 2023-10-26 LAB — CMP (CANCER CENTER ONLY)
ALT: 22 U/L (ref 0–44)
AST: 24 U/L (ref 15–41)
Albumin: 4.4 g/dL (ref 3.5–5.0)
Alkaline Phosphatase: 76 U/L (ref 38–126)
Anion gap: 10 (ref 5–15)
BUN: 16 mg/dL (ref 8–23)
CO2: 28 mmol/L (ref 22–32)
Calcium: 10 mg/dL (ref 8.9–10.3)
Chloride: 103 mmol/L (ref 98–111)
Creatinine: 0.78 mg/dL (ref 0.44–1.00)
GFR, Estimated: >60 mL/min (ref >60)
Glucose, Bld: 102 mg/dL — ABNORMAL HIGH (ref 70–99)
Potassium: 4.8 mmol/L (ref 3.5–5.1)
Sodium: 141 mmol/L (ref 135–145)
Total Bilirubin: 0.4 mg/dL (ref 0.0–1.2)
Total Protein: 6.9 g/dL (ref 6.5–8.1)

## 2023-10-26 LAB — CBC WITH DIFFERENTIAL (CANCER CENTER ONLY)
Abs Immature Granulocytes: 0.01 K/uL (ref 0.00–0.07)
Basophils Absolute: 0 K/uL (ref 0.0–0.1)
Basophils Relative: 1 %
Eosinophils Absolute: 0.1 K/uL (ref 0.0–0.5)
Eosinophils Relative: 3 %
HCT: 42.9 % (ref 36.0–46.0)
Hemoglobin: 13.7 g/dL (ref 12.0–15.0)
Immature Granulocytes: 0 %
Lymphocytes Relative: 24 %
Lymphs Abs: 1 K/uL (ref 0.7–4.0)
MCH: 29.1 pg (ref 26.0–34.0)
MCHC: 31.9 g/dL (ref 30.0–36.0)
MCV: 91.3 fL (ref 80.0–100.0)
Monocytes Absolute: 0.4 K/uL (ref 0.1–1.0)
Monocytes Relative: 9 %
Neutro Abs: 2.6 K/uL (ref 1.7–7.7)
Neutrophils Relative %: 63 %
Platelet Count: 205 K/uL (ref 150–400)
RBC: 4.7 MIL/uL (ref 3.87–5.11)
RDW: 12.6 % (ref 11.5–15.5)
WBC Count: 4.1 K/uL (ref 4.0–10.5)
nRBC: 0 % (ref 0.0–0.2)

## 2023-10-26 NOTE — Progress Notes (Signed)
 He Hematology and Oncology Follow Up Visit  Lindsay Maldonado [id number] 11/16/1934 89 y.o. 10/26/2023   Principle Diagnosis:  Multifocal invasive ductal/lobular carcinoma of the right breast-stage I --Oncotype score of 9 and 16  Current Therapy:   Femara  2.5 mg p.o. daily-start on 10/22/2022     Interim History:  Lindsay Maldonado is back for follow-up.  She comes in with a daughter.  She has been very busy since we last saw her.  She has had wedding.  She had a big wedding in [place].  She had a wonderful time out in [place].  She really has had no complaints at all.  She has had no problems with the Femara .  She has had no bony aches or pains.  She has had no change in bowel or bladder habits.  She has had no rashes.  There is been no leg swelling.  She has had no fever.  Overall, I would say that her performance status is probably ECOG 1.    Medications:  Current Outpatient Medications:    Cholecalciferol (VITAMIN D3) 50 MCG (2000 UT) capsule, Take 1 capsule (2,000 Units total) by mouth daily., Disp: 100 capsule, Rfl: 3   letrozole  (FEMARA ) 2.5 MG tablet, Take 1 tablet (2.5 mg total) by mouth daily., Disp: 10 tablet, Rfl: 0   LORazepam  (ATIVAN ) 1 MG tablet, TAKE ONE TABLET BY MOUTH TWICE DAILY as needed for anxiety or sleep, Disp: 60 tablet, Rfl: 3   Melatonin 5 MG CAPS, Take 5 mg by mouth at bedtime as needed., Disp: , Rfl:    polyethylene glycol powder (MIRALAX) 17 GM/SCOOP powder, Take 17 g by mouth daily., Disp: , Rfl:    SYNTHROID  75 MCG tablet, take one tablet by mouth daily , annual appt due in feb must see provider for future refills, Disp: 90 tablet, Rfl: 0   triamterene -hydrochlorothiazide  (MAXZIDE-25) 37.5-25 MG tablet, TAKE HALF TABLET BY MOUTH DAILY, Disp: 45 tablet, Rfl: 3  Allergies:  Allergies  Allergen Reactions   Morphine Nausea And Vomiting    Past Medical History, Surgical history, Social history, and Family History were reviewed and updated.  Review of  Systems: Review of Systems  Constitutional: Negative.   HENT:  Negative.    Eyes: Negative.   Respiratory: Negative.    Cardiovascular: Negative.   Gastrointestinal: Negative.   Endocrine: Negative.   Genitourinary: Negative.    Musculoskeletal: Negative.   Skin: Negative.   Neurological: Negative.   Hematological: Negative.   Psychiatric/Behavioral: Negative.      Physical Exam:  height is 5' 2 (1.575 m) and weight is 119 lb 6.4 oz (54.2 kg). Her oral temperature is 97.9 F (36.6 C). Her blood pressure is 134/72 and her pulse is 66. Her respiration is 18 and oxygen saturation is 100%.   Wt Readings from Last 3 Encounters:  10/26/23 119 lb 6.4 oz (54.2 kg)  06/19/23 120 lb (54.4 kg)  03/22/23 108 lb (49 kg)    Physical Exam Vitals reviewed.  Constitutional:      Comments: Breast exam send left breast with no masses, edema or erythema.  There is no left axillary adenopathy.  Her right breast shows a healing lumpectomy scar at about the 1 o'clock position.  There is no masses in the right breast.  There is no right axillary adenopathy.  HENT:     Head: Normocephalic and atraumatic.  Eyes:     Pupils: Pupils are equal, round, and reactive to light.  Cardiovascular:  Rate and Rhythm: Normal rate and regular rhythm.     Heart sounds: Normal heart sounds.  Pulmonary:     Effort: Pulmonary effort is normal.     Breath sounds: Normal breath sounds.  Abdominal:     General: Bowel sounds are normal.     Palpations: Abdomen is soft.  Musculoskeletal:        General: No tenderness or deformity. Normal range of motion.     Cervical back: Normal range of motion.  Lymphadenopathy:     Cervical: No cervical adenopathy.  Skin:    General: Skin is warm and dry.     Findings: No erythema or rash.  Neurological:     Mental Status: She is alert and oriented to person, place, and time.  Psychiatric:        Behavior: Behavior normal.        Thought Content: Thought content  normal.        Judgment: Judgment normal.      Lab Results  Component Value Date   WBC 4.1 10/26/2023   HGB 13.7 10/26/2023   HCT 42.9 10/26/2023   MCV 91.3 10/26/2023   PLT 205 10/26/2023     Chemistry      Component Value Date/Time   NA 141 10/26/2023 1001   K 4.8 10/26/2023 1001   CL 103 10/26/2023 1001   CO2 28 10/26/2023 1001   BUN 16 10/26/2023 1001   CREATININE 0.78 10/26/2023 1001   CREATININE 0.89 (H) 09/11/2019 0940      Component Value Date/Time   CALCIUM 10.0 10/26/2023 1001   ALKPHOS 76 10/26/2023 1001   AST 24 10/26/2023 1001   ALT 22 10/26/2023 1001   BILITOT 0.4 10/26/2023 1001       Impression and Plan: Lindsay Maldonado is a very nice 89 year old postmenopausal white female.  She has a multifocal invasive carcinoma of the right breast.  These are both low-grade.  They are both early stage I.  They have low Oncotype scores.  She is on Femara .  I think she is doing well with the Femara .  We will now get her through the Holiday season.  I will plan to see her back next year.  See Maude JONELLE Crease, MD 9/5/202511:23 AM

## 2023-11-26 ENCOUNTER — Other Ambulatory Visit: Payer: Self-pay | Admitting: Hematology & Oncology

## 2023-11-27 DIAGNOSIS — K080 Exfoliation of teeth due to systemic causes: Secondary | ICD-10-CM | POA: Diagnosis not present

## 2023-12-10 DIAGNOSIS — M810 Age-related osteoporosis without current pathological fracture: Secondary | ICD-10-CM | POA: Diagnosis not present

## 2023-12-13 DIAGNOSIS — H26493 Other secondary cataract, bilateral: Secondary | ICD-10-CM | POA: Diagnosis not present

## 2023-12-13 DIAGNOSIS — Z961 Presence of intraocular lens: Secondary | ICD-10-CM | POA: Diagnosis not present

## 2023-12-24 ENCOUNTER — Other Ambulatory Visit: Payer: Self-pay | Admitting: Hematology & Oncology

## 2024-01-11 ENCOUNTER — Encounter: Payer: Self-pay | Admitting: Hematology & Oncology

## 2024-01-21 ENCOUNTER — Other Ambulatory Visit: Payer: Self-pay | Admitting: Hematology & Oncology

## 2024-01-30 DIAGNOSIS — R0789 Other chest pain: Secondary | ICD-10-CM | POA: Diagnosis not present

## 2024-01-30 DIAGNOSIS — G629 Polyneuropathy, unspecified: Secondary | ICD-10-CM | POA: Diagnosis not present

## 2024-01-30 DIAGNOSIS — M810 Age-related osteoporosis without current pathological fracture: Secondary | ICD-10-CM | POA: Diagnosis not present

## 2024-02-26 ENCOUNTER — Other Ambulatory Visit: Payer: Self-pay | Admitting: Hematology & Oncology

## 2024-03-03 ENCOUNTER — Other Ambulatory Visit: Payer: Self-pay

## 2024-03-03 ENCOUNTER — Inpatient Hospital Stay: Admitting: Hematology & Oncology

## 2024-03-03 ENCOUNTER — Inpatient Hospital Stay: Attending: Hematology & Oncology

## 2024-03-03 ENCOUNTER — Encounter: Payer: Self-pay | Admitting: Hematology & Oncology

## 2024-03-03 VITALS — BP 125/68 | HR 70 | Temp 97.6°F | Resp 18 | Ht 62.0 in | Wt 116.0 lb

## 2024-03-03 DIAGNOSIS — Z1732 Human epidermal growth factor receptor 2 negative status: Secondary | ICD-10-CM | POA: Insufficient documentation

## 2024-03-03 DIAGNOSIS — Z1722 Progesterone receptor negative status: Secondary | ICD-10-CM

## 2024-03-03 DIAGNOSIS — C50211 Malignant neoplasm of upper-inner quadrant of right female breast: Secondary | ICD-10-CM | POA: Diagnosis present

## 2024-03-03 DIAGNOSIS — Z1721 Progesterone receptor positive status: Secondary | ICD-10-CM | POA: Insufficient documentation

## 2024-03-03 DIAGNOSIS — C50911 Malignant neoplasm of unspecified site of right female breast: Secondary | ICD-10-CM

## 2024-03-03 DIAGNOSIS — Z170 Estrogen receptor positive status [ER+]: Secondary | ICD-10-CM | POA: Insufficient documentation

## 2024-03-03 DIAGNOSIS — Z79811 Long term (current) use of aromatase inhibitors: Secondary | ICD-10-CM | POA: Insufficient documentation

## 2024-03-03 DIAGNOSIS — M810 Age-related osteoporosis without current pathological fracture: Secondary | ICD-10-CM | POA: Diagnosis not present

## 2024-03-03 LAB — CMP (CANCER CENTER ONLY)
ALT: 16 U/L (ref 0–44)
AST: 20 U/L (ref 15–41)
Albumin: 4.6 g/dL (ref 3.5–5.0)
Alkaline Phosphatase: 74 U/L (ref 38–126)
Anion gap: 12 (ref 5–15)
BUN: 18 mg/dL (ref 8–23)
CO2: 25 mmol/L (ref 22–32)
Calcium: 10.1 mg/dL (ref 8.9–10.3)
Chloride: 97 mmol/L — ABNORMAL LOW (ref 98–111)
Creatinine: 0.84 mg/dL (ref 0.44–1.00)
GFR, Estimated: >60 mL/min
Glucose, Bld: 113 mg/dL — ABNORMAL HIGH (ref 70–99)
Potassium: 3.8 mmol/L (ref 3.5–5.1)
Sodium: 134 mmol/L — ABNORMAL LOW (ref 135–145)
Total Bilirubin: 0.6 mg/dL (ref 0.0–1.2)
Total Protein: 7.2 g/dL (ref 6.5–8.1)

## 2024-03-03 LAB — CBC WITH DIFFERENTIAL (CANCER CENTER ONLY)
Abs Immature Granulocytes: 0.01 K/uL (ref 0.00–0.07)
Basophils Absolute: 0 K/uL (ref 0.0–0.1)
Basophils Relative: 0 %
Eosinophils Absolute: 0.2 K/uL (ref 0.0–0.5)
Eosinophils Relative: 3 %
HCT: 44.2 % (ref 36.0–46.0)
Hemoglobin: 14.4 g/dL (ref 12.0–15.0)
Immature Granulocytes: 0 %
Lymphocytes Relative: 15 %
Lymphs Abs: 0.8 K/uL (ref 0.7–4.0)
MCH: 29.3 pg (ref 26.0–34.0)
MCHC: 32.6 g/dL (ref 30.0–36.0)
MCV: 90 fL (ref 80.0–100.0)
Monocytes Absolute: 0.4 K/uL (ref 0.1–1.0)
Monocytes Relative: 8 %
Neutro Abs: 4.1 K/uL (ref 1.7–7.7)
Neutrophils Relative %: 74 %
Platelet Count: 213 K/uL (ref 150–400)
RBC: 4.91 MIL/uL (ref 3.87–5.11)
RDW: 12.3 % (ref 11.5–15.5)
WBC Count: 5.5 K/uL (ref 4.0–10.5)
nRBC: 0 % (ref 0.0–0.2)

## 2024-03-03 LAB — LACTATE DEHYDROGENASE: LDH: 166 U/L (ref 105–235)

## 2024-03-03 MED ORDER — LETROZOLE 2.5 MG PO TABS: 2.5000 mg | ORAL_TABLET | Freq: Every day | ORAL | 6 refills | Status: AC

## 2024-03-03 NOTE — Progress Notes (Signed)
 He Hematology and Oncology Follow Up Visit  Lindsay Maldonado [id number] 12-22-1934 89 y.o. 03/03/2024   Principle Diagnosis:  Multifocal invasive ductal/lobular carcinoma of the right breast-stage I --Oncotype score of 9 and 16  Current Therapy:   Femara  2.5 mg p.o. daily-start on 10/22/2022     Interim History:  Lindsay Maldonado is back for follow-up.  She comes in with a daughter.  The problem that she now has osteoporosis.  She had a bone density test done.  She is reluctant to take anything for this right now.  She is starting an exercise program which hopefully help.  Of note she is on Femara .  She will need to be on Femara  given that she had multifocal carcinoma.  I really think the Femara  will really help prevent recurrence.  I do not see any problem with her being on a bisphosphonate or a monoclonal antibody.  Overall, she is doing okay.  She had a weekend recently that she just did not feel all that well.  She has had no cough or shortness of breath.  She has had no nausea or vomiting.  She has had no change in bowel or bladder habits.  Overall, I will say that her performance status by ECOG 1.   Medications:  Current Outpatient Medications:    ascorbic acid (VITAMIN C) 500 MG tablet, Take 500 mg by mouth daily., Disp: , Rfl:    Calcium Carbonate (CALCIUM 500 PO), Take 1,000 mg by mouth daily at 12 noon., Disp: , Rfl:    Vitamin D -Vitamin K (VITAMIN K2-VITAMIN D3 PO), Take 1 capsule by mouth daily., Disp: , Rfl:    letrozole  (FEMARA ) 2.5 MG tablet, TAKE ONE TABLET BY MOUTH ONCE A DAY, Disp: 30 tablet, Rfl: 0   LORazepam  (ATIVAN ) 1 MG tablet, TAKE ONE TABLET BY MOUTH TWICE DAILY as needed for anxiety or sleep, Disp: 60 tablet, Rfl: 3   Melatonin 5 MG CAPS, Take 5 mg by mouth at bedtime as needed., Disp: , Rfl:    polyethylene glycol powder (MIRALAX) 17 GM/SCOOP powder, Take 17 g by mouth daily., Disp: , Rfl:    SYNTHROID  75 MCG tablet, take one tablet by mouth daily , annual appt  due in feb must see provider for future refills, Disp: 90 tablet, Rfl: 0   triamterene -hydrochlorothiazide  (MAXZIDE-25) 37.5-25 MG tablet, TAKE HALF TABLET BY MOUTH DAILY, Disp: 45 tablet, Rfl: 3  Allergies:  Allergies  Allergen Reactions   Morphine Nausea And Vomiting    Past Medical History, Surgical history, Social history, and Family History were reviewed and updated.  Review of Systems: Review of Systems  Constitutional: Negative.   HENT:  Negative.    Eyes: Negative.   Respiratory: Negative.    Cardiovascular: Negative.   Gastrointestinal: Negative.   Endocrine: Negative.   Genitourinary: Negative.    Musculoskeletal: Negative.   Skin: Negative.   Neurological: Negative.   Hematological: Negative.   Psychiatric/Behavioral: Negative.      Physical Exam:  height is 5' 2 (1.575 m) and weight is 116 lb (52.6 kg). Her oral temperature is 97.6 F (36.4 C). Her blood pressure is 125/68 and her pulse is 70. Her respiration is 18 and oxygen saturation is 100%.   Wt Readings from Last 3 Encounters:  03/03/24 116 lb (52.6 kg)  10/26/23 119 lb 6.4 oz (54.2 kg)  06/19/23 120 lb (54.4 kg)    Physical Exam Vitals reviewed.  Constitutional:      Comments: Breast exam send left  breast with no masses, edema or erythema.  There is no left axillary adenopathy.  Her right breast shows a healing lumpectomy scar at about the 1 o'clock position.  There is no masses in the right breast.  There is no right axillary adenopathy.  HENT:     Head: Normocephalic and atraumatic.  Eyes:     Pupils: Pupils are equal, round, and reactive to light.  Cardiovascular:     Rate and Rhythm: Normal rate and regular rhythm.     Heart sounds: Normal heart sounds.  Pulmonary:     Effort: Pulmonary effort is normal.     Breath sounds: Normal breath sounds.  Abdominal:     General: Bowel sounds are normal.     Palpations: Abdomen is soft.  Musculoskeletal:        General: No tenderness or deformity.  Normal range of motion.     Cervical back: Normal range of motion.  Lymphadenopathy:     Cervical: No cervical adenopathy.  Skin:    General: Skin is warm and dry.     Findings: No erythema or rash.  Neurological:     Mental Status: She is alert and oriented to person, place, and time.  Psychiatric:        Behavior: Behavior normal.        Thought Content: Thought content normal.        Judgment: Judgment normal.      Lab Results  Component Value Date   WBC 5.5 03/03/2024   HGB 14.4 03/03/2024   HCT 44.2 03/03/2024   MCV 90.0 03/03/2024   PLT 213 03/03/2024     Chemistry      Component Value Date/Time   NA 134 (L) 03/03/2024 1020   K 3.8 03/03/2024 1020   CL 97 (L) 03/03/2024 1020   CO2 25 03/03/2024 1020   BUN 18 03/03/2024 1020   CREATININE 0.84 03/03/2024 1020   CREATININE 0.89 (H) 09/11/2019 0940      Component Value Date/Time   CALCIUM 10.1 03/03/2024 1020   ALKPHOS 74 03/03/2024 1020   AST 20 03/03/2024 1020   ALT 16 03/03/2024 1020   BILITOT 0.6 03/03/2024 1020       Impression and Plan: Lindsay Maldonado is a very nice 89 year old postmenopausal white female.  She has a multifocal invasive carcinoma of the right breast.  These are both low-grade.  They are both early stage I.  They have Oncotype scores that are low enough that she would not require chemotherapy..  She is on Femara .  I think she is doing well with the Femara .  I really believe she is to be on Femara  for probably 7 years.  Again, I talked her about osteoporosis treatment.  I do not see a problem with her having treatment for her osteoporosis.    She will think about things.  I will plan to see her back in another 4 months or so.  Maude JONELLE Crease, MD 1/12/202611:14 AM

## 2024-07-01 ENCOUNTER — Inpatient Hospital Stay

## 2024-07-01 ENCOUNTER — Inpatient Hospital Stay: Admitting: Hematology & Oncology
